# Patient Record
Sex: Male | Born: 1945 | Race: White | Hispanic: No | Smoking: Former smoker
Health system: Southern US, Community
[De-identification: ages and names within clinical notes are randomized; demographics above are authoritative.]

## PROBLEM LIST (undated history)

## (undated) DIAGNOSIS — I10 Essential (primary) hypertension: Secondary | ICD-10-CM

## (undated) DIAGNOSIS — J449 Chronic obstructive pulmonary disease, unspecified: Secondary | ICD-10-CM

## (undated) DIAGNOSIS — M1990 Unspecified osteoarthritis, unspecified site: Secondary | ICD-10-CM

## (undated) DIAGNOSIS — I429 Cardiomyopathy, unspecified: Secondary | ICD-10-CM

## (undated) DIAGNOSIS — M109 Gout, unspecified: Secondary | ICD-10-CM

## (undated) DIAGNOSIS — I4891 Unspecified atrial fibrillation: Secondary | ICD-10-CM

## (undated) DIAGNOSIS — I509 Heart failure, unspecified: Secondary | ICD-10-CM

## (undated) HISTORY — PX: APPENDECTOMY (OPEN): SHX54

## (undated) HISTORY — DX: Unspecified atrial fibrillation: I48.91

## (undated) HISTORY — DX: Chronic obstructive pulmonary disease, unspecified: J44.9

## (undated) HISTORY — PX: LIFT, ARM (MEDICAL): SHX4702

## (undated) HISTORY — DX: Cardiomyopathy, unspecified: I42.9

## (undated) HISTORY — DX: Gout, unspecified: M10.9

## (undated) HISTORY — DX: Unspecified osteoarthritis, unspecified site: M19.90

## (undated) HISTORY — DX: Essential (primary) hypertension: I10

## (undated) HISTORY — PX: KNEE ARTHROSCOPY W/ ACL RECONSTRUCTION: SHX1858

## (undated) HISTORY — PX: KNEE ARTHROSCOPY: SUR90

## (undated) HISTORY — PX: COLONOSCOPY W/ POLYPECTOMY: SHX1380

## (undated) HISTORY — DX: Heart failure, unspecified: I50.9

## (undated) HISTORY — PX: HX APPENDECTOMY: SHX54

## (undated) HISTORY — PX: GASTROSCOPY: WVUENDOPRO49

---

## 1989-08-07 ENCOUNTER — Emergency Department: Admit: 1989-08-07 | Payer: Self-pay | Source: Ambulatory Visit

## 1990-06-17 ENCOUNTER — Emergency Department: Admit: 1990-06-17 | Disposition: A | Discharge status: Home / Self Care | Payer: Self-pay | Source: Ambulatory Visit

## 1990-11-10 ENCOUNTER — Ambulatory Visit: Admit: 1990-11-10 | Disposition: A | Discharge status: Home / Self Care | Payer: Self-pay | Source: Ambulatory Visit

## 1991-11-13 ENCOUNTER — Emergency Department: Admit: 1991-11-13 | Disposition: A | Discharge status: Home / Self Care | Payer: Self-pay | Source: Ambulatory Visit

## 1992-02-28 ENCOUNTER — Ambulatory Visit (INDEPENDENT_AMBULATORY_CARE_PROVIDER_SITE_OTHER): Admit: 1992-02-28 | Disposition: A | Discharge status: Home / Self Care | Payer: Self-pay | Source: Ambulatory Visit

## 1998-03-15 ENCOUNTER — Emergency Department: Admit: 1998-03-15 | Payer: Self-pay | Source: Emergency Department | Admitting: Emergency Medical Services

## 2000-09-05 ENCOUNTER — Inpatient Hospital Stay
Admission: EM | Admit: 2000-09-05 | Disposition: A | Discharge status: Home / Self Care | Payer: Self-pay | Source: Ambulatory Visit

## 2001-07-01 ENCOUNTER — Emergency Department
Admission: EM | Admit: 2001-07-01 | Disposition: A | Discharge status: Home / Self Care | Payer: Self-pay | Source: Ambulatory Visit

## 2007-10-08 ENCOUNTER — Emergency Department
Admission: EM | Admit: 2007-10-08 | Disposition: A | Discharge status: Home / Self Care | Payer: Self-pay | Source: Ambulatory Visit

## 2011-06-13 ENCOUNTER — Ambulatory Visit: Admit: 2011-06-13 | Disposition: A | Discharge status: Home / Self Care | Payer: Self-pay | Source: Ambulatory Visit

## 2012-12-16 ENCOUNTER — Ambulatory Visit
Admission: RE | Admit: 2012-12-16 | Discharge: 2012-12-16 | Disposition: A | Discharge status: Home / Self Care | Payer: Medicare PPO | Source: Ambulatory Visit | Attending: Gastroenterology | Admitting: Gastroenterology

## 2012-12-16 ENCOUNTER — Ambulatory Visit (HOSPITAL_BASED_OUTPATIENT_CLINIC_OR_DEPARTMENT_OTHER): Payer: Medicare PPO | Admitting: Gastroenterology

## 2012-12-16 ENCOUNTER — Encounter (HOSPITAL_BASED_OUTPATIENT_CLINIC_OR_DEPARTMENT_OTHER)
Admission: RE | Disposition: A | Discharge status: Home / Self Care | Payer: Self-pay | Source: Ambulatory Visit | Attending: Gastroenterology

## 2012-12-16 ENCOUNTER — Encounter (HOSPITAL_BASED_OUTPATIENT_CLINIC_OR_DEPARTMENT_OTHER): Payer: Self-pay | Admitting: Anesthesiology

## 2012-12-16 ENCOUNTER — Ambulatory Visit (HOSPITAL_BASED_OUTPATIENT_CLINIC_OR_DEPARTMENT_OTHER): Payer: Medicare PPO | Admitting: Anesthesiology

## 2012-12-16 ENCOUNTER — Encounter (HOSPITAL_BASED_OUTPATIENT_CLINIC_OR_DEPARTMENT_OTHER): Payer: Self-pay

## 2012-12-16 DIAGNOSIS — G8929 Other chronic pain: Secondary | ICD-10-CM | POA: Insufficient documentation

## 2012-12-16 DIAGNOSIS — K269 Duodenal ulcer, unspecified as acute or chronic, without hemorrhage or perforation: Secondary | ICD-10-CM | POA: Insufficient documentation

## 2012-12-16 DIAGNOSIS — Z9089 Acquired absence of other organs: Secondary | ICD-10-CM | POA: Insufficient documentation

## 2012-12-16 DIAGNOSIS — I10 Essential (primary) hypertension: Secondary | ICD-10-CM | POA: Insufficient documentation

## 2012-12-16 DIAGNOSIS — K648 Other hemorrhoids: Secondary | ICD-10-CM | POA: Insufficient documentation

## 2012-12-16 DIAGNOSIS — K2940 Chronic atrophic gastritis without bleeding: Secondary | ICD-10-CM | POA: Insufficient documentation

## 2012-12-16 DIAGNOSIS — R1013 Epigastric pain: Secondary | ICD-10-CM | POA: Insufficient documentation

## 2012-12-16 DIAGNOSIS — A048 Other specified bacterial intestinal infections: Secondary | ICD-10-CM | POA: Insufficient documentation

## 2012-12-16 DIAGNOSIS — Z1211 Encounter for screening for malignant neoplasm of colon: Secondary | ICD-10-CM | POA: Insufficient documentation

## 2012-12-16 DIAGNOSIS — Z79899 Other long term (current) drug therapy: Secondary | ICD-10-CM | POA: Insufficient documentation

## 2012-12-16 DIAGNOSIS — K2980 Duodenitis without bleeding: Secondary | ICD-10-CM | POA: Insufficient documentation

## 2012-12-16 DIAGNOSIS — F17200 Nicotine dependence, unspecified, uncomplicated: Secondary | ICD-10-CM | POA: Insufficient documentation

## 2012-12-16 DIAGNOSIS — D126 Benign neoplasm of colon, unspecified: Secondary | ICD-10-CM | POA: Insufficient documentation

## 2012-12-16 HISTORY — PX: COLONOSCOPY, POLYPECTOMY: SHX3473

## 2012-12-16 HISTORY — PX: EGD, BIOPSY: SHX3796

## 2012-12-16 SURGERY — COLONOSCOPY, REMOVAL OF TUMOR, POLYP, OR OTHER LESION BY SNARE TECHNIQUE
Anesthesia: Anesthesia MAC / Sedation | Site: Abdomen | Wound class: Clean Contaminated

## 2012-12-16 MED ORDER — PROPOFOL 10 MG/ML IV EMUL
INTRAVENOUS | Status: AC
Start: 2012-12-16 — End: ?
  Filled 2012-12-16: qty 20

## 2012-12-16 MED ORDER — FENTANYL CITRATE 0.05 MG/ML IJ SOLN
INTRAMUSCULAR | Status: AC
Start: 2012-12-16 — End: ?
  Filled 2012-12-16: qty 2

## 2012-12-16 MED ORDER — SODIUM CHLORIDE 0.9 % IV SOLN
INTRAVENOUS | Status: DC | PRN
Start: 2012-12-16 — End: 2012-12-16

## 2012-12-16 MED ORDER — SODIUM CHLORIDE 0.9 % IV SOLN
INTRAVENOUS | Status: DC
Start: 2012-12-16 — End: 2012-12-16

## 2012-12-16 MED ORDER — PROPOFOL 10 MG/ML IV EMUL
INTRAVENOUS | Status: DC | PRN
Start: 2012-12-16 — End: 2012-12-16
  Administered 2012-12-16 (×6): 30 mg via INTRAVENOUS
  Administered 2012-12-16: 50 mg via INTRAVENOUS
  Administered 2012-12-16 (×3): 30 mg via INTRAVENOUS
  Administered 2012-12-16: 50 mg via INTRAVENOUS
  Administered 2012-12-16: 30 mg via INTRAVENOUS

## 2012-12-16 MED ORDER — FENTANYL CITRATE 0.05 MG/ML IJ SOLN
INTRAMUSCULAR | Status: DC | PRN
Start: 2012-12-16 — End: 2012-12-16
  Administered 2012-12-16: 100 ug via INTRAVENOUS

## 2012-12-16 SURGICAL SUPPLY — 4 items
CONN IRRG TORRENT 1WAYVAL ENDO (Supply) ×2 IMPLANT
FORCEP BIOPSY RAD JAW 1333-40 (Supply) ×2 IMPLANT
FORCEP RADIAL JAW JUMBO 240CM (Supply) ×2 IMPLANT
TUBING IRRIGATION TORRENT (Supply) ×2 IMPLANT

## 2012-12-16 NOTE — Anesthesia Preprocedure Evaluation (Signed)
Anesthesia Evaluation    AIRWAY    Mallampati: II    TM distance: >3 FB  Neck ROM: full  Mouth Opening:full   CARDIOVASCULAR    cardiovascular exam normal, regular and normal       DENTAL         PULMONARY    pulmonary exam normal     OTHER FINDINGS    Missing and poor dentition                  Anesthesia Plan    ASA 2     MAC               (Goes by LB)      intravenous induction   Detailed anesthesia plan: MAC        Post op pain management: per surgeon    informed consent obtained

## 2012-12-16 NOTE — Final Progress Note (DC Note for stay less than 48 (Signed)
DISCHARGE NOTE:  Patient underwent the following endoscopic procedures with details of results recorded below:    PROCEDURE: COLONOSCOPY    IMPRESSION:  - Two 10 to 12 mm polyps in the sigmoid colon and in the descending colon.  Resected and retrieved.   - The examined portion of the ileum was normal.   - Three 7 to 10 mm polyps in the sigmoid colon, in the ascending colon and in the cecum.  Resected and retrieved.   - Two 3 to 4 mm polyps in the sigmoid colon.  Resected and retrieved.   - Non-bleeding internal hemorrhoids.      RECOMMENDATIONS:  - Await histopathology results  - If any of the polyps are adenomatous, then I recommend:   *Colonoscopy in 3-5 years   *Colonoscopy for all first degree family members, beginning at age 4   *If family members need access to colonoscopy services, call [phone],                     extension [phone] to speak to [place].   - If none of the polyps are adenomatous, then colonoscopy is needed in 10 years  - I will contact him in about 14 days with the histology results and final recommendations.   - Incidental diverticulosis was found. No therapy for this condition is needed.  Some providers suggest that adding fiber reduces future complications, but this is not supported by rigid scientific criteria.  I recommend fiber only if the patient has crampy abdominal pain.   - The hemorrhoids found on today's examination require no intervention or therapy.  - Upper GI endoscopy now      PROCEDURE: UPPER GI ENDOSCOPY    IMPRESSION:  - Normal 2nd part of the duodenum.  Biopsied.   - Multiple duodenal ulcers with clean base.   - Acute duodenitis.   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   - Remaining stomach.  Biopsied.   - Questionable early varicies?  - Normal esophagus otherwise    RECOMMENDATIONS:  - Discharge to home via wheelchair after meeting discharge criteria  - Await pathology results  - I will contact her via letter about the histopathology results.   - Consider PPI for  therapy of ulcer, if symptoms demand it  - No further evaluation is needed at this time.   - See me in my office on an as needed basis.  - Return to primary care/referring provider as previously arranged.       After a period of observation and recovery from sedation, the patient was discharged to home in stable condition.

## 2012-12-16 NOTE — Brief Op Note (Signed)
An image copy of the detailed note for this endoscopic procedure is located under "MEDIA" or possibly "OP NOTES" section with same date and time.    Below you will find a summary of impressions and plan as a ready reference:    PROCEDURE: UPPER GI ENDOSCOPY    IMPRESSION:  - Normal 2nd part of the duodenum.  Biopsied.   - Multiple duodenal ulcers with clean base.   - Acute duodenitis.   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   - Remaining stomach.  Biopsied.   - Questionable early varicies?  - Normal esophagus otherwise    RECOMMENDATIONS:  - Discharge to home via wheelchair after meeting discharge criteria  - Await pathology results  - I will contact her via letter about the histopathology results.   - Consider PPI for therapy of ulcer, if symptoms demand it  - No further evaluation is needed at this time.   - See me in my office on an as needed basis.  - Return to primary care/referring provider as previously arranged.

## 2012-12-16 NOTE — Transfer of Care (Signed)
Anesthesia Transfer of Care Note    Patient: Christopher Cousineau Sr.    Procedures performed: Procedure(s) with comments:  COLONOSCOPY, POLYPECTOMY  EGD, BIOPSY    Anesthesia type: MAC    Patient location:  Endo Recovery    Last vitals:   Filed Vitals:    12/16/12 0815   BP: 163/106   Pulse: 85   Temp:    Resp: 12   SpO2: 96%       Post pain: Patient not complaining of pain, continue current therapy     Mental Status:  awake    Respiratory Function: stable    Cardiovascular: stable    Nausea/Vomiting: patient not complaining of nausea or vomiting    Hydration Status: adequate    Post assessment: no apparent anesthetic complications, no reportable events and no evidence of recall

## 2012-12-16 NOTE — Brief Op Note (Signed)
An image copy of the detailed note for this endoscopic procedure is located under "MEDIA" or possibly "OP NOTES" section with same date and time.    Below you will find a summary of impressions and plan as a ready reference:    PROCEDURE: COLONOSCOPY    IMPRESSION:  - Two 10 to 12 mm polyps in the sigmoid colon and in the descending colon.  Resected and retrieved.   - The examined portion of the ileum was normal.   - Three 7 to 10 mm polyps in the sigmoid colon, in the ascending colon and in the cecum.  Resected and retrieved.   - Two 3 to 4 mm polyps in the sigmoid colon.  Resected and retrieved.   - Non-bleeding internal hemorrhoids.      RECOMMENDATIONS:  - Await histopathology results  - If any of the polyps are adenomatous, then I recommend:   *Colonoscopy in 3-5 years   *Colonoscopy for all first degree family members, beginning at age 22   *If family members need access to colonoscopy services, call [phone],                     extension [phone] to speak to [place].   - If none of the polyps are adenomatous, then colonoscopy is needed in 10 years  - I will contact him in about 14 days with the histology results and final recommendations.   - Incidental diverticulosis was found. No therapy for this condition is needed.  Some providers suggest that adding fiber reduces future complications, but this is not supported by rigid scientific criteria.  I recommend fiber only if the patient has crampy abdominal pain.   - The hemorrhoids found on today's examination require no intervention or therapy.  - Upper GI endoscopy now

## 2012-12-16 NOTE — Anesthesia Postprocedure Evaluation (Signed)
Post Operative Evaluation    Patient: Christopher Tomasetti Sr.    Procedures performed: Procedure(s) with comments:  COLONOSCOPY, POLYPECTOMY  EGD, BIOPSY    Patient location: PACU    Last vitals:   Filed Vitals:    12/16/12 0852   BP: 90/52   Pulse: 68   Temp:    Resp: 20   SpO2: 98%       Post pain: Continue current therapy     Mental Status:  Adequately recovered from anesthesia.    Respiratory Function: Adequate    Cardiovascular: Stable    Nausea/Vomiting: Nausea therapy adequate    Hydration Status: Adequate    Post assessment: No apparent anesthetic complications, no reportable events and no evidence of recall

## 2012-12-16 NOTE — Discharge Instructions (Addendum)
Endoscopy Discharge Instructions  General Instructions:  1. Following sedation, your judgement, perception, and coordination are considered impaired. Even though you may feel awake and alert, you are considered legally intoxicated. Therefore, until the next morning;   Do not Drive   Do not operate appliances or equipment that requires reaction time (e.g. stove, electrical tools, machinery)   Do not sign legal documents or be involved in important decisions.   Do not smoke if alone   Do not drink alcoholic beverages   Go directly home and rest for several hours before resuming your routine activities.   It is highly recommended to have a responsible adult stay with you for the next 24 hours    2. Tenderness, swelling or pain may occur at the IV site where you received sedation. If you experience this, apply warm soaks to the area. Notify your physician if this persists.    3. If biopsies and/or polypectomies were performed, your physician should receive results in 3-4 business days.  After the results are received, your physician will prepare a letter explaining significance of the results and any new recommendations.  Since it takes time to analyze the results, draft a letter and mail it to you, it typically takes about 5-7 business days for you to receive that letter.    4. If you take any medication that thins the blood (some examples include; Aspirin, Coumadin, Pradaxa, Xaretlo, Plavix), you should resume that medication routinely as of today. This does increase your bleeding risk to a small extent, but it is safer than developing a stroke or heart attack.     5. All the recommendations that your physician made regarding your procedure are recorded on the bottom of the procedure report.  We gave you a copy of this report prior to your discharge.  Please refer to that report for any additional recommendations!!    Instructions Specific To Procedures - Report To Physician Any Of The Following:    Upper  Endoscopy, ERCP, Dilations   1. Pain in Chest   2. Nausea/vomitting   3. Fevers/Chills within 24 hours after procedure. Temp>101deg F   4. Severe and persistent abdominal pain and bloating     In Addition:   Mild throat soreness may follow this procedure. Warm salt water gargling or    lozenges of your choice will most likely relieve your discomfort or cold drinks and   popsicles.     Colon/Sigmoidoscopy/Proctoscopy   1. Severe and persistent abdominal pain/bloating which does not subside within   2-3 hours   2. Large amount of rectal bleeding (some mucosal blood streaking may occur,   especially if biopsy or polypectomy was done or if hemorrhoids are present.   3. Nausea/vomitting   4. Fevers/Chills within 24 hours after procedure. Temp>101deg F      If you have questions or problems contact your MD immediately. If you need immediate attention, call your MD, 911 and/or go to nearest emergency room.    [facility]  Colonscopy/Upper Endoscopy Discharge Instructions    Dear Patient,  Thank you for allowing [place] to be a part of your health care experience.  We realize you may not fully recall your discharge care.  The following information is to guide you.    A.  After you leave the hospital:   1.  Due to the effects of the sedatives, you may feel tired for the remainder of today.   2.  DO NOT DRIVE OR OPERATE HAZARDOUS  MACHINERY until tomorrow.   3.  Please rest and drink extra fluids today.  Avoid alcohol today.   4.  Resume your normal diet as tolerated.   5.  A sore throat or hoarse throat is normal.  Gargle with warm salt water or use a throat lozenge.   6.  If you experience anal soreness; you may apply a soothing ointment of your choice.   7.  It is normal not to have a bowel movement for 1-2 days after a colonoscopy.   8.  Resume your normal activities tomorrow.    B.  If you experience any of the following danger signs or symptoms, call your doctor immediately:  After business hours call  [phone] for [facility] for physician guidance.   1.  Vomiting blood, shortness of breath, pain spreading throughout the chest with swallowing   2.  Rectal bleeding (fresh onset of continual clotted blood).   3.  Passing obvious blood in the stool repeatedly or a change of stool consistency and color (black).   4.  Redness or swelling at the IV insertion site that continues or worsens over 2-3 days.  Initial warm compresses may be helpful.   5.  New onset of distention or pain, that does not go away or prevents you from normal daily activity.    C.  Follow-up Care:      D.  Medication:  Resume home medications     Discontinued/Held Medications:        New Prescription Medications:     ***    E.  Additional comments or instructions:  ***    Signing this means I understand the above and have received a copy.

## 2014-04-29 ENCOUNTER — Observation Stay: Payer: Medicare PPO | Admitting: Emergency Medicine

## 2014-04-29 ENCOUNTER — Observation Stay
Admission: EM | Admit: 2014-04-29 | Discharge: 2014-04-30 | Disposition: A | Discharge status: Home / Self Care | Payer: Medicare PPO | Attending: Emergency Medicine | Admitting: Emergency Medicine

## 2014-04-29 ENCOUNTER — Emergency Department: Payer: Medicare PPO

## 2014-04-29 DIAGNOSIS — K2940 Chronic atrophic gastritis without bleeding: Secondary | ICD-10-CM | POA: Insufficient documentation

## 2014-04-29 DIAGNOSIS — Z2821 Immunization not carried out because of patient refusal: Secondary | ICD-10-CM | POA: Insufficient documentation

## 2014-04-29 DIAGNOSIS — R1110 Vomiting, unspecified: Secondary | ICD-10-CM

## 2014-04-29 DIAGNOSIS — R100 Acute abdomen: Secondary | ICD-10-CM

## 2014-04-29 DIAGNOSIS — Z7901 Long term (current) use of anticoagulants: Secondary | ICD-10-CM | POA: Insufficient documentation

## 2014-04-29 DIAGNOSIS — I482 Chronic atrial fibrillation, unspecified: Secondary | ICD-10-CM

## 2014-04-29 DIAGNOSIS — Z9049 Acquired absence of other specified parts of digestive tract: Secondary | ICD-10-CM | POA: Insufficient documentation

## 2014-04-29 DIAGNOSIS — F17210 Nicotine dependence, cigarettes, uncomplicated: Secondary | ICD-10-CM | POA: Insufficient documentation

## 2014-04-29 DIAGNOSIS — I10 Essential (primary) hypertension: Secondary | ICD-10-CM | POA: Insufficient documentation

## 2014-04-29 DIAGNOSIS — K2980 Duodenitis without bleeding: Secondary | ICD-10-CM | POA: Insufficient documentation

## 2014-04-29 DIAGNOSIS — R109 Unspecified abdominal pain: Principal | ICD-10-CM | POA: Insufficient documentation

## 2014-04-29 DIAGNOSIS — K269 Duodenal ulcer, unspecified as acute or chronic, without hemorrhage or perforation: Secondary | ICD-10-CM | POA: Insufficient documentation

## 2014-04-29 LAB — BASIC METABOLIC PANEL
Anion Gap: 16.5 mMol/L (ref 7.0–18.0)
BUN / Creatinine Ratio: 17.4 Ratio (ref 10.0–30.0)
BUN: 19 mg/dL (ref 7–22)
CO2: 23.4 mMol/L (ref 20.0–30.0)
Calcium: 9 mg/dL (ref 8.5–10.5)
Chloride: 104 mMol/L (ref 98–110)
Creatinine: 1.09 mg/dL (ref 0.80–1.30)
EGFR: >60 mL/min/{1.73_m2}
Glucose: 121 mg/dL — ABNORMAL HIGH (ref 70–99)
Osmolality Calc: 283 mOsm/kg (ref 275–300)
Potassium: 3.9 mMol/L (ref 3.5–5.3)
Sodium: 140 mMol/L (ref 136–147)

## 2014-04-29 LAB — URINALYSIS
Bilirubin, UA: NEGATIVE
Blood, UA: NEGATIVE
Glucose, UA: NEGATIVE mg/dL
Ketones UA: NEGATIVE mg/dL
Leukocyte Esterase, UA: NEGATIVE Leu/uL
Nitrite, UA: NEGATIVE
Protein, UR: NEGATIVE mg/dL
RBC, UA: 5 /hpf — ABNORMAL HIGH (ref 0–4)
Urine Specific Gravity: 1.018 (ref 1.001–1.040)
Urobilinogen, UA: NORMAL mg/dL
WBC, UA: <1 /hpf (ref 0–4)
pH, Urine: 6 pH (ref 5.0–8.0)

## 2014-04-29 LAB — HEPATIC FUNCTION PANEL
ALT: 18 U/L (ref 0–55)
AST (SGOT): 13 U/L (ref 10–42)
Albumin/Globulin Ratio: 1.3 Ratio (ref 0.70–1.50)
Albumin: 3.9 gm/dL (ref 3.5–5.0)
Alkaline Phosphatase: 97 U/L (ref 40–145)
Bilirubin Direct: 0.3 mg/dL (ref 0.0–0.3)
Bilirubin, Total: 0.6 mg/dL (ref 0.1–1.2)
Globulin: 3 gm/dL (ref 2.0–4.0)
Protein, Total: 6.9 gm/dL (ref 6.0–8.3)

## 2014-04-29 LAB — CBC AND DIFFERENTIAL
Basophils %: 0.4 % (ref 0.0–3.0)
Basophils Absolute: 0 10*3/uL (ref 0.0–0.3)
Eosinophils %: 1.3 % (ref 0.0–7.0)
Eosinophils Absolute: 0.1 10*3/uL (ref 0.0–0.8)
Hematocrit: 48.7 % (ref 39.0–52.5)
Hemoglobin: 16.4 gm/dL (ref 13.0–17.5)
Lymphocytes Absolute: 1.3 10*3/uL (ref 0.6–5.1)
Lymphocytes: 11.6 % — ABNORMAL LOW (ref 15.0–46.0)
MCH: 30 pg (ref 28–35)
MCHC: 34 gm/dL (ref 32–36)
MCV: 90 fL (ref 80–100)
MPV: 7.9 fL (ref 6.0–10.0)
Monocytes Absolute: 0.6 10*3/uL (ref 0.1–1.7)
Monocytes: 5.5 % (ref 3.0–15.0)
Neutrophils %: 81.2 % — ABNORMAL HIGH (ref 42.0–78.0)
Neutrophils Absolute: 9.3 10*3/uL — ABNORMAL HIGH (ref 1.7–8.6)
PLT CT: 250 10*3/uL (ref 130–440)
RBC: 5.38 10*6/uL (ref 4.00–5.70)
RDW: 12.9 % (ref 11.0–14.0)
WBC: 11.4 10*3/uL — ABNORMAL HIGH (ref 4.0–11.0)

## 2014-04-29 LAB — LIPASE: Lipase: 14 U/L (ref 8–78)

## 2014-04-29 MED ORDER — SODIUM CHLORIDE 0.9 % IJ SOLN
20.0000 mg | Freq: Two times a day (BID) | INTRAVENOUS | Status: DC
Start: 2014-04-29 — End: 2014-04-30
  Administered 2014-04-29: 20 mg via INTRAVENOUS
  Filled 2014-04-29 (×3): qty 2

## 2014-04-29 MED ORDER — SODIUM CHLORIDE 0.9 % IV BOLUS
1000.0000 mL | Freq: Once | INTRAVENOUS | Status: AC
Start: 2014-04-29 — End: 2014-04-29
  Administered 2014-04-29: 1000 mL via INTRAVENOUS

## 2014-04-29 MED ORDER — ONDANSETRON HCL 4 MG/2ML IJ SOLN
INTRAMUSCULAR | Status: AC
Start: 2014-04-29 — End: ?
  Filled 2014-04-29: qty 2

## 2014-04-29 MED ORDER — ONDANSETRON HCL 4 MG/2ML IJ SOLN
4.0000 mg | Freq: Once | INTRAMUSCULAR | Status: AC
Start: 2014-04-29 — End: 2014-04-29
  Administered 2014-04-29: 4 mg via INTRAVENOUS

## 2014-04-29 MED ORDER — HEPARIN SODIUM (PORCINE) PF 5000 UNIT/0.5ML IJ SOLN
5000.0000 [IU] | Freq: Two times a day (BID) | INTRAMUSCULAR | Status: DC
Start: 2014-04-29 — End: 2014-04-29
  Administered 2014-04-29: 5000 [IU] via SUBCUTANEOUS
  Filled 2014-04-29 (×3): qty 0.5

## 2014-04-29 MED ORDER — PROMETHAZINE HCL 12.5 MG RE SUPP
12.5000 mg | Freq: Four times a day (QID) | RECTAL | Status: DC | PRN
Start: 2014-04-29 — End: 2014-04-30

## 2014-04-29 MED ORDER — SODIUM CHLORIDE 0.9 % IV SOLN
INTRAVENOUS | Status: DC
Start: 2014-04-29 — End: 2014-04-30

## 2014-04-29 MED ORDER — ONDANSETRON 4 MG PO TBDP
4.0000 mg | ORAL_TABLET | Freq: Three times a day (TID) | ORAL | Status: DC | PRN
Start: 2014-04-29 — End: 2014-04-30

## 2014-04-29 MED ORDER — PROMETHAZINE HCL 25 MG PO TABS
25.0000 mg | ORAL_TABLET | Freq: Four times a day (QID) | ORAL | Status: DC | PRN
Start: 2014-04-29 — End: 2014-04-30

## 2014-04-29 MED ORDER — IOHEXOL 350 MG/ML IV SOLN
100.0000 mL | Freq: Once | INTRAVENOUS | Status: AC | PRN
Start: 2014-04-29 — End: 2014-04-29
  Administered 2014-04-29: 100 mL via INTRAVENOUS

## 2014-04-29 MED ORDER — VH HYDROMORPHONE HCL PF 1 MG/ML CARPUJECT
1.0000 mg | Freq: Once | INTRAMUSCULAR | Status: AC
Start: 2014-04-29 — End: 2014-04-29
  Administered 2014-04-29: 1 mg via INTRAVENOUS

## 2014-04-29 MED ORDER — VH PROMETHAZINE HCL 25 MG/ML IM SOLN
6.2500 mg | Freq: Four times a day (QID) | INTRAMUSCULAR | Status: DC | PRN
Start: 2014-04-29 — End: 2014-04-30

## 2014-04-29 MED ORDER — LISINOPRIL 40 MG PO TABS
40.0000 mg | ORAL_TABLET | Freq: Every day | ORAL | Status: DC
Start: 2014-04-30 — End: 2014-04-30
  Administered 2014-04-30: 40 mg via ORAL
  Filled 2014-04-29: qty 1

## 2014-04-29 MED ORDER — ONDANSETRON HCL 4 MG/2ML IJ SOLN
4.0000 mg | Freq: Three times a day (TID) | INTRAMUSCULAR | Status: DC | PRN
Start: 2014-04-29 — End: 2014-04-30

## 2014-04-29 MED ORDER — ACETAMINOPHEN 325 MG PO TABS
650.0000 mg | ORAL_TABLET | ORAL | Status: DC | PRN
Start: 2014-04-29 — End: 2014-04-30

## 2014-04-29 MED ORDER — AMLODIPINE BESYLATE 5 MG PO TABS
5.0000 mg | ORAL_TABLET | Freq: Every day | ORAL | Status: DC
Start: 2014-04-30 — End: 2014-04-30
  Administered 2014-04-30: 5 mg via ORAL
  Filled 2014-04-29: qty 1

## 2014-04-29 MED ORDER — HYDROCHLOROTHIAZIDE 12.5 MG PO TABS
12.5000 mg | ORAL_TABLET | Freq: Every day | ORAL | Status: DC
Start: 2014-04-30 — End: 2014-04-30
  Administered 2014-04-30: 12.5 mg via ORAL
  Filled 2014-04-29: qty 1

## 2014-04-29 MED ORDER — VH HYDROMORPHONE HCL PF 1 MG/ML CARPUJECT
0.5000 mg | INTRAMUSCULAR | Status: DC | PRN
Start: 2014-04-29 — End: 2014-04-30
  Administered 2014-04-29: 0.5 mg via INTRAVENOUS
  Filled 2014-04-29: qty 1

## 2014-04-29 MED ORDER — RIVAROXABAN 20 MG PO TABS
20.0000 mg | ORAL_TABLET | Freq: Every day | ORAL | Status: DC
Start: 2014-04-30 — End: 2014-04-30
  Administered 2014-04-30: 20 mg via ORAL
  Filled 2014-04-29: qty 1

## 2014-04-29 MED ORDER — VH HYDROMORPHONE HCL 1 MG/ML (NARRATOR)
INTRAMUSCULAR | Status: AC
Start: 2014-04-29 — End: ?
  Filled 2014-04-29: qty 1

## 2014-04-29 NOTE — H&P (Signed)
[facility] EMERGENCY DEPARTMENT History and Physical Exam      Patient Name: Christopher Dickerson, Christopher SR.  Encounter Date:  04/29/2014  Attending Physician: Myna Hidalgo, MD  PCP: Tanna Savoy, MD  Patient DOB:  [birth year]  MRN:  [id number]  Room:  C13/C13-A    History of Presenting Illness     Chief complaint: Abdominal Pain    HPI/ROS is limited by: none    Christopher Fenton Sr. is a 69 y.o. male who presents with abdominal pain. Patient said that it woke him up at 3 this morning. He said he went to bed late last night watching the ball drop for New Year's eve. He then watched some old Western's. The patient said that he had dry heaves. No diarrhea. No fever. No blood in the stool. He's had a previous appendectomy. He had a stool this morning. The patient was seen in the emergency room for his CT that was unremarkable course Dr. Edson Snowball blood. His white count liver tests are normal. The patient denies a heartburn after eating. He has some dizziness. Patient's said that he was having similar symptoms that lasted about a day before his colonoscopy 2 years ago. He said afterwards his belly pain got better.    . The last several months however the the discomfort has been recurring. It measured gradually comes on. He was concerned about his gallbladder because his sister had similar belly problems and they took out her gallbladder and it relieved her symptoms. The patient gets early satiety. He denies any dysphasia. He has a history of hypertension for which she sees Dr. Leonette Monarch. The patient denies a weight change. No melena. The patient was given pain medicine in the emergency room and he said it made him dizzy.      Review of Systems   12 point review of system was negative except for was listed as present illness.    Allergies     Pt has No Known Allergies.    Medications     Current Outpatient Rx   Name  Route  Sig  Dispense  Refill   . amLODIPine (NORVASC) 5 MG tablet    Oral    Take 5 mg by mouth daily.                 . benzonatate (TESSALON) 100 MG capsule    Oral    Take 100 mg by mouth 3 (three) times daily as needed.                . hydrochlorothiazide (MICROZIDE) 12.5 MG capsule    Oral    Take 12.5 mg by mouth every morning.                [name] lisinopril (PRINIVIL,ZESTRIL) 40 MG tablet    Oral    Take 40 mg by mouth daily.                [name] omeprazole (PRILOSEC) 40 MG capsule    Oral    Take 40 mg by mouth daily.                Carlena Hurl 20 MG Tab    Oral    Take 20 mg by mouth daily.                  Dispense as written.     [name] DISCONTD: warfarin (COUMADIN) 5 MG tablet  Past Medical History     Pt has a past medical history of Hypertension.    Past Surgical History     Pt has past surgical history that includes LIFT, ARM (MEDICAL); Knee arthroscopy w/ ACL reconstruction; Appendectomy; COLONOSCOPY, POLYPECTOMY (12/16/2012); and EGD, BIOPSY (12/16/2012).    Family History     The family history is not on file.    Social History     Pt reports that he has been smoking Cigarettes.  He has been smoking about 0.50 packs per day. He has never used smokeless tobacco. He reports that he drinks alcohol. He reports that he does not use illicit drugs.    Physical Exam     Blood pressure 157/103, pulse 105, temperature 97.7 F (36.5 C), resp. rate 20, weight 100.5 kg (221 lb 9 oz), SpO2 97 %.    On examination patient's awake alert no acute distress    Eyes extraocular eyes are normal.    Ears: External ears are normal.    Mouth: Mucous members are dry.    Neck: Neck is supple no adenopathy palpated    Heart: Heart regular rate rhythm no murmurs or gallops    Lungs: Lungs are clear breath sounds no wheezing or rhonchi    Abdomen: Abdomen is soft, slight distention. There is no tenderness on my examination.    Extremities: No clubbing cyanosis or edema    Skin: No rash.    Lymphatic: No adenopathy palpated    Neurologic: No focal findings    Psychiatric: Normal mood.  Orders Placed     Orders Placed  This Encounter   Procedures   . CT Abdomen Pelvis with IV Cont   . CBC and differential   . Urinalysis   . Basic Metabolic Panel   . Hepatic function panel (LFT)   . Lipase   . Saline lock IV   . [facility] ED Bed Request       Diagnostic Results     The results of the diagnostic studies below have been reviewed by myself:    Labs  Results     Procedure Component Value Units Date/Time    Urinalysis [[id number]]  (Abnormal) Collected:  04/29/14 1416    Specimen Information:  Urine, Random / Urine, Clean Catch Updated:  04/29/14 1439     Color, UA Yellow      Clarity, UA Clear      Specific Gravity, UR 1.018      pH, Urine 6.0 pH      Protein, UR Negative mg/dL      Glucose, UA Negative mg/dL      Ketones UA Negative mg/dL      Bilirubin, UA Negative      Blood, UA Negative      Nitrite, UA Negative      Urobilinogen, UA Normal mg/dL      Leukocyte Esterase, UA Negative Leu/uL      WBC, UA <1 /hpf      RBC, UA 5 (H) /hpf      Hyaline Casts, UA 0-2 (A) /lpf     Basic Metabolic Panel [[id number]]  (Abnormal) Collected:  04/29/14 1211    Specimen Information:  Plasma Updated:  04/29/14 1328     Sodium 140 mMol/L      Potassium 3.9 mMol/L      Chloride 104 mMol/L      CO2 23.4 mMol/L      CALCIUM 9.0 mg/dL      Glucose 865 (H)  mg/dL      Creatinine 4.40 mg/dL      BUN 19 mg/dL      Anion Gap 34.7 mMol/L      BUN/Creatinine Ratio 17.4 Ratio      EGFR >60 mL/min/1.76m2      Osmolality Calc 283 mOsm/kg     Hepatic function panel (LFT) [[id number]] Collected:  04/29/14 1211    Specimen Information:  Plasma Updated:  04/29/14 1328     Protein, Total 6.9 gm/dL      Albumin 3.9 gm/dL      Alkaline Phosphatase 97 U/L      ALT 18 U/L      AST (SGOT) 13 U/L      Bilirubin, Total 0.6 mg/dL      Bilirubin, Direct 0.3 mg/dL      Albumin/Globulin Ratio 1.30 Ratio      Globulin 3.0 gm/dL     Lipase [[id number]] Collected:  04/29/14 1211    Specimen Information:  Plasma Updated:  04/29/14 1328     Lipase 14 U/L     CBC and differential [[id number]]   (Abnormal) Collected:  04/29/14 1211    Specimen Information:  Blood / Blood Updated:  04/29/14 1309     WBC 11.4 (H) K/cmm      RBC 5.38 M/cmm      Hemoglobin 16.4 gm/dL      Hematocrit 01.6 %      MCV 90 fL      MCH 30 pg      MCHC 34 gm/dL      RDW 01.0 %      PLT CT 250 K/cmm      MPV 7.9 fL      NEUTROPHIL % 81.2 (H) %      Lymphocytes 11.6 (L) %      Monocytes 5.5 %      Eosinophils % 1.3 %      Basophils % 0.4 %      Neutrophils Absolute 9.3 (H) K/cmm      Lymphocytes Absolute 1.3 K/cmm      Monocytes Absolute 0.6 K/cmm      Eosinophils Absolute 0.1 K/cmm      BASO Absolute 0.0 K/cmm           Radiologic Studies  Radiology Results (24 Hour)     Procedure Component Value Units Date/Time    CT Abdomen Pelvis with IV Cont [[id number]] Collected:  04/29/14 1415    Order Status:  Completed Updated:  04/29/14 1422    Narrative:      Clinical History:  Abdominal pain    Technique:  CT of abdomen and pelvis with intravenous contrast obtained. Multiplanar reconstructions performed.    Contrast:  100 ML Omnipaque 350 intravenous    Comparison:  abdominal ultrasound dated June 13, 2011    Findings:    Lower chest:  The lung bases are clear. No pleural effusions.    Abdomen:  The liver, gallbladder, spleen, pancreas, and adrenals are unremarkable. Cysts and other too small to characterize  hypodensities are noted in the kidneys bilaterally. There is no hydronephrosis.    There is no evidence of small bowel obstruction. The appendix is surgically absent. There is no appreciable colonic wall  thickening or pericolonic inflammatory stranding. No pathologically enlarged lymph nodes are seen in the abdomen. There  is no free intraperitoneal air. Atherosclerotic disease is noted in the abdominal aorta.    Pelvis:  The prostate is enlarged and indents upon  the posterior wall of the urinary bladder. The urinary bladder itself appears  unremarkable. There is no free fluid or pelvic adenopathy.     Bones and Soft Tissues:  No  aggressive osseous lesion is identified. Facet arthropathy changes are noted in the lower lumbar spine.      Impression:      No evidence of acute inflammatory process in the abdomen or pelvis.        ReadingStation:[id number]          EKG: none      MDM    Impression: Nausea, dry heaves, abdominal discomfort earlier. Differential includes gastritis, recurrent ulcer, gallbladder dysfunction, irritable bowel. 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 pylori. We will treat him conservatively. Given a referral to GI.      Diagnosis / Disposition     Clinical Impression  1. Acute abdominal pain    2. Chronic atrial fibrillation    3. Chronic anticoagulation    4. Dry heaves    5. Essential hypertension        Disposition  ED Disposition     Admit Bed Type: OBS Unit [31]  Admitting Physician: Myna Hidalgo [[id number]  Patient Class: Observation [104]            Prescriptions  New Prescriptions    No medications on file

## 2014-04-29 NOTE — Plan of Care (Signed)
Problem: Safety  Goal: Patient will be free from injury during hospitalization  Outcome: Progressing

## 2014-04-29 NOTE — ED Provider Notes (Signed)
Physician/Midlevel provider first contact with patient: 04/29/14 1158         History     Chief Complaint   Patient presents with   . Abdominal Pain     Patient is a 69 y.o. male presenting with abdominal pain. The history is provided by the patient.   Abdominal Pain  Pain location:  Periumbilical (Diffuse abdominal pain 0600 constant dull, occasionally sharp pain)  Pain quality: dull and sharp    Pain radiates to:  Does not radiate  Pain severity:  Moderate  Onset quality:  Gradual  Timing:  Constant  Progression:  Worsening  Chronicity:  New  Context: previous surgery    Context: not awakening from sleep, not diet changes, not eating, not recent illness, not recent travel, not sick contacts and not trauma    Context comment:  Appendectomy in past  Relieved by:  Nothing  Worsened by:  Nothing tried  Ineffective treatments:  None tried  Associated symptoms: no anorexia, no chest pain, no constipation, no cough, no diarrhea, no dysuria, no fever, no hematemesis, no hematochezia, no hematuria, no melena, no nausea, no shortness of breath, no sore throat and no vomiting    Risk factors: no alcohol abuse, not elderly, has not had multiple surgeries, not obese and no recent hospitalization             Past Medical History   Diagnosis Date   . Hypertension        Past Surgical History   Procedure Laterality Date   . Lift, arm (medical)     . Knee arthroscopy w/ acl reconstruction     . Appendectomy     . Colonoscopy, polypectomy  12/16/2012     Procedure: COLONOSCOPY, POLYPECTOMY;  Surgeon: Gwenith Spitz, MD;  Location: [facility];  Service: Gastroenterology;  Laterality: N/A;   . Egd, biopsy  12/16/2012     Procedure: EGD, BIOPSY;  Surgeon: Gwenith Spitz, MD;  Location: [facility];  Service: Gastroenterology;  Laterality: N/A;       History reviewed. No pertinent family history.    Social  History   Substance Use Topics   . Smoking status: Current Every Day Smoker -- 0.50 packs/day     Types: Cigarettes    . Smokeless tobacco: Never Used   . Alcohol Use: Yes      Comment: occasional       .     No Known Allergies    Current Discharge Medication List      CONTINUE these medications which have NOT CHANGED    Details   amLODIPine (NORVASC) 5 MG tablet Take 5 mg by mouth daily.         benzonatate (TESSALON) 100 MG capsule Take 100 mg by mouth 3 (three) times daily as needed.         hydrochlorothiazide (MICROZIDE) 12.5 MG capsule Take 12.5 mg by mouth every morning.         lisinopril (PRINIVIL,ZESTRIL) 40 MG tablet Take 40 mg by mouth daily.         omeprazole (PRILOSEC) 40 MG capsule Take 40 mg by mouth daily.         XARELTO 20 MG Tab Take 20 mg by mouth daily.                 Review of Systems   Constitutional: Negative for fever.   HENT: Negative for congestion, ear pain, rhinorrhea and sore throat.    Eyes: Negative  for discharge and redness.   Respiratory: Negative for cough, chest tightness and shortness of breath.    Cardiovascular: Negative for chest pain and leg swelling.   Gastrointestinal: Positive for abdominal pain. Negative for nausea, vomiting, diarrhea, constipation, blood in stool, melena, hematochezia, anorexia and hematemesis.   Genitourinary: Negative for dysuria, urgency, frequency, hematuria, flank pain, decreased urine volume, penile swelling, scrotal swelling and testicular pain.   Musculoskeletal: Negative for myalgias, joint swelling and neck stiffness.   Skin: Negative for rash.   Neurological: Negative for seizures and headaches.   Hematological: Negative for adenopathy. Does not bruise/bleed easily.   Psychiatric/Behavioral: Negative for suicidal ideas.       Physical Exam    BP: 144/90 mmHg, Heart Rate: (!) 103, Temp: 97.7 F (36.5 C), Resp Rate: 14, SpO2: 100 %, Weight: 100.5 kg    Physical Exam   Constitutional: He is oriented to person, place, and time. Vital signs are normal. He appears well-developed and well-nourished. No distress.   HENT:   Head: Normocephalic and atraumatic.    Right Ear: External ear normal.   Left Ear: External ear normal.   Nose: Nose normal.   Mouth/Throat: Oropharynx is clear and moist. No oropharyngeal exudate.   Eyes: Conjunctivae and EOM are normal. Pupils are equal, round, and reactive to light. Right eye exhibits no discharge. Left eye exhibits no discharge. No scleral icterus.   Neck: Normal range of motion. Neck supple. No JVD present. No tracheal deviation present. No thyroid mass and no thyromegaly present.   Cardiovascular: Normal rate, regular rhythm, normal heart sounds and intact distal pulses.  Exam reveals no gallop and no friction rub.    No murmur heard.  Pulmonary/Chest: Effort normal and breath sounds normal. No accessory muscle usage or stridor. No tachypnea. No respiratory distress. He has no wheezes. He has no rales. He exhibits no tenderness.   Abdominal: Soft. Bowel sounds are normal. He exhibits no distension and no mass. There is generalized tenderness. There is no rigidity, no rebound and no guarding.   Musculoskeletal: Normal range of motion. He exhibits no edema or tenderness.   Lymphadenopathy:     He has no cervical adenopathy.   Neurological: He is alert and oriented to person, place, and time. He has normal strength. No cranial nerve deficit. He exhibits normal muscle tone. Coordination normal.   Skin: Skin is warm and dry. No ecchymosis, no lesion, no petechiae and no rash noted. He is not diaphoretic. No cyanosis or erythema. No pallor. Nails show no clubbing.   Psychiatric: He has a normal mood and affect. His behavior is normal.   Nursing note and vitals reviewed.        MDM and ED Course     ED Medication Orders     Start     Status Ordering Provider    04/29/14 1517  HYDROmorphone (DILAUDID) injection 1 mg   Once in ED     Route: Intravenous  Ordered Dose: 1 mg     Last MAR action:  Given Sonia Baller R    04/29/14 1517  ondansetron (ZOFRAN) injection 4 mg   Once in ED     Route: Intravenous  Ordered Dose: 4 mg     Last  MAR action:  Given Fabian Sharp    04/29/14 1332  sodium chloride 0.9 % bolus 1,000 mL   Once in ED     Route: Intravenous  Ordered Dose: 1,000 mL     Last [facility]  action:  Stopped Fabian Sharp    04/29/14 1332  ondansetron (ZOFRAN) injection 4 mg   Once in ED     Route: Intravenous  Ordered Dose: 4 mg     Last MAR action:  Given Sonia Baller R              MDM  Number of Diagnoses or Management Options  Acute abdominal pain: new and requires workup  Chronic anticoagulation: established and improving  Chronic atrial fibrillation: established and improving  Dry heaves: new and requires workup  Essential hypertension: established and improving  Diagnosis management comments: The patient presents to the Emergency Department with abdominal pain. Treatment has been initiated in the ER, but the patient has not had significant improvement in symptoms, appears ill enough and/or has illness/findings/co-morbidities that make admission for IV medications, possible surgical consult, and further management the most appropriate disposition. Differential diagnosis has included but is not limited to appendicitis, gall bladder disease, bowel obstruction, colitis, gastroenteritis, urinary tract obstruction, AAA, pancreatitic and hepatitis.  Diagnostic impression and plan were discussed and agreed upon with the patient and/or family.  Results of lab/radiology tests were reviewed and discussed with the patient and/or family. All questions were answered and concerns addressed.  Appropriate consultation was made for admission and further treatment of this patient.               Amount and/or Complexity of Data Reviewed  Clinical lab tests: ordered and reviewed  Tests in the radiology section of CPT: ordered and reviewed  Tests in the medicine section of CPT: ordered and reviewed  Decide to obtain previous medical records or to obtain history from someone other than the patient: yes  Discuss the patient with other  providers: yes  Independent visualization of images, tracings, or specimens: yes    Risk of Complications, Morbidity, and/or Mortality  Presenting problems: moderate  Diagnostic procedures: low  Management options: moderate    Patient Progress  Patient progress: improved         Procedures    Clinical Impression & Disposition     Clinical Impression  Final diagnoses:   Acute abdominal pain   Chronic atrial fibrillation   Chronic anticoagulation   Dry heaves   Essential hypertension        ED Disposition     Observation Admitting Physician: Myna Hidalgo [[id number]]  Diagnosis: Abdominal pain [[id number]]  Estimated Length of Stay: < 2 midnights  Tentative Discharge Plan?: Home or Self Care [1]  Patient Class: Observation [104]             Current Discharge Medication List                      Fabian Sharp, MD  04/29/14 1831

## 2014-04-29 NOTE — Plan of Care (Signed)
Problem: Pain  Goal: Patient's pain/discomfort is manageable  Outcome: Progressing  Assess and monitor patient's pain using appropriate pain scale.  Collaborate with interdisciplinarily team and initiate pain interventions as ordered. Reassess patient's level 30-60 minutes after pain management intervention. Pain goal 6 or less.Assess for pain and medicate when needed

## 2014-04-30 LAB — BASIC METABOLIC PANEL
Anion Gap: 11.2 mMol/L (ref 7.0–18.0)
BUN / Creatinine Ratio: 13.3 Ratio (ref 10.0–30.0)
BUN: 14 mg/dL (ref 7–22)
CO2: 23 mMol/L (ref 20.0–30.0)
Calcium: 8.2 mg/dL — ABNORMAL LOW (ref 8.5–10.5)
Chloride: 107 mMol/L (ref 98–110)
Creatinine: 1.05 mg/dL (ref 0.80–1.30)
EGFR: >60 mL/min/{1.73_m2}
Glucose: 110 mg/dL — ABNORMAL HIGH (ref 70–99)
Osmolality Calc: 275 mOsm/kg (ref 275–300)
Potassium: 4.2 mMol/L (ref 3.5–5.3)
Sodium: 137 mMol/L (ref 136–147)

## 2014-04-30 LAB — CBC
Hematocrit: 45.5 % (ref 39.0–52.5)
Hemoglobin: 14.9 gm/dL (ref 13.0–17.5)
MCH: 30 pg (ref 28–35)
MCHC: 33 gm/dL (ref 32–36)
MCV: 92 fL (ref 80–100)
MPV: 7.7 fL (ref 6.0–10.0)
PLT CT: 217 10*3/uL (ref 130–440)
RBC: 4.94 10*6/uL (ref 4.00–5.70)
RDW: 12.8 % (ref 11.0–14.0)
WBC: 10 10*3/uL (ref 4.0–11.0)

## 2014-04-30 MED ORDER — FAMOTIDINE 20 MG PO TABS
20.0000 mg | ORAL_TABLET | Freq: Two times a day (BID) | ORAL | Status: DC
Start: 2014-04-30 — End: 2015-03-23

## 2014-04-30 MED ORDER — ONDANSETRON 4 MG PO TBDP
4.0000 mg | ORAL_TABLET | Freq: Three times a day (TID) | ORAL | Status: DC | PRN
Start: 2014-04-30 — End: 2015-03-23

## 2014-04-30 NOTE — Discharge Instructions (Signed)
Abdominal Pain  Abdominal pain is pain in the stomach or intestinal area. Everyone has this pain from time to time. In many cases it goes away on its own. But abdominal pain can sometimes be due to a serious problem, such as appendicitis. So it's important to know when to seek help.  Causes of abdominal pain  There are many possible causes of abdominal pain. Common causes in adults include:   Constipation, diarrhea, or gas   GERD (gastroesophageal reflux disease) movement of stomach acid into the esophagus, also known as acid reflux or heartburn   Peptic ulcer (a sore in the lining of the stomach or small intestine)   Inflammation of the gallbladder, liver,or pancreas   Gallstones or kidney stones   Appendicitis   Obstruction of the intestines   Hernia (bulging of an internal organ through a muscle or other tissue)   Urinary tract infections   In women, menstrual cramps, fibroids, or endometriosis of the uterus   Inflammation or infection of the intestines  Diagnosing the cause of abdominal pain  Your health care provider will examine you to help find the cause of your pain. If needed, tests will be ordered. Because abdominal pain has so many possible causes, it can be hard to discover the reason for the pain. Giving details about your pain can help. Be ready to tell your health care provider where and when you feel the pain and what makes it better or worse. Also mention whether you have other symptoms such as fever, tiredness, nausea, vomiting, or changes in bathroom habits.  Treating abdominal pain  Certain causes of pain, such as appendicitis or a bowel obstruction, need emergency treatment. Other problems can be treated with rest, fluids, or medications. Your health care provider can give you specific instructions for treatment or self-care based on the cause of your pain.     When to call the doctor  Call 911or go to the hospital right away if you:   Can't pass stool and are vomiting   Are  vomiting blood or have black, tarry diarrhea   Also have chest, neck, or shoulder pain   Feel like you are about to pass out   Have pain in your shoulder blades with nausea   Have sudden, excruciating abdominal pain   Have new, severepain unlike any you have felt before   Have a belly that is rigid, hard, and tender to touch  Call your doctor if you have:   Pain for more than5days   Bloating for more than 2days   Diarrhea for more than5days   Feverof101F(38.3C) or higher   Pain that continues to worsen   Unexplained weight loss   Continued lack of appetite   Blood in the stool  How to prevent abdominal pain  Here are some tips to help prevent abdominal pain:   Eat smaller amounts of food at one time.   Avoid greasy, fried, or other high-fat foods.   Avoid foods that give you gas.   Exercise regularly.   Drink plenty of fluids.  To help prevent symptoms of gastroesophageal reflux disease (GERD):   Quit smoking.   Reduce alcohol and certain foods that increase stomach acid.   Lose excess weight.   Finish eating at least 2 hours before you go to bed or lie down.   Elevate the head of your bed.   2000-2015 [employer] [address], [place] [address]. All rights reserved. This information is   not intended as a substitute for professional medical care. Always follow your healthcare professional's instructions.

## 2014-04-30 NOTE — Plan of Care (Signed)
Problem: Pain  Goal: Patient's pain/discomfort is manageable  Outcome: Progressing

## 2014-04-30 NOTE — Progress Notes (Signed)
[place] instructions reviewed with pt, no further questions offered.

## 2014-04-30 NOTE — Plan of Care (Signed)
Problem: Safety  Goal: Patient will be free from injury during hospitalization  Outcome: Progressing

## 2014-04-30 NOTE — Discharge Summary (Signed)
[facility]      Patient: Christopher Advani Sr.  Admission Date: 04/29/2014   DOB: Mar 29, 1946  Discharge Date: 04/30/2014    MRN: [id number]  Discharge Attending: Myna Hidalgo, MD   Referring Physician: Tanna Savoy, MD  PCP: Tanna Savoy, MD       DISCHARGE SUMMARY     Discharge Information   Admission Diagnosis:   1. Abdominal pain  2. History hypertension    Discharge Diagnosis:   Patient Active Problem List    Diagnosis Date Noted   . Abdominal pain 04/29/2014        Discharge Medications:     Medication List      START taking these medications          famotidine 20 MG tablet   Commonly known as:  PEPCID   Take 1 tablet (20 mg total) by mouth 2 (two) times daily.       ondansetron 4 MG disintegrating tablet   Commonly known as:  ZOFRAN-ODT   Take 1 tablet (4 mg total) by mouth every 8 (eight) hours as needed.         CONTINUE taking these medications          amLODIPine 5 MG tablet   Commonly known as:  NORVASC       benzonatate 100 MG capsule   Commonly known as:  TESSALON       hydrochlorothiazide 12.5 MG capsule   Commonly known as:  MICROZIDE       lisinopril 40 MG tablet   Commonly known as:  PRINIVIL,ZESTRIL       omeprazole 40 MG capsule   Commonly known as:  PriLOSEC       XARELTO 20 MG Tabs   Generic drug:  rivaroxaban         Where to Get Your Medications     These are the prescriptions that you need to pick up.         You may get the following medications from any pharmacy   -  famotidine 20 MG tablet   -  ondansetron 4 MG disintegrating tablet                          Hospital Course   Presentation History   Christopher Meddaugh Sr. is a 69 y.o. male who presents with abdominal pain. Patient said that it woke him up at 3 this morning. He said he went to bed late last night watching the ball drop for New Year's eve. He then watched some old Western's. The patient said that he had dry heaves. No diarrhea. No fever. No blood in the stool. He's  had a previous appendectomy. He had a stool this morning. The patient was seen in the emergency room for his CT that was unremarkable course Dr. Edson Snowball blood. His white count liver tests are normal. The patient denies a heartburn after eating. He has some dizziness. Patient's said that he was having similar symptoms that lasted about a day before his colonoscopy 2 years ago. He said afterwards his belly pain got better.    . The last several months however the the discomfort has been recurring. It measured gradually comes on. He was concerned about his gallbladder because his sister had similar belly problems and they took out her gallbladder and it relieved her symptoms. The patient gets early satiety. He denies any dysphasia. He  has a history of hypertension for which she sees Dr. Leonette Monarch. The patient denies a weight change. No melena. The patient was given pain medicine in the emergency room and he said it made him dizzy.      Hospital Course (1 Days)   Patient was transferred to the observation unit for IV fluid resuscitation and pain control. Patient did well overnight. This morning he is feeling much better, he is tolerating a regular diet and is requesting discharge home. He had a CT scan the abdomen which was negative. He did receive Pepcid and is requesting to be discharged home with this because he states this helped him. I have given him for Pepcid as well as Zofran. Patient states that he has a follow-up appointment with his primary care physician on Monday. I advised him to keep his appointment and let him know that he wasn't back to the hospital for abdominal pain. Patient may need a HIDA scan for further evaluation of the gallbladder. Again patient is requesting discharge home. Patient was advised to return to the emergency department with any new or worsening symptoms.    Procedures/Imaging:   CT Abdomen Pelvis with IV Cont   Final Result   No evidence of acute inflammatory process in the abdomen or  pelvis.            ReadingStation:[id number]          Treatment Team:   Attending Provider: Myna Hidalgo, MD       Progress Note/Physical Exam at Discharge   Filed Vitals:    04/29/14 1946 04/29/14 2301 04/30/14 0411 04/30/14 0806   BP: 139/82 135/95 135/93 146/97   Pulse: 88      Temp: 97.5 F (36.4 C) 97.6 F (36.4 C) 97.7 F (36.5 C) 97.7 F (36.5 C)   TempSrc: Oral Oral Oral Oral   Resp: 17 14 15 16    Height:       Weight:       SpO2: 98% 98% 99% 99%        Diagnostics     Labs/Studies Pending at Discharge: No    Last Labs     Recent Labs  Lab 04/30/14  0501 04/29/14  1211   WBC 10.0 11.4*   RBC 4.94 5.38   HEMOGLOBIN 14.9 16.4   HEMATOCRIT 45.5 48.7   MCV 92 90   PLATELETS 217 250         Recent Labs  Lab 04/30/14  0501 04/29/14  1211   SODIUM 137 140   POTASSIUM 4.2 3.9   CHLORIDE 107 104   CO2 23.0 23.4   BUN 14 19   CREATININE 1.05 1.09   GLUCOSE 110* 121*   CALCIUM 8.2* 9.0        Patient Instructions   Discharge Diet: regular diet  Discharge Activity:  activity as tolerated    Follow Up Appointment:        Follow-up Information     Follow up with Gustin, Alejandro Mulling, MD.    Specialty:  Family Medicine    Why:  Follow up on Monday as scheduled    Contact information:    [address]  [place] [address]  [phone]             Time spent examining patient, discussing with patient/family regarding hospital course, chart review, reconciling medications and discharge planning: 30 minutes.      Rolin Barry, NP    9:41 AM 04/30/2014

## 2014-05-02 NOTE — UM Notes (Signed)
[facility] Utilization Management Review Sheet    NAME: Christopher Kaminsky Sr.  MR#: [id number]    CSN#: [id number]    ROOM: 2505/2505-A AGE: 69 y.o.    ADMIT DATE AND TIME: 04/29/2014 11:50 AM      PATIENT CLASS: OBSERVATION  04/29/14 @ 1609  ADMIT OBSERVATION- OBS UNIT    ATTENDING PHYSICIAN: No att. providers found  PAYOR:Payor: MEDICARE HMO / Plan: [employer] MEDICARE PPO PFFS / Product Type: *No Product type* /       AUTH #:     DIAGNOSIS:     ICD-10-CM    1. Acute abdominal pain R10.0    2. Chronic atrial fibrillation I48.2    3. Chronic anticoagulation Z79.01    4. Dry heaves R11.10    5. Essential hypertension I10        HISTORY:   Past Medical History   Diagnosis Date   . Hypertension    . Atrial fibrillation      DATE OF ED TREATMENT: 04/29/14  TREATMENT IN ED:  DILAUDID IV,  ZOFRAN IV X 2, NS IV    OBSERVATION REVIEW    HPI: Presents with abdominal pain associated with nausea, and dry heaves    ABNORMAL LABS:  GLUC 121  WBC 11.4      DIAGNOSTIC TESTING:  CT ABDOMEN PELVIS WITH IV CONT:  No evidence of acute inflammatory process in the abdomen or pelvis.      VS:  BP: 144/90 mmHg, Heart Rate: (!) 103, Temp: 97.7 F (36.5 C), Resp Rate: 14, SpO2: 100 %, Weight: 100.5 kg    ABNORMAL FINDINGS: Abdomen is soft, slight distention. There is no tenderness on my examination.    ASSESSMENT AND PLAN:  ABDOMINAL PAIN    OBSERVATION ORDERS   VS ROUTINE,  PEPCID IV Q 12 HRS,   IVF@ 100 ML,  DILAUDID IV PRN     DAY 2 OBSERVATION FOLLOW-UP  04/30/14  VS: BP 153/83 mmHg  Pulse 88  Temp(Src) 98.3 F (36.8 C) (Oral)  Resp 16  Ht 1.829 m (6')  Wt 100.5 kg (221 lb 9 oz)  BMI 30.04 kg/m2  SpO2 100%  [facility]  DATE OF REVIEW: 05/02/2014    Wende Mott, RN  Utilization Management  Case Management Department    [facility]  [address]  [place], [place] [address]  T [phone]    F [phone]   [email or web address]

## 2014-05-31 ENCOUNTER — Emergency Department: Payer: Medicare PPO

## 2014-05-31 ENCOUNTER — Observation Stay: Payer: Medicare PPO | Admitting: Internal Medicine

## 2014-05-31 ENCOUNTER — Observation Stay
Admission: EM | Admit: 2014-05-31 | Discharge: 2014-06-01 | Disposition: A | Discharge status: Home / Self Care | Payer: Medicare PPO | Attending: Internal Medicine | Admitting: Internal Medicine

## 2014-05-31 DIAGNOSIS — Z9089 Acquired absence of other organs: Secondary | ICD-10-CM | POA: Insufficient documentation

## 2014-05-31 DIAGNOSIS — R110 Nausea: Secondary | ICD-10-CM | POA: Diagnosis present

## 2014-05-31 DIAGNOSIS — R109 Unspecified abdominal pain: Principal | ICD-10-CM | POA: Insufficient documentation

## 2014-05-31 DIAGNOSIS — R100 Acute abdomen: Secondary | ICD-10-CM

## 2014-05-31 DIAGNOSIS — Z7901 Long term (current) use of anticoagulants: Secondary | ICD-10-CM | POA: Insufficient documentation

## 2014-05-31 DIAGNOSIS — Z86010 Personal history of colonic polyps: Secondary | ICD-10-CM | POA: Insufficient documentation

## 2014-05-31 DIAGNOSIS — F17210 Nicotine dependence, cigarettes, uncomplicated: Secondary | ICD-10-CM | POA: Insufficient documentation

## 2014-05-31 DIAGNOSIS — I10 Essential (primary) hypertension: Secondary | ICD-10-CM | POA: Insufficient documentation

## 2014-05-31 DIAGNOSIS — Z2821 Immunization not carried out because of patient refusal: Secondary | ICD-10-CM | POA: Insufficient documentation

## 2014-05-31 DIAGNOSIS — I4891 Unspecified atrial fibrillation: Secondary | ICD-10-CM | POA: Insufficient documentation

## 2014-05-31 LAB — CBC AND DIFFERENTIAL
Basophils %: 0.5 % (ref 0.0–3.0)
Basophils Absolute: 0.1 10*3/uL (ref 0.0–0.3)
Eosinophils %: 1.9 % (ref 0.0–7.0)
Eosinophils Absolute: 0.2 10*3/uL (ref 0.0–0.8)
Hematocrit: 48.7 % (ref 39.0–52.5)
Hemoglobin: 16 gm/dL (ref 13.0–17.5)
Lymphocytes Absolute: 1.7 10*3/uL (ref 0.6–5.1)
Lymphocytes: 15.1 % (ref 15.0–46.0)
MCH: 30 pg (ref 28–35)
MCHC: 33 gm/dL (ref 32–36)
MCV: 91 fL (ref 80–100)
MPV: 7.5 fL (ref 6.0–10.0)
Monocytes Absolute: 0.7 10*3/uL (ref 0.1–1.7)
Monocytes: 6.3 % (ref 3.0–15.0)
Neutrophils %: 76.2 % (ref 42.0–78.0)
Neutrophils Absolute: 8.3 10*3/uL (ref 1.7–8.6)
PLT CT: 245 10*3/uL (ref 130–440)
RBC: 5.38 10*6/uL (ref 4.00–5.70)
RDW: 12.9 % (ref 11.0–14.0)
WBC: 10.9 10*3/uL (ref 4.0–11.0)

## 2014-05-31 LAB — ECG 12-LEAD
Patient Age: 68 years
Q-T Dispersion: 28 ms
Q-T Interval(Corrected): 443 ms
Q-T Interval: 332 ms
QRS Axis: 63 deg
QRS Duration: 106 ms
T Axis: -1 deg
Ventricular Rate: 107 /min

## 2014-05-31 LAB — COMPREHENSIVE METABOLIC PANEL
ALT: 13 U/L (ref 0–55)
AST (SGOT): 16 U/L (ref 10–42)
Albumin/Globulin Ratio: 1.22 Ratio (ref 0.70–1.50)
Albumin: 3.9 gm/dL (ref 3.5–5.0)
Alkaline Phosphatase: 90 U/L (ref 40–145)
Anion Gap: 13.2 mMol/L (ref 7.0–18.0)
BUN / Creatinine Ratio: 14.3 Ratio (ref 10.0–30.0)
BUN: 17 mg/dL (ref 7–22)
Bilirubin, Total: 0.6 mg/dL (ref 0.1–1.2)
CO2: 23.6 mMol/L (ref 20.0–30.0)
Calcium: 9.2 mg/dL (ref 8.5–10.5)
Chloride: 104 mMol/L (ref 98–110)
Creatinine: 1.19 mg/dL (ref 0.80–1.30)
EGFR: >60 mL/min/{1.73_m2}
Globulin: 3.2 gm/dL (ref 2.0–4.0)
Glucose: 117 mg/dL — ABNORMAL HIGH (ref 70–99)
Osmolality Calc: 276 mOsm/kg (ref 275–300)
Potassium: 3.8 mMol/L (ref 3.5–5.3)
Protein, Total: 7.1 gm/dL (ref 6.0–8.3)
Sodium: 137 mMol/L (ref 136–147)

## 2014-05-31 LAB — LIPASE: Lipase: 13 U/L (ref 8–78)

## 2014-05-31 LAB — TROPONIN I: Troponin I: 0.01 ng/mL (ref 0.00–0.02)

## 2014-05-31 LAB — PT AND APTT
PT INR: 1.4 — ABNORMAL HIGH (ref 0.5–1.3)
PT: 14.6 s — ABNORMAL HIGH (ref 9.5–11.5)
aPTT: 39.3 s — ABNORMAL HIGH (ref 24.0–34.0)

## 2014-05-31 MED ORDER — VH HYDROMORPHONE HCL 1 MG/ML (NARRATOR)
INTRAMUSCULAR | Status: AC
Start: 2014-05-31 — End: ?
  Filled 2014-05-31: qty 1

## 2014-05-31 MED ORDER — ONDANSETRON HCL 4 MG/2ML IJ SOLN
4.0000 mg | Freq: Once | INTRAMUSCULAR | Status: AC
Start: 2014-05-31 — End: 2014-05-31
  Administered 2014-05-31: 4 mg via INTRAVENOUS

## 2014-05-31 MED ORDER — ONDANSETRON HCL 4 MG/2ML IJ SOLN
INTRAMUSCULAR | Status: AC
Start: 2014-05-31 — End: ?
  Filled 2014-05-31: qty 2

## 2014-05-31 MED ORDER — PROMETHAZINE HCL 25 MG/ML IJ SOLN
12.5000 mg | Freq: Once | INTRAMUSCULAR | Status: AC
Start: 2014-05-31 — End: 2014-05-31
  Administered 2014-05-31: 12.5 mg via INTRAVENOUS

## 2014-05-31 MED ORDER — PROMETHAZINE HCL 25 MG/ML IJ SOLN
INTRAMUSCULAR | Status: AC
Start: 2014-05-31 — End: ?
  Filled 2014-05-31: qty 1

## 2014-05-31 MED ORDER — VH HYDROMORPHONE HCL PF 1 MG/ML CARPUJECT
1.0000 mg | Freq: Once | INTRAMUSCULAR | Status: AC
Start: 2014-05-31 — End: 2014-05-31
  Administered 2014-05-31: 1 mg via INTRAVENOUS

## 2014-05-31 MED ORDER — IOHEXOL 240 MG/ML IJ SOLN
50.0000 mL | Freq: Once | INTRAMUSCULAR | Status: AC
Start: 2014-05-31 — End: 2014-05-31
  Administered 2014-05-31: 50 mL via ORAL

## 2014-05-31 MED ORDER — IOHEXOL 350 MG/ML IV SOLN
100.0000 mL | Freq: Once | INTRAVENOUS | Status: AC | PRN
Start: 2014-05-31 — End: 2014-05-31
  Administered 2014-05-31: 100 mL via INTRAVENOUS

## 2014-05-31 NOTE — ED Notes (Signed)
Admitting MD at bedside.

## 2014-05-31 NOTE — ED Notes (Signed)
Bed: C15-A  Expected date:   Expected time:   Means of arrival:   Comments:  EMS

## 2014-05-31 NOTE — H&P (Signed)
ADMISSION HISTORY AND PHYSICAL EXAM    Date Time: 05/31/2014 11:28 PM  Patient Name: Christopher Dickerson, Christopher SR.  Attending Physician: Tessie Fass, MD  Primary Care Physician: Tanna Savoy, MD    Past Medical History:     Past Medical History   Diagnosis Date   . Hypertension    . Atrial fibrillation      CC: Abdominal pain  History of Presenting Illness:   Christopher Krieger Sr. is a 69 y.o. male who presents to the hospital with abdominal pain. The patient is struggling with abdominal pain, for nearly a month. The patient has episodes of pain that occur once a week. It is a generalized periumbilical pain, that the patient says is not simply on the surface. He does not think it's a muscle strain. The patient has been having nausea, but reports that he has only been having dry heaves and not actually vomiting. He states he has not been having any diarrhea, and reports that he is not constipated. The patient does not report any subjective fever or chills. He does not report any chest pain or shortness of breath.  Past Surgical History:     Past Surgical History   Procedure Laterality Date   . Lift, arm (medical)     . Knee arthroscopy w/ acl reconstruction     . Appendectomy     . Colonoscopy, polypectomy  12/16/2012     Procedure: COLONOSCOPY, POLYPECTOMY;  Surgeon: Gwenith Spitz, MD;  Location: [facility];  Service: Gastroenterology;  Laterality: N/A;   . Egd, biopsy  12/16/2012     Procedure: EGD, BIOPSY;  Surgeon: Gwenith Spitz, MD;  Location: [facility];  Service: Gastroenterology;  Laterality: N/A;     Allergies:   No Known Allergies    Medications:     Prior to Admission medications    Medication Sig Start Date End Date Taking? Authorizing Provider   amLODIPine (NORVASC) 5 MG tablet Take 5 mg by mouth daily.    10/07/12  Yes [provider]   benzonatate (TESSALON) 100 MG capsule Take 100 mg by mouth 3 (three) times daily as needed.    03/22/14  Yes [provider]   famotidine (PEPCID) 20 MG tablet Take 1 tablet (20 mg total) by mouth 2 (two) times daily. 04/30/14  Yes Rolin Barry, NP   hydrochlorothiazide (MICROZIDE) 12.5 MG capsule Take 12.5 mg by mouth every morning.    10/07/12  Yes [provider]   lisinopril (PRINIVIL,ZESTRIL) 40 MG tablet Take 40 mg by mouth daily.    10/07/12  Yes [provider]   omeprazole (PRILOSEC) 40 MG capsule Take 40 mg by mouth daily.    02/02/14  Yes [provider]   ondansetron (ZOFRAN-ODT) 4 MG disintegrating tablet Take 1 tablet (4 mg total) by mouth every 8 (eight) hours as needed. 04/30/14  Yes Rolin Barry, NP   XARELTO 20 MG Tab Take 20 mg by mouth daily.    03/22/14  Yes [provider]     Social History:     History     Social History   . Marital Status: Married     Spouse Name: N/A     Number of Children: N/A   . Years of Education: N/A     Occupational History   . Not on file.     Social History Main Topics   . Smoking status: Current Every Day Smoker -- 0.50  packs/day     Types: Cigarettes   . Smokeless tobacco: Never Used   . Alcohol Use: Yes      Comment: occasional   . Drug Use: No   . Sexual Activity: Not on file     Other Topics Concern   . Not on file     Social History Narrative     Family History:   History reviewed. No pertinent family history.    Review of Systems:   Review of Systems   Constitutional: Negative for fever, chills and malaise/fatigue.   Respiratory: Negative for cough and shortness of breath.    Cardiovascular: Negative for chest pain, palpitations and leg swelling.   Gastrointestinal: Positive for nausea and abdominal pain. Negative for vomiting, diarrhea, constipation and blood in stool.   Genitourinary: Negative for dysuria.   Skin: Negative for itching and rash.   Neurological: Negative for dizziness, focal weakness and headaches.     Physical Exam:   Patient Vitals for the past 24 hrs:   BP Temp Pulse Resp SpO2 Height Weight   05/31/14 2230 (!)  121/92 mmHg - 81 17 96 % - -   05/31/14 2130 (!) 129/91 mmHg 98 F (36.7 C) 80 15 95 % - -   05/31/14 2031 141/89 mmHg - 82 14 98 % - -   05/31/14 1931 112/84 mmHg - 91 16 95 % - -   05/31/14 1857 (!) 141/92 mmHg - 89 (!) 7 98 % - -   05/31/14 1632 (!) 155/92 mmHg 97.5 F (36.4 C) (!) 106 18 100 % 1.829 m (6') 97.523 kg (215 lb)     Body mass index is 29.15 kg/(m^2).  No intake or output data in the 24 hours ending 05/31/14 2328  Physical Exam   Constitutional: He is oriented to person, place, and time. He appears well-nourished. No distress.   HENT:   Head: Normocephalic and atraumatic.   Nose: Nose normal.   Mouth/Throat: Oropharynx is clear and moist.   Eyes: Conjunctivae and EOM are normal. No scleral icterus.   Neck: No JVD present. No tracheal deviation present.   Cardiovascular: Normal rate, regular rhythm and normal heart sounds.  Exam reveals no friction rub.    No murmur heard.  no palpable thrill   Pulmonary/Chest: Effort normal and breath sounds normal. No stridor. No respiratory distress. He has no decreased breath sounds. He has no wheezes. He has no rhonchi. He has no rales.   Abdominal: Soft. Bowel sounds are normal. He exhibits no distension and no mass. There is tenderness. There is no rebound and no guarding.   Musculoskeletal: Normal range of motion. He exhibits no edema or tenderness.   Neurological: He is alert and oriented to person, place, and time. He displays normal reflexes. No cranial nerve deficit or sensory deficit. He exhibits normal muscle tone. Coordination normal.   Skin: No rash noted. No erythema.   Psychiatric: He has a normal mood and affect. His behavior is normal.     Labs:     Results     Procedure Component Value Units Date/Time    Lipase [[id number]] Collected:  05/31/14 1722    Specimen Information:  Plasma Updated:  05/31/14 1812     Lipase 13 U/L     Troponin I (STAT) [[id number]] Collected:  05/31/14 1722    Specimen Information:  Plasma Updated:  05/31/14 1809      Troponin I 0.01 ng/mL     Comprehensive metabolic panel [[id number]]  (  Abnormal) Collected:  05/31/14 1722    Specimen Information:  Plasma Updated:  05/31/14 1807     Sodium 137 mMol/L      Potassium 3.8 mMol/L      Chloride 104 mMol/L      CO2 23.6 mMol/L      CALCIUM 9.2 mg/dL      Glucose 161 (H) mg/dL      Creatinine 0.96 mg/dL      BUN 17 mg/dL      Protein, Total 7.1 gm/dL      Albumin 3.9 gm/dL      Alkaline Phosphatase 90 U/L      ALT 13 U/L      AST (SGOT) 16 U/L      Bilirubin, Total 0.6 mg/dL      Albumin/Globulin Ratio 1.22 Ratio      Anion Gap 13.2 mMol/L      BUN/Creatinine Ratio 14.3 Ratio      EGFR >60 mL/min/1.58m2      Osmolality Calc 276 mOsm/kg      Globulin 3.2 gm/dL     PT/APTT [[id number]]  (Abnormal) Collected:  05/31/14 1722    Specimen Information:  Blood Updated:  05/31/14 1759     PT 14.6 (H) sec      PT INR 1.4 (H)      aPTT 39.3 (H) sec     CBC and differential [[id number]] Collected:  05/31/14 1722    Specimen Information:  Blood / Blood Updated:  05/31/14 1744     WBC 10.9 K/cmm      RBC 5.38 M/cmm      Hemoglobin 16.0 gm/dL      Hematocrit 21.3 %      MCV 91 fL      MCH 30 pg      MCHC 33 gm/dL      RDW 08.6 %      PLT CT 245 K/cmm      MPV 7.5 fL      NEUTROPHIL % 76.2 %      Lymphocytes 15.1 %      Monocytes 6.3 %      Eosinophils % 1.9 %      Basophils % 0.5 %      Neutrophils Absolute 8.3 K/cmm      Lymphocytes Absolute 1.7 K/cmm      Monocytes Absolute 0.7 K/cmm      Eosinophils Absolute 0.2 K/cmm      BASO Absolute 0.1 K/cmm         Estimated Creatinine Clearance: 71.9 mL/min (based on Cr of 1.19).    Radiology:     Radiology Results (24 Hour)     Procedure Component Value Units Date/Time    CT Abdomen Pelvis W IV And PO Cont [[id number]] Collected:  05/31/14 2105    Order Status:  Completed Updated:  05/31/14 2126    Addenda:        Delayed imaging suggests apparent bladder lesion is related to the   prostate gland. No discrete suspicious mass lesion  demonstrated.   ReadingStation:[email or web address]  Signed:  05/31/14 2125 by Lavell Luster II, MD    Narrative:      Clinical History:  Abdominal pain    Examination:  CT ABDOMEN PELVIS W IV AND PO CONT    Study Dose:  DLP 899.29 mGy.cm    Comparison:  April 29, 2014.    Findings:  Abdomen CT:    Atelectatic changes in the lung bases are present.  No pleural effusion or pneumothorax demonstrated. Heart is enlarged.    Mild hepatic steatosis is present. No suspicious hepatic lesions present. Gallbladder images normally. Spleen is upper  limits normal in size measuring 12.5 cm maximally.    Adrenal glands image normally. There is mild bilateral renal cortical thinning. Small bilateral renal cyst again  demonstrated.    Atherosclerosis of aorta is present without aneurysm. No adenopathy demonstrated.    No bowel wall thickening or dilatation demonstrated. No free air demonstrated.    Pelvis CT:    There appears to be a small enhancing lesion within the basal urinary bladder image 75 series 2. This measures  approximately 1 cm in size. Patient be asked return for delayed imaging to this region to assess for filling defect.    Atherosclerosis of the iliac arteries is present. No aneurysm demonstrated. No adenopathy demonstrated.    Prostatic calcifications present. Prostate gland measures [place] 4.4 cm transversely. No free fluid demonstrated.  Sigmoid colon rectum unremarkable.    No acute fracture nor destructive osseous lesion demonstrated. Enthesopathic changes are present in the pelvis.  Degenerative facet arthropathy in the lumbar spine noted, predominantly at L4-L5 and L5-S1.          Impression:        1. Concern for enhancing lesion within the base of the urinary bladder. Patient will be asked to return for additional  imaging through the pelvis and an addendum will be placed on this report that time.    2. No acute abdominal findings.    ReadingStation:[email or web address]    XR ABDOMEN 2 VIEW WITH CHEST 1 VIEW [[id number]] Collected:   05/31/14 1802    Order Status:  Completed Updated:  05/31/14 1807    Narrative:      Clinical History:  chest and abdominal pain.    Examination:  Frontal view of the chest with supine and erect views of the abdomen.    Comparison:  04/29/2014    Findings:  Heart size is upper normal with no focal infiltrate, pleural effusion, or pneumothorax. Degenerative changes of thoracic  spine.    Supine and erect views of abdomen demonstrate air-filled small bowel loops with some gas seen into the colon which may  represent ileus or partial obstruction. No renal or ureteral calculi are seen degenerative change lumbar spine.      Impression:      1. Air filled small bowel loops with gas seen into the colon findings which may represent ileus or partial obstruction.  No free air is identified.  2. No focal parenchymal lung infiltrate pleural effusion or pneumothorax. Heart size upper normal.  ReadingStation:[email or web address]        Imaging personally reviewed.    Assessment:   Principal Problem:    Abdominal pain  Active Problems:    Hypertension    Atrial fibrillation    Nausea    Plan:   1. Abdominal pain - etiology is unclear. I have ordered a mesenteric arterial duplex study for the morning.  2. Atrial fibrillation - rate appears to be controlled. The patient will be continued on Xarelto.  3. Hypertension - the patient's blood pressure is elevated. I will add prn hydralazine.  4. Nausea - we'll try to provide supportive care. I have ordered a urinalysis as well.  5. Bladder lesion - the CT report states that the patient will be brought back for further imaging. We'll follow-up on the addendum.    Signed by: Kerin Salen, MD  [address] [place]  [address]  [place]    ZO:[id number], Alejandro Mulling, MD

## 2014-05-31 NOTE — ED Notes (Signed)
Patient resting comfortably, A&O x3. No needs at this time.

## 2014-05-31 NOTE — ED Notes (Signed)
Patient dry heaving in bathroom, verbal order from MD for 12.5 phenergan.

## 2014-06-01 ENCOUNTER — Inpatient Hospital Stay: Payer: Medicare PPO

## 2014-06-01 LAB — URINALYSIS WITH CULTURE REFLEX
Bilirubin, UA: NEGATIVE
Blood, UA: NEGATIVE
Glucose, UA: NEGATIVE mg/dL
Ketones UA: NEGATIVE mg/dL
Leukocyte Esterase, UA: NEGATIVE Leu/uL
Nitrite, UA: NEGATIVE
Protein, UR: NEGATIVE mg/dL
RBC, UA: 4 /hpf (ref 0–4)
Urine Specific Gravity: 1.051 — ABNORMAL HIGH (ref 1.001–1.040)
Urobilinogen, UA: NORMAL mg/dL
WBC, UA: 1 /hpf (ref 0–4)
pH, Urine: 5 pH (ref 5.0–8.0)

## 2014-06-01 LAB — CBC AND DIFFERENTIAL
Basophils %: 0.3 % (ref 0.0–3.0)
Basophils Absolute: 0 10*3/uL (ref 0.0–0.3)
Eosinophils %: 2.8 % (ref 0.0–7.0)
Eosinophils Absolute: 0.3 10*3/uL (ref 0.0–0.8)
Hematocrit: 46.8 % (ref 39.0–52.5)
Hemoglobin: 15.4 gm/dL (ref 13.0–17.5)
Lymphocytes Absolute: 2.3 10*3/uL (ref 0.6–5.1)
Lymphocytes: 24.5 % (ref 15.0–46.0)
MCH: 30 pg (ref 28–35)
MCHC: 33 gm/dL (ref 32–36)
MCV: 92 fL (ref 80–100)
MPV: 7.6 fL (ref 6.0–10.0)
Monocytes Absolute: 0.7 10*3/uL (ref 0.1–1.7)
Monocytes: 7.5 % (ref 3.0–15.0)
Neutrophils %: 64.9 % (ref 42.0–78.0)
Neutrophils Absolute: 6.1 10*3/uL (ref 1.7–8.6)
PLT CT: 225 10*3/uL (ref 130–440)
RBC: 5.09 10*6/uL (ref 4.00–5.70)
RDW: 13.2 % (ref 11.0–14.0)
WBC: 9.4 10*3/uL (ref 4.0–11.0)

## 2014-06-01 LAB — COMPREHENSIVE METABOLIC PANEL
ALT: 12 U/L (ref 0–55)
AST (SGOT): 14 U/L (ref 10–42)
Albumin/Globulin Ratio: 1.17 Ratio (ref 0.70–1.50)
Albumin: 3.5 gm/dL (ref 3.5–5.0)
Alkaline Phosphatase: 76 U/L (ref 40–145)
Anion Gap: 15.3 mMol/L (ref 7.0–18.0)
BUN / Creatinine Ratio: 13.8 Ratio (ref 10.0–30.0)
BUN: 16 mg/dL (ref 7–22)
Bilirubin, Total: 0.6 mg/dL (ref 0.1–1.2)
CO2: 23 mMol/L (ref 20.0–30.0)
Calcium: 8.7 mg/dL (ref 8.5–10.5)
Chloride: 105 mMol/L (ref 98–110)
Creatinine: 1.16 mg/dL (ref 0.80–1.30)
EGFR: >60 mL/min/{1.73_m2}
Globulin: 3 gm/dL (ref 2.0–4.0)
Glucose: 98 mg/dL (ref 70–99)
Osmolality Calc: 279 mOsm/kg (ref 275–300)
Potassium: 4.3 mMol/L (ref 3.5–5.3)
Protein, Total: 6.5 gm/dL (ref 6.0–8.3)
Sodium: 139 mMol/L (ref 136–147)

## 2014-06-01 LAB — PHOSPHORUS: Phosphorus: 4.5 mg/dL (ref 2.3–4.7)

## 2014-06-01 LAB — MAGNESIUM: Magnesium: 2.3 mg/dL (ref 1.6–2.6)

## 2014-06-01 MED ORDER — SODIUM CHLORIDE 0.9 % IV SOLN
INTRAVENOUS | Status: DC
Start: 2014-06-01 — End: 2014-06-01

## 2014-06-01 MED ORDER — ACETAMINOPHEN 325 MG PO TABS
650.0000 mg | ORAL_TABLET | ORAL | Status: DC | PRN
Start: 2014-06-01 — End: 2014-06-01

## 2014-06-01 MED ORDER — LISINOPRIL 40 MG PO TABS
40.0000 mg | ORAL_TABLET | Freq: Every day | ORAL | Status: DC
Start: 2014-06-01 — End: 2014-06-01
  Administered 2014-06-01: 40 mg via ORAL
  Filled 2014-06-01: qty 1

## 2014-06-01 MED ORDER — PANTOPRAZOLE SODIUM 40 MG PO TBEC
40.0000 mg | DELAYED_RELEASE_TABLET | Freq: Every morning | ORAL | Status: DC
Start: 2014-06-01 — End: 2014-06-01
  Filled 2014-06-01: qty 1

## 2014-06-01 MED ORDER — HYDROCHLOROTHIAZIDE 12.5 MG PO TABS
12.5000 mg | ORAL_TABLET | Freq: Every day | ORAL | Status: DC
Start: 2014-06-01 — End: 2014-06-01
  Administered 2014-06-01: 12.5 mg via ORAL
  Filled 2014-06-01: qty 1

## 2014-06-01 MED ORDER — ONDANSETRON HCL 4 MG/2ML IJ SOLN
4.0000 mg | INTRAMUSCULAR | Status: DC | PRN
Start: 2014-06-01 — End: 2014-06-01

## 2014-06-01 MED ORDER — RIVAROXABAN 20 MG PO TABS
20.0000 mg | ORAL_TABLET | Freq: Every day | ORAL | Status: DC
Start: 2014-06-01 — End: 2014-06-01
  Filled 2014-06-01: qty 1

## 2014-06-01 MED ORDER — SODIUM CHLORIDE 0.9 % IJ SOLN
0.4000 mg | INTRAMUSCULAR | Status: DC | PRN
Start: 2014-06-01 — End: 2014-06-01

## 2014-06-01 MED ORDER — AMLODIPINE BESYLATE 5 MG PO TABS
5.0000 mg | ORAL_TABLET | Freq: Every day | ORAL | Status: DC
Start: 2014-06-01 — End: 2014-06-01
  Administered 2014-06-01: 5 mg via ORAL
  Filled 2014-06-01: qty 1

## 2014-06-01 MED ORDER — RIVAROXABAN 20 MG PO TABS
20.0000 mg | ORAL_TABLET | Freq: Every day | ORAL | Status: DC
Start: 2014-06-01 — End: 2014-06-01

## 2014-06-01 MED ORDER — MORPHINE SULFATE 2 MG/ML IJ/IV SOLN (WRAP)
2.0000 mg | Status: DC | PRN
Start: 2014-06-01 — End: 2014-06-01

## 2014-06-01 MED ORDER — HYDROCODONE-ACETAMINOPHEN 5-325 MG PO TABS
1.0000 | ORAL_TABLET | ORAL | Status: DC | PRN
Start: 2014-06-01 — End: 2014-06-01

## 2014-06-01 MED ORDER — BENZONATATE 100 MG PO CAPS
100.0000 mg | ORAL_CAPSULE | Freq: Three times a day (TID) | ORAL | Status: DC | PRN
Start: 2014-06-01 — End: 2014-06-01

## 2014-06-01 NOTE — Progress Notes (Signed)
Assumed care of pt at 0700. Pt currently resting in bed. Pt denies any N/V, pain or discomfort. Pt was educated on NPO diet. Pt was given a small cup of ice chips. Pt denies any further needs or concerns. Will continue to monitor. Call bell and phone within reach.

## 2014-06-01 NOTE — UM Notes (Signed)
[facility] Utilization Management Review Sheet    NAME: Christopher Spahr Sr.  MR#: [id number]    CSN#: [id number]    ROOM: 523/523-A AGE: 69 y.o.    ADMIT DATE AND TIME: 05/31/2014  4:32 PM      PATIENT CLASS:    ATTENDING PHYSICIAN: Esmond Harps, DO  PAYOR:Payor: MEDICARE HMO / Plan: [employer] MEDICARE PPO PFFS / Product Type: *No Product type* /       AUTH #:     DIAGNOSIS:     ICD-10-CM    1. Acute abdominal pain R10.0        HISTORY:   Past Medical History   Diagnosis Date   . Hypertension    . Atrial fibrillation        DATE OF REVIEW: 06/01/2014    VITALS: BP 143/95 mmHg  Pulse 73  Temp(Src) 96.1 F (35.6 C) (Oral)  Resp 18  Ht 1.829 m (6')  Wt 98.9 kg (218 lb 0.6 oz)  BMI 29.56 kg/m2  SpO2 97%    ED Treatment   Dilaudid IV x3, zofran IV x2, phenergan IV x1  Admission Review  History   The patient is struggling with abdominal pain, for nearly a month. The patient has episodes of pain that occur once a week. It is a generalized periumbilical pain, that the patient says is not simply on the surface. He does not think it's a muscle strain. The patient has been having nausea, but reports that he has only been having dry heaves and not actually vomiting. He states he has not been having any diarrhea, and reports that he is not constipated   VS  155/92, 97.5, 106, 18, 100%  Labs    Imagining   abd xray  IMPRESSION:   1. Air filled small bowel loops with gas seen into the colon findings which may represent ileus or partial obstruction.  No free air is identified.  2. No focal parenchymal lung infiltrate pleural effusion or pneumothorax. Heart size upper normal.  Assessment/Plan  . Abdominal pain - etiology is unclear. I have ordered a mesenteric arterial duplex study for the morning.  2. Atrial fibrillation - rate appears to be controlled. The patient will be continued on Xarelto.  3. Hypertension - the patient's blood pressure is elevated. I will add prn hydralazine.  4. Nausea - we'll try to provide supportive  care. I have ordered a urinalysis as well.  5. Bladder lesion - the CT report states that the patient will be brought back for further imaging. We'll follow-up on the addendum.    Admission Orders  IVF 100/hr, npo , CT abd, [place] of abd/pelvis    BS noted in all 4 quadrants, LBM 2/2, c/o nausea

## 2014-06-01 NOTE — Progress Notes (Signed)
Urine specimen gathered and sent to lab.

## 2014-06-01 NOTE — Plan of Care (Signed)
Problem: Moderate/High Fall Risk Score >/=15  Goal: Patient will remain free of falls  Outcome: Progressing

## 2014-06-01 NOTE — Discharge Instructions (Signed)
Diet: Heart healthy.     Activity: As tolerated.

## 2014-06-01 NOTE — Progress Notes (Signed)
Assumed care at 00:00.  Pt arrived to room via stretcher from ED.  Pt ambulated to bed from stretcher w/ steady gait.  Assessment complete.  No s/s of acute distress noted.  Pt states he had nausea and "dry heaves" in ED prior to coming to floor.  C/O pain 5/10 in umbilical region.  Last BM 05/31/14.  BS auscultated in all quadrants.    Medications administered per MAR.    Pt denies needs at present time.    Clean urinal placed at bedside for collection of urine specimen for labs.  Call bell within reach.  Bed in lowest, locked position.  Bed alarm activated.  Will continue to monitor.

## 2014-06-01 NOTE — Progress Notes (Signed)
Pt rested quietly throughout night.    No needs verbalized.    Will continue to monitor.

## 2014-06-01 NOTE — Progress Notes (Signed)
Pt was heard yelling from the hallway. Pt stated "I'm going home. My doctor told me I was allowed to. I don't know why I'm still here." I informed pt I would contact the MD to see if pts discharge is expected today. Dr. Pollie Friar stated she was "waiting to see what the [place] results showed but he is expected to go today". Pt informed. Will continue to monitor. Call bell and phone within reach.

## 2014-06-01 NOTE — Progress Notes (Signed)
[facility]  [address]  [place] [address]    INITIAL ASSESSMENT  Case Management / Social Work      Estimated D/C Date: 2/4      PCP/Last visit: Dr. William Hamburger      Home assessment/PLOF: Lives at home with wife. Independent. Works. Drives.      Financials and Insurance:  [employer]- has Furniture conservator/restorer: none      DME's/Supplier: none      Inpatient Plan of Care: Abdominal pain with unclear etiology. Mesenteric arterial duplex today. Continues Xarelto for afib. PRN hydralazine for elevated blood pressure.      CM Interventions: CM discussed [place] planning with patient at bedside. No [place] needs noted at time of screening. CM will follow clinical course.       Transportation: states has ride      Barriers to discharge:  none anticipated      D/C Plan and Needs:  Abdominal pain- for mesenteric arterial duplex. Anticipate [place] home without needs. CM to follow.    Suzan Slick Dorthy Cooler BSN, RN  Case Management  Extension [phone]

## 2014-06-01 NOTE — Discharge Summary (Signed)
Discharge Summary - [facility], [place]    Patient Name: Christopher Dickerson, Christopher SR.  Attending Physician: Esmond Harps, DO  Primary Care Physician: Tanna Savoy, MD    Date of Admission: 05/31/2014  Date of Discharge: 06/01/2014    Discharge Diagnosis:    Principal Problem:    Abdominal pain  Active Problems:    Hypertension    Atrial fibrillation    Nausea  Resolved Problems:    * No resolved hospital problems. *      Admission History and Physical:    Please refer to History and Physical from 05/31/2014  4:32 PM.    Hospital Course:   This is a 69 year old Caucasian male who presented to the ED on 05/31/14 due to abdominal pain. This has been intermittent and severe at times for nearly one month. The pain is peri-umbilical. He's had some associated dry heaves. He was observed in the hospital overnight for further workup. He has had duplex ultrasound of the mesenteric arteries. These were patent.  Additionally, he had CT of the abdomen and pelvis which was unremarkable. There was some concern of a bladder mass however clarified this with radiology. This actually represented a lobe of the prostate pushing into the bladder. He is up-to-date on his colonoscopy.    I explained to the patient the etiology of his abdominal pain is unknown. He does have hyperactive bowel sounds before this could be a functional problem. He does report having normal bowel movements. I have recommended for him to follow-up with gastroenterology service as an outpatient.    At this time the patient is stable for discharge with close follow up with PCP and gastroenterology. The plan has been discussed and agreed upon by patient.    Discharge Instruction:   Diet:  Heart healthy.    Activity:  As tolerated.     Follow-up Information     Follow up with Gustin, Alejandro Mulling, MD.    Specialty:  Family Medicine    Why:  Hospital follow up within 1 week.     Contact information:    [address]  [place] [address]  [phone]           Follow up with [facility].    Why:  Gastroenterology follow up regarding recurrent abdominal pain.    Contact information:    [address]  [place] [address]  [phone]        Complete instructions and follow up are in the patient's After Visit Summary (AVS).    Physical Examination:     Temp:  [96.1 F (35.6 C)-98.1 F (36.7 C)] 98.1 F (36.7 C)  Heart Rate:  [73-106] 89  Resp Rate:  [7-18] 18  BP: (112-155)/(73-105) 153/74 mmHg    General:  No acute distress.    CV:   Regular rate and rhythm, no murmurs.    Lungs:  Clear to auscultation bilaterally.       No accessory muscle use.    Abdomen:  Soft, non-tender, non-distended.  Bowel sounds are hyperactive.      No rebound or guarding    Ext:    No clubbing, cyanosis.     Skin:   Warm and dry.    Discharge Condition:     The patient was discharged in stable condition.    Time spent coordinating discharge and reviewing discharge plan:  Over 30 minutes.    Discharge Medications:       Medication  List      CONTINUE taking these medications          amLODIPine 5 MG tablet   Commonly known as:  NORVASC       benzonatate 100 MG capsule   Commonly known as:  TESSALON       famotidine 20 MG tablet   Commonly known as:  PEPCID   Take 1 tablet (20 mg total) by mouth 2 (two) times daily.       hydrochlorothiazide 12.5 MG capsule   Commonly known as:  MICROZIDE       lisinopril 40 MG tablet   Commonly known as:  PRINIVIL,ZESTRIL       omeprazole 40 MG capsule   Commonly known as:  PriLOSEC       ondansetron 4 MG disintegrating tablet   Commonly known as:  ZOFRAN-ODT   Take 1 tablet (4 mg total) by mouth every 8 (eight) hours as needed.       XARELTO 20 MG Tabs   Generic drug:  rivaroxaban             Consults:    Treatment Team: Attending Provider: Esmond Harps, DO; Technician: Guy Begin, CNA; Registered Nurse: Benjamine Mola, RN; Case Manager: Gerlene Fee, RN    Radiology:   Ct Abdomen Pelvis W  Iv And Po Cont    05/31/2014   Delayed imaging suggests apparent bladder lesion is related to the  prostate gland. No discrete suspicious mass lesion demonstrated. ReadingStation:[email or web address]    05/31/2014    1. Concern for enhancing lesion within the base of the urinary bladder. Patient will be asked to return for additional imaging through the pelvis and an addendum will be placed on this report that time.  2. No acute abdominal findings.  ReadingStation:[email or web address]    Xr Abdomen 2 View With Chest 1 View    05/31/2014   1. Air filled small bowel loops with gas seen into the colon findings which may represent ileus or partial obstruction. No free air is identified. 2. No focal parenchymal lung infiltrate pleural effusion or pneumothorax. Heart size upper normal. ReadingStation:[email or web address]    Signed by: Esmond Harps, DO    [facility]    CC: Gustin, Alejandro Mulling, MD

## 2014-06-01 NOTE — Plan of Care (Signed)
Problem: Pain  Goal: Patient's pain/discomfort is manageable  Outcome: Progressing

## 2014-06-06 NOTE — ED Provider Notes (Signed)
[facility]  EMERGENCY DEPARTMENT  History and Physical Exam     Patient Name: Christopher Dickerson, Christopher SR.  Encounter Date:  05/31/2014  Attending Physician: Caralyn Guile. Rachel Moulds, M.D.  Room:  523/523-A  Patient DOB:  01-Feb-1946  Age: 69 y.o. male  MRN:  [id number]  PCP: Tanna Savoy, MD      Diagnosis/Disposition:  MDM:     Final Impression  1. Acute abdominal pain        Disposition  ED Disposition     Observation Admitting Physician: Kerin Salen [[id number]]  Diagnosis: Abdominal pain [1190313]  Estimated Length of Stay: < 2 midnights  Tentative Discharge Plan?: Home-Health Care Svc [6]  Patient Class: Observation [104]            Follow up  Tanna Savoy, MD  [address]  [place] [address]  [phone]      Hospital follow up within 1 week.  FEBRUARY 10, AT 3:00 P.M.    [facility]  [address]  [place] [address]  [phone]    Gastroenterology follow up regarding recurrent abdominal pain.      Prescriptions  Discharge Medication List as of 06/01/2014  3:46 PM            Patient's abdominal pain likely secondary to pancreatitis. I see no other etiology for his pain.          The results of diagnostic studies have been reviewed by myself. Available past medical, family, social, and surgical histories have been reviewed by myself. The clinical impression and plan have been discussed with the patient and/or the patient's family. All questions have been answered.      History of Presenting Illness:     Chief complaint: Chest Pain and Abdominal Pain      Christopher Phillippi Sr. is a 69 y.o. male presenting with one week history of generalized abdominal pain. Patient states over the last week he has had persistent abdominal pain with distention. The patient states he's had some nausea but denies any vomiting. Patient also states he has been moving his bowels. He denies any fevers or chills. The patient states the pain may be  exacerbated by lying down and eating. He denies any previous similar symptoms.        Review of Systems:  Physical Exam:     Review of Systems   Constitutional: Negative for fever, chills and fatigue.   HENT: Negative for nosebleeds, rhinorrhea and sore throat.    Eyes: Negative for pain, discharge and visual disturbance.   Respiratory: Negative for cough and shortness of breath.    Cardiovascular: Negative for chest pain and palpitations.   Gastrointestinal: Positive for nausea, abdominal pain and abdominal distention. Negative for vomiting and diarrhea.   Genitourinary: Negative for dysuria, frequency and hematuria.   Musculoskeletal: Negative for myalgias, joint swelling and arthralgias.   Skin: Negative for rash.   Neurological: Negative for dizziness, syncope and headaches.   Hematological: Negative for adenopathy.   Psychiatric/Behavioral: Negative for dysphoric mood. The patient is not nervous/anxious.      Blood pressure 153/74, pulse 89, temperature 98.1 F (36.7 C), temperature source Oral, resp. rate 18, height 1.829 m, weight 98.9 kg, SpO2 97 %.    Physical Exam   Constitutional: He is oriented to person, place, and time. He appears well-developed and well-nourished.   HENT:   Head: Normocephalic and atraumatic.   Eyes: EOM are normal. Pupils  are equal, round, and reactive to light.   Neck: Neck supple. No JVD present. No tracheal deviation present.   Cardiovascular: Normal rate, regular rhythm and normal heart sounds.    Pulmonary/Chest: Effort normal and breath sounds normal.   Abdominal: Soft. Bowel sounds are normal. There is tenderness (midepigastrium). There is no rebound and no guarding.   Musculoskeletal: Normal range of motion.   Neurological: He is alert and oriented to person, place, and time.   Skin: Skin is warm and dry.   Psychiatric: He has a normal mood and affect.   Nursing note and vitals reviewed.         Allergies & Medications:     Pt has No Known Allergies.    Discharge Medication  List as of 06/01/2014  3:46 PM      CONTINUE these medications which have NOT CHANGED    Details   amLODIPine (NORVASC) 5 MG tablet Take 5 mg by mouth daily.   , Starting 10/07/2012, Until Discontinued, Historical Med      benzonatate (TESSALON) 100 MG capsule Take 100 mg by mouth 3 (three) times daily as needed.   , Starting 03/22/2014, Until Discontinued, Historical Med      famotidine (PEPCID) 20 MG tablet Take 1 tablet (20 mg total) by mouth 2 (two) times daily., Starting 04/30/2014, Until Discontinued, Print      hydrochlorothiazide (MICROZIDE) 12.5 MG capsule Take 12.5 mg by mouth every morning.   , Starting 10/07/2012, Until Discontinued, Historical Med      lisinopril (PRINIVIL,ZESTRIL) 40 MG tablet Take 40 mg by mouth daily.   , Starting 10/07/2012, Until Discontinued, Historical Med      omeprazole (PRILOSEC) 40 MG capsule Take 40 mg by mouth daily.   , Starting 02/02/2014, Until Discontinued, Historical Med      ondansetron (ZOFRAN-ODT) 4 MG disintegrating tablet Take 1 tablet (4 mg total) by mouth every 8 (eight) hours as needed., Starting 04/30/2014, Until Discontinued, Print      XARELTO 20 MG Tab Take 20 mg by mouth daily.   , Starting 03/22/2014, Until Discontinued, Historical Med               Past History:     Medical: Pt has a past medical history of Hypertension and Atrial fibrillation.    Surgical: Pt  has past surgical history that includes LIFT, ARM (MEDICAL); Knee arthroscopy w/ ACL reconstruction; Appendectomy; COLONOSCOPY, POLYPECTOMY (12/16/2012); and EGD, BIOPSY (12/16/2012).    Family: The family history is not on file.    Social: Pt reports that he has been smoking Cigarettes.  He has been smoking about 0.50 packs per day. He has never used smokeless tobacco. He reports that he drinks alcohol. He reports that he does not use illicit drugs.        Diagnostic Results:     Radiologic Studies  No results found.    Lab Studies  Labs Reviewed   PT AND APTT - Abnormal; Notable for the following:     PT  14.6 (*)     PT INR 1.4 (*)     aPTT 39.3 (*)     All other components within normal limits   COMPREHENSIVE METABOLIC PANEL - Abnormal; Notable for the following:     Glucose 117 (*)     All other components within normal limits   URINALYSIS WITH CULTURE REFLEX - Abnormal; Notable for the following:     Specific Gravity, UR 1.051 (*)  All other components within normal limits   CBC AND DIFFERENTIAL   TROPONIN I   LIPASE   COMPREHENSIVE METABOLIC PANEL   CBC AND DIFFERENTIAL   MAGNESIUM   PHOSPHORUS         Procedure/EKG:             ATTESTATIONS     Lyniah Fujita D. Rachel Moulds, M.D.             Tessie Fass, MD  06/06/14 [phone]

## 2015-03-23 ENCOUNTER — Emergency Department: Payer: Medicare PPO

## 2015-03-23 ENCOUNTER — Emergency Department
Admission: EM | Admit: 2015-03-23 | Discharge: 2015-03-23 | Disposition: A | Discharge status: Home / Self Care | Payer: Medicare PPO | Attending: Emergency Medicine | Admitting: Emergency Medicine

## 2015-03-23 DIAGNOSIS — R1013 Epigastric pain: Secondary | ICD-10-CM | POA: Insufficient documentation

## 2015-03-23 DIAGNOSIS — R110 Nausea: Secondary | ICD-10-CM | POA: Insufficient documentation

## 2015-03-23 DIAGNOSIS — R002 Palpitations: Secondary | ICD-10-CM | POA: Insufficient documentation

## 2015-03-23 DIAGNOSIS — I10 Essential (primary) hypertension: Secondary | ICD-10-CM | POA: Insufficient documentation

## 2015-03-23 DIAGNOSIS — R791 Abnormal coagulation profile: Secondary | ICD-10-CM | POA: Insufficient documentation

## 2015-03-23 DIAGNOSIS — I4891 Unspecified atrial fibrillation: Secondary | ICD-10-CM | POA: Insufficient documentation

## 2015-03-23 DIAGNOSIS — R42 Dizziness and giddiness: Secondary | ICD-10-CM | POA: Insufficient documentation

## 2015-03-23 LAB — CBC AND DIFFERENTIAL
Basophils %: 0.7 % (ref 0.0–3.0)
Basophils Absolute: 0.1 10*3/uL (ref 0.0–0.3)
Eosinophils %: 2.3 % (ref 0.0–7.0)
Eosinophils Absolute: 0.2 10*3/uL (ref 0.0–0.8)
Hematocrit: 52.7 % — ABNORMAL HIGH (ref 39.0–52.5)
Hemoglobin: 17.5 gm/dL (ref 13.0–17.5)
Lymphocytes Absolute: 1.8 10*3/uL (ref 0.6–5.1)
Lymphocytes: 18.7 % (ref 15.0–46.0)
MCH: 31 pg (ref 28–35)
MCHC: 33 gm/dL (ref 32–36)
MCV: 92 fL (ref 80–100)
MPV: 7.7 fL (ref 6.0–10.0)
Monocytes Absolute: 0.7 10*3/uL (ref 0.1–1.7)
Monocytes: 7.3 % (ref 3.0–15.0)
Neutrophils %: 71 % (ref 42.0–78.0)
Neutrophils Absolute: 6.8 10*3/uL (ref 1.7–8.6)
PLT CT: 251 10*3/uL (ref 130–440)
RBC: 5.72 10*6/uL — ABNORMAL HIGH (ref 4.00–5.70)
RDW: 12.9 % (ref 11.0–14.0)
WBC: 9.6 10*3/uL (ref 4.0–11.0)

## 2015-03-23 LAB — COMPREHENSIVE METABOLIC PANEL
ALT: 25 U/L (ref 0–55)
AST (SGOT): 19 U/L (ref 10–42)
Albumin/Globulin Ratio: 1.19 Ratio (ref 0.70–1.50)
Albumin: 3.8 gm/dL (ref 3.5–5.0)
Alkaline Phosphatase: 84 U/L (ref 40–145)
Anion Gap: 13.9 mMol/L (ref 7.0–18.0)
BUN / Creatinine Ratio: 16 Ratio (ref 10.0–30.0)
BUN: 19 mg/dL (ref 7–22)
Bilirubin, Total: 0.9 mg/dL (ref 0.1–1.2)
CO2: 24.1 mMol/L (ref 20.0–30.0)
Calcium: 9 mg/dL (ref 8.5–10.5)
Chloride: 104 mMol/L (ref 98–110)
Creatinine: 1.19 mg/dL (ref 0.80–1.30)
EGFR: >60 mL/min/{1.73_m2}
Globulin: 3.2 gm/dL (ref 2.0–4.0)
Glucose: 121 mg/dL — ABNORMAL HIGH (ref 70–99)
Osmolality Calc: 279 mOsm/kg (ref 275–300)
Potassium: 4 mMol/L (ref 3.5–5.3)
Protein, Total: 7 gm/dL (ref 6.0–8.3)
Sodium: 138 mMol/L (ref 136–147)

## 2015-03-23 LAB — VH URINALYSIS WITH MICROSCOPIC
Bilirubin, UA: NEGATIVE
Blood, UA: NEGATIVE
Glucose, UA: NEGATIVE mg/dL
Ketones UA: NEGATIVE mg/dL
Leukocyte Esterase, UA: NEGATIVE Leu/uL
Nitrite, UA: NEGATIVE
Protein, UR: 30 mg/dL — AB
RBC, UA: <1 /hpf (ref 0–4)
Urine Specific Gravity: 1.015 (ref 1.001–1.040)
Urobilinogen, UA: NORMAL mg/dL
WBC, UA: <1 /hpf (ref 0–4)
pH, Urine: 5 pH (ref 5.0–8.0)

## 2015-03-23 LAB — ECG 12-LEAD
Patient Age: 69 years
Q-T Interval(Corrected): 505 ms
Q-T Interval: 360 ms
QRS Axis: 80 deg
QRS Duration: 113 ms
T Axis: -10 years
Ventricular Rate: 118 //min

## 2015-03-23 LAB — TROPONIN I: Troponin I: <0.01 ng/mL (ref 0.00–0.02)

## 2015-03-23 LAB — LACTIC ACID, PLASMA: Lactic Acid: 1.7 mMol/L (ref 0.5–2.1)

## 2015-03-23 LAB — PT/INR
PT INR: 1.3 (ref 0.5–1.3)
PT: 14.4 s — ABNORMAL HIGH (ref 9.5–11.5)

## 2015-03-23 LAB — LIPASE: Lipase: 17 U/L (ref 8–78)

## 2015-03-23 LAB — SEDIMENTATION RATE: Sed Rate: 5 mm/hr (ref 0–20)

## 2015-03-23 MED ORDER — METOPROLOL TARTRATE 1 MG/ML IV SOLN
INTRAVENOUS | Status: AC
Start: 2015-03-23 — End: ?
  Filled 2015-03-23: qty 5

## 2015-03-23 MED ORDER — ASPIRIN 325 MG PO TABS
ORAL_TABLET | ORAL | Status: AC
Start: 2015-03-23 — End: ?
  Filled 2015-03-23: qty 1

## 2015-03-23 MED ORDER — METOPROLOL TARTRATE 1 MG/ML IV SOLN
5.0000 mg | Freq: Once | INTRAVENOUS | Status: AC
Start: 2015-03-23 — End: 2015-03-23
  Administered 2015-03-23: 2.5 mg via INTRAVENOUS

## 2015-03-23 MED ORDER — ASPIRIN 81 MG PO CHEW
CHEWABLE_TABLET | ORAL | Status: AC
Start: 2015-03-23 — End: ?
  Filled 2015-03-23: qty 4

## 2015-03-23 MED ORDER — LANSOPRAZOLE 30 MG PO CPDR
30.0000 mg | DELAYED_RELEASE_CAPSULE | Freq: Every day | ORAL | Status: DC
Start: 2015-03-23 — End: 2017-04-15

## 2015-03-23 MED ORDER — ATENOLOL 25 MG PO TABS
25.0000 mg | ORAL_TABLET | Freq: Two times a day (BID) | ORAL | Status: DC
Start: 2015-03-23 — End: 2017-04-15

## 2015-03-23 MED ORDER — LISINOPRIL 40 MG PO TABS
20.0000 mg | ORAL_TABLET | Freq: Every day | ORAL | Status: DC
Start: 2015-03-23 — End: 2017-04-15

## 2015-03-23 MED ORDER — ASPIRIN 325 MG PO TABS
325.0000 mg | ORAL_TABLET | Freq: Once | ORAL | Status: AC
Start: 2015-03-23 — End: 2015-03-23
  Administered 2015-03-23: 325 mg via ORAL

## 2015-03-23 MED ORDER — NON FORMULARY
10.0000 mg | Freq: Once | Status: DC
Start: 2015-03-23 — End: 2015-03-23

## 2015-03-23 MED ORDER — WARFARIN SODIUM 10 MG PO TABS
10.0000 mg | ORAL_TABLET | Freq: Once | ORAL | Status: DC
Start: 2015-03-23 — End: 2015-03-23
  Filled 2015-03-23: qty 1

## 2015-03-23 MED ORDER — ATENOLOL 50 MG PO TABS
ORAL_TABLET | ORAL | Status: AC
Start: 2015-03-23 — End: ?
  Filled 2015-03-23: qty 1

## 2015-03-23 MED ORDER — VH HYDROMORPHONE HCL 1 MG/ML (NARRATOR)
INTRAMUSCULAR | Status: AC
Start: 2015-03-23 — End: ?
  Filled 2015-03-23: qty 1

## 2015-03-23 MED ORDER — ENOXAPARIN SODIUM 100 MG/ML SC SOLN
1.0000 mg/kg | Freq: Two times a day (BID) | SUBCUTANEOUS | Status: DC
Start: 2015-03-23 — End: 2015-03-23
  Administered 2015-03-23: 100 mg via SUBCUTANEOUS

## 2015-03-23 MED ORDER — ATENOLOL 50 MG PO TABS
25.0000 mg | ORAL_TABLET | Freq: Once | ORAL | Status: AC
Start: 2015-03-23 — End: 2015-03-23
  Administered 2015-03-23: 25 mg via ORAL

## 2015-03-23 MED ORDER — SODIUM CHLORIDE 0.9 % IV BOLUS
1000.0000 mL | Freq: Once | INTRAVENOUS | Status: AC
Start: 2015-03-23 — End: 2015-03-23
  Administered 2015-03-23: 1000 mL via INTRAVENOUS

## 2015-03-23 MED ORDER — WARFARIN SODIUM 5 MG PO TABS
ORAL_TABLET | ORAL | Status: DC
Start: 2015-03-23 — End: 2017-04-15

## 2015-03-23 MED ORDER — ONDANSETRON HCL 4 MG/2ML IJ SOLN
INTRAMUSCULAR | Status: AC
Start: 2015-03-23 — End: ?
  Filled 2015-03-23: qty 2

## 2015-03-23 MED ORDER — ENOXAPARIN SODIUM 100 MG/ML SC SOLN
SUBCUTANEOUS | Status: AC
Start: 2015-03-23 — End: ?
  Filled 2015-03-23: qty 1

## 2015-03-23 MED ORDER — VH HYDROMORPHONE HCL PF 1 MG/ML CARPUJECT
1.0000 mg | Freq: Once | INTRAMUSCULAR | Status: AC
Start: 2015-03-23 — End: 2015-03-23
  Administered 2015-03-23: 1 mg via INTRAVENOUS

## 2015-03-23 MED ORDER — ONDANSETRON HCL 4 MG/2ML IJ SOLN
4.0000 mg | Freq: Once | INTRAMUSCULAR | Status: AC
Start: 2015-03-23 — End: 2015-03-23
  Administered 2015-03-23: 4 mg via INTRAVENOUS

## 2015-03-23 NOTE — ED Notes (Signed)
Pt presents to ER for diffuse abdominal pain that started Monday evening. Pt denies vomiting, diarrhea, SOB, fever or recent illness. Pt states he developed a dry cough this morning. Pt's also has c/o dizziness starting last week at work, pt states he "blacked out" but did not pass out. Dizziness occurs "sometimes" when pt stands up.

## 2015-03-23 NOTE — Special Discharge Instructions (Addendum)
YOU NEED TO STOP THE AMLODIPINE AND TAKE ONLY HALF TABLET OF THE LISINOPRIL FOR NOW (I HAVE NEW SCRIPT AS WELL). START TAKING THE NEW BLOOD PRESSURE MEDICINE (ATENOLOL) AND TAKE 7.5MG  OF COUMADIN NOW ON Monday AND Wednesday AND Saturday NOW.

## 2015-03-23 NOTE — ED Notes (Signed)
Bed: C14-A  Expected date:   Expected time:   Means of arrival:   Comments:  Ems

## 2015-03-23 NOTE — Discharge Instructions (Signed)
Abdominal Pain,Uncertain Cause [Male]  Based on your visit today, the exact cause of your abdominalpain is not clear. Your exam and tests do not indicate a dangerous cause at this time. However, the signs of a serious problem may take more time to appear. Although your evaluation was reassuring today, sometimes early in the course of many conditions, exam and lab tests can appear normal. Therefore, it is important for you to watch for any new symptoms or worsening of your condition.  Causes  It may not be obvious what caused your symptoms. Pay attention to things that do seem to make your symptoms worse or better and discuss this with your doctor when you follow up.  Diagnosis  The evaluation of abdominal pain in the emergency department may onlyrequire an exam by the doctor or it may include blood, urine or imaging studies, depending on many factors. Sometimes exams and tests can identify a cause but in many cases, a clear cause is not found. Further testing at follow up visits may help to suggest a clear diagnosis.  Home Care   Rest as much as possible until your next exam.   Try to avoid any medications (unless otherwise directed by your doctor), foods, activities, or other factors that you may have contributed to your symptoms.   Try to eat foods that you know that you have tolerated well in the past. Certain diets may be recommended for some conditions that cause abdominal pain. However, since the cause of your symptoms may not be clear, discuss your diet more with your primary care provider or specialist for further recommendations.   Eating several small meals per day as opposed to 2 or 3 larger meals may help.   Monitor closely for anything that may make your symptoms worse or better. Pay close attention to symptoms below that may indicate worsening of your condition.  Follow Up and Precautions  See your doctoras instructed or sooneror if your symptoms are not improving.In some cases, you may  need more testing.  When to Seek Medical Attention  Contact your doctor or see medical attention ifany of the following occur:   Pain is becoming worse   You are unable to take your medications due to excessive vomiting   Swelling of the abdomen   Fever of 100.37F (38C) or higher, or as directed by your health care provider   Blood in vomit or bowel movements (dark red or black color)   Jaundice (yellow color of eyes and skin)   New onset of weakness, dizziness or fainting   New onset of chest, arm, back, neck or jaw pain   2000-2015 [employer] [address], [place], [place] [address]. All rights reserved. This information is not intended as a substitute for professional medical care. Always follow your healthcare professional's instructions.          Discharge Instructions for Atrial Fibrillation  You have been diagnosed with atrial fibrillation.With this condition, your heart'stwo upper chambers quiver rather than squeeze the blood out in a normal pattern. This leads to an irregular and sometimes rapid heartbeat. Some people will develop associated symptoms such as a flip flopping heartbeat, lightheadedness or shortness of breath. Other people may have no symptoms at all. Atrial fibrillation is serious since it affects the heart's ability to fill with blood as it should. Blood clots may form; this increases the risk of stroke. Untreated atrial fibrillation can also lead to heart failure. Atrial fibrillation can be controlled. With  individualized treatment, mostpeople with atrial fibrillation lead normal lives. It is estimated that over 2.5 million Americans have atrial fibrillation.  Treatment Options  Recommended treatment for atrial fibrillation depends on your age, symptoms, duration of atrial fibrillation and other factors. You will undergo a complete evaluation to determine if you have any abnormalities that caused your heart to go into atrial fibrillation, such as blocked heart  arteries or a thyroid problem. Your doctor will assess your particular situation and discuss options with you.  Treatment options may include:   Treating an underlyingdisorder that puts you at risk for atrial fibrillation. For example, correcting an abnormal thyroid or electrolyte problem, or treating a blocked heart artery.   Restoring a normal heart rhythm with an electrical shock (cardioversion) or with an antiarrhythmic medication (chemical cardioversion)   Using medication to control your heart rate in atrial fibrillation.   Preventing therisk of blood clot and stroke using blood thinning medications. Your doctor will provide education on what is recommended for you. Options may include aspirin, clopidogrel, warfarin, dabigatran, rivaroxaban, or apixaban.   Catheter ablation and maze procedure use different methods to destroy certain areas of heart tissue to interupt the electrical signals responsible for atrial fibrillation.One of these procedures may be anoption whenmedications are not effective.   Other treatment options may be recommended for you by your doctor.  Managing risk factors for stroke and preventing heart failure are essential components of any atrial fibrillationtreatment plan.  Home Care   Take your medications exactly as directed. Don't skip doses.   Work with your doctor to determine the proper medications and doses.   Learn to take your own pulse. Keep a record of your results. Ask your doctor which pulse rates mean that you need medical attention. Slowing your pulse is often the goal of treatment. Ask your doctor if it's okay for you to use an automatic machine to check your pulse at home. Sometimes these machines don't count the pulse correctly when you have atrial fibrillation.   Limit your intake of coffee, tea, cola, and other beverages with caffeine to2per day. Talkwith your doctor about whether you should eliminate caffeine.   Avoid over-the-counter medications  that contain caffeine.   Let your doctor know what medication you take, including prescription and over-the-counter as well as any supplements. They interfere with some medications given for atrial fibrillation.   Ask your doctor about whether or not you can drink alcohol. Sometimes alcohol needs to be avoided to better treat atrial fibrillation. If you are taking blood-thinner medications, alcohol may interfere with them by increasing their effect.   Never take stimulants such as amphetamines or cocaine. These drugs can speed upyour heart rate and trigger atrial fibrillation.  Follow-Up  Make a follow-up appointment as directed by our staff.     [address] [employer] [address] [place], [place] [address]. All rights reserved. This information is not intended as a substitute for professional medical care. Always follow your healthcare professional's instructions.          What Is Atrial Flutter/Atrial Fibrillation?      The heart has its own electrical system. This system makes the signals that start each heartbeat. The heartbeat begins in1 of the2 upper chambers of the heart (atria). A problem can make the atria beat faster than normal. The atria may beat fast but still evenly. This problem is called atrial flutter. If the atria beat very fast and also unevenly, it is called atrial fibrillation (  AFib).  Causes of Atrial Flutter and Atrial Fibrillation  Causes of these problems can include:   Previous heart attack   High blood pressure   Thyroid problems  In many cases, the cause is unknown.  When the Atria Beat Too Fast  The atria may beat fast only once in a while. This is called a paroxysmal heart rhythm problem. If they beat fast all the time, it is a chronic problem.  Atrial Flutter  With atrial flutter, electrical signals travel around and around inside the atria. These circling signals make the atria beat too fast:   Atrial flutter can cause symptoms similar to AFib. It can also  lead to the even faster, uneven rhythms of AFib.  Atrial Fibrillation (AFib)  With AFib, cells in the atria send extra electrical signals. These extra signals make the atria beat very fast. They also beat unevenly:   The atria beat so fast and unevenly that they may quiver instead of contracting. If the atria don't contract, they don't move enough blood into the2 lower chambers of the heart (ventricles). This can cause you to feel dizzy or weak.   Blood that doesn't keep moving can pool and form clots in the atria. These clots can move into other parts of the body and cause serious problems such as a stroke.     [address] [employer] [address] [place], [place] [address]. All rights reserved. This information is not intended as a substitute for professional medical care. Always follow your healthcare professional's instructions.          Epigastric Pain (Uncertain Cause)  Epigastric pain can be a sign of disease in the upper abdomen. Common causes include:     Acid reflux (stomach acid flowing up into the esophagus)   Gastritis (irritation of the stomach lining)   Peptic Ulcer Disease   Inflammation of the pancreas   Gallstone   Infection in the gallbladder  Pain may be dull or burning. It may spread upward to the chest or to the back. There may be other symptoms such as belching, bloating, cramps or hunger pains. There may be weight loss or poor appetite, nausea or vomiting.  Since the diagnosis of your pain is not certain yet, further tests will be needed. Sometimes the doctor will treat you for the most likely condition to see if there is improvement before doing further tests.  Home Care:   Unless told otherwise, you may try antacids (Mylanta or Maalox) help neutralize stomach acid. This may relieve your pain. Take 1-2 tablespoons or tablets one hour after meals and at bedtime. The liquid form coats the stomach better than the chewable tablets and is preferred. If Tagamet  (cimetidine), Zantac (ranitidine), or Carafate (sucralfate) has also been prescribed, allow one hour between taking this medicine and taking the antacids.   Avoid foods that irritate the stomach. Follow a light diet until you are feeling better.   Avoid alcohol, caffeine, and tobacco. Talk to your doctor before taking any over-the-counter medicine that contains aspirin or an anti-inflammatory drug such as ibuprofen, Advil, Motrin, Naprosyn, or Aleve.  Follow Up  with your doctor or as advised if you do not improve over the next 48 hours.  Get Prompt Medical Attention  if any of the following occur:   Stomach pain worsens or moves to the right lower part of the abdomen   Chest pain appears, or if it worsens or spreads to the chest, back, neck, shoulder,  or arm   Frequent vomiting (can't keep down liquids)   Blood in the stool or vomit (red or black color)   Feeling weak or dizzy, fainting, or having trouble breathing   Fever of 100.52F (38C) or higher, or as directed by your healthcare provider   Abdominal swelling   2000-2015 [employer] [address] [place], [place] [address]. All rights reserved. This information is not intended as a substitute for professional medical care. Always follow your healthcare professional's instructions.          Atrial Fibrillation    Atrial fibrillation is a condition in which the heart beats in an irregular pattern. It is caused by a problem in the heart's electrical pathways. It is a sign of heart disease or other health problems that affect the heart.  Heart palpitations are the most common symptom of atrial fibrillation. This is the feeling that your heart is fluttering, beating fast, hard, or irregular. When the heart beats too fast, it doesn't pump blood very well. This can cause other symptoms like anxiety, fatigue, shortness of breath, chest pain, dizziness, or fainting. Atrial fibrillation may come and go. It can last from a few hours to a couple  of days. Or, it may become chronic, lasting for months at a time or longer.  Atrial fibrillation may be caused by heart disease or other conditions in the body that affect the heart:   Coronary artery disease (atherosclerosis)   High blood pressure   Disease of the heart valves   Enlarged heart   Congestive heart failure  Atrial fibrillation can also occurwithout heart disease because of:   Overactive thyroid (hyperthyroid)   Chronic lung disease (COPD, emphysema, bronchitis)   Heavy alcohol use   Cardiac stimulants like cocaine, amphetamines, diet pills, certain decongestant cold medicines, caffeine, or nicotine   Infection   Blood clot in the lung (pulmonary embolus)   Diabetes   Chronic kidney disease   Obesity  Treating or removing these causes will help your treatment for atrial fibrillation. It will also make it less likely for the atrial fibrillation to come back.  Atrial fibrillation can alternate back and forth with another abnormal rhythm calledatrial flutter.The risk for stroke goes up with either of these conditions. Proper treatment can lower your risk for stroke.  Home care  Follow these guidelines when caring for yourself at home:   Go back to your usual activities as soon as you are feeling back to normal.   If you smoke, stop smoking. Contact your health care provider or a local stop-smoking program for help.   Don't use stimulants like alcohol,cocaine, amphetamines, diet pills, certain decongestant cold medicines, caffeine, or nicotine.   If your provider prescribed medicine to stop atrial fibrillation from coming back, take it exactly as directed. Some medicines must be taken every day, not just when you have symptoms. This will help them work as they should.   If you were prescribed warfarin to lower your risk for stroke, have your blood tested on a regular basis as advised by your provider. This will make sure you are getting the dose that is right for you. It also lower  your risk for side effects.  Follow-up care  Follow up with your health care provider, or as advised.  When to seek medical advice  Matina Rodier your health care provider right away if any of these following occur:   Shortness of breath or swelling in the legs gets worse  Unexpected weight gain   Chest pain or the sense that your heart is fluttering or beating fast or hard (palpitations)   Any sign of bleeding if you are on a blood thinner   Pain, redness, or swelling in one leg  Also Ily Denno your provider right away if you have these signs of stroke:   Weakness of an arm or leg or one side of the face   Difficulty with speech or vision   Extreme drowsiness, confusion, dizziness, or fainting   2000-2015 [employer] [address], [place], [place] [address]. All rights reserved. This information is not intended as a substitute for professional medical care. Always follow your healthcare professional's instructions.

## 2015-03-23 NOTE — ED Provider Notes (Signed)
[facility] EMERGENCY DEPARTMENT History and Physical Exam      Patient Name: Christopher, Christopher SR.  Encounter Date:  03/23/2015  Attending Physician: Hall Busing Imer Foxworth, MD  PCP: Tanna Savoy, MD  Patient DOB:  02-Jul-1945  MRN:  [id number]  Room:  C14/C14-A      History of Presenting Illness     Chief complaint: Abdominal Pain    HPI/ROS is limited by: none  HPI/ROS given by: patient    Location: Abdomen  Duration: 3 days  Severity: moderate    Christopher Nyman Sr. is a 69 y.o. male who presents with diffuse abdominal pain. Reports onset 3 days ago. Describes his pain as a sharp sensation. His pain is will be intermittently worsened after eating. This does not occur every time he eats. He states he has had an episode of diarrhea since his symptoms began. He denies vomiting. He reports a dry cough beginning today. He also reports intermittent dizziness over the past week that seems to be worse when he goes from sitting to standing. He states when he gets really dizzy his vision will "black out momentarily". Denies syncope. He called EMS due to his symptoms and also reports nausea to them. They administered 4mg  Zofran and gave him fluids. Upon doing an EKG he was found to be in atrial fibrillation which he does has a history of. He takes Coumadin for this. He denies chest pain or shortness of breath at this time but does c/o chest tightness.    Review of Systems     Review of Systems   Constitutional: Negative for fever and chills.   Eyes: Positive for visual disturbance.   Respiratory: Positive for chest tightness. Negative for cough and shortness of breath.    Cardiovascular: Positive for palpitations (atrial fibrillation). Negative for chest pain.   Gastrointestinal: Positive for nausea, abdominal pain and diarrhea. Negative for vomiting, constipation and abdominal distention.   Musculoskeletal: Negative for myalgias, back pain and arthralgias.   Skin: Negative for rash and wound.   Neurological:  Positive for dizziness. Negative for syncope, weakness, light-headedness, numbness and headaches.   Psychiatric/Behavioral: The patient is not nervous/anxious.         Allergies     Pt has No Known Allergies.    Medications     Current Outpatient Rx   Name  Route  Sig  Dispense  Refill   . amLODIPine (NORVASC) 5 MG tablet    Oral    Take 5 mg by mouth daily.                [name] HYDROcodone-acetaminophen (NORCO) 5-325 MG per tablet    Oral    Take 1 tablet by mouth every 6 (six) hours as needed for Pain.                [name] omeprazole (PRILOSEC) 40 MG capsule    Oral    Take 40 mg by mouth daily.                [name] DISCONTD: hydrochlorothiazide (MICROZIDE) 12.5 MG capsule    Oral    Take 12.5 mg by mouth every morning.                [name] DISCONTD: lisinopril (PRINIVIL,ZESTRIL) 40 MG tablet    Oral    Take 40 mg by mouth daily.                [name] DISCONTD: warfarin (COUMADIN) 5 MG  tablet    Oral    Take 5 mg by mouth daily. Wednesday and Saturday pt takes 7.5mg  (1.5 tablets)               . atenolol (TENORMIN) 25 MG tablet    Oral    Take 1 tablet (25 mg total) by mouth 2 (two) times daily.    60 tablet    6     . lansoprazole (PREVACID) 30 MG capsule    Oral    Take 1 capsule (30 mg total) by mouth daily.    30 capsule    0     . lisinopril (PRINIVIL,ZESTRIL) 40 MG tablet    Oral    Take 0.5 tablets (20 mg total) by mouth daily.    30 tablet    6     . warfarin (COUMADIN) 5 MG tablet        Monday AND Wednesday and Saturday pt takes 7.5mg  (1.5 tablets)    30 tablet    6     . DISCONTD: benzonatate (TESSALON) 100 MG capsule    Oral    Take 100 mg by mouth 3 (three) times daily as needed.                [name] DISCONTD: cyclobenzaprine (FLEXERIL) 5 MG tablet                     . DISCONTD: famotidine (PEPCID) 20 MG tablet    Oral    Take 1 tablet (20 mg total) by mouth 2 (two) times daily.    60 tablet    1     . DISCONTD: ondansetron (ZOFRAN-ODT) 4 MG disintegrating tablet    Oral    Take 1 tablet (4 mg total) by mouth every 8 (eight)  hours as needed.    15 tablet    0     . DISCONTD: XARELTO 20 MG Tab    Oral    Take 20 mg by mouth daily.                  Dispense as written.          Past Medical History     Pt has a past medical history of Hypertension and Atrial fibrillation.    Past Surgical History     Pt has past surgical history that includes LIFT, ARM (MEDICAL); Knee arthroscopy w/ ACL reconstruction; Appendectomy; COLONOSCOPY, POLYPECTOMY (12/16/2012); and EGD, BIOPSY (12/16/2012).    Family History     The family history is not on file.    Social History     Pt reports that he has been smoking Cigarettes.  He has been smoking about 0.50 packs per day. He has never used smokeless tobacco. He reports that he drinks alcohol. He reports that he does not use illicit drugs.    Physical Exam     Blood pressure 145/95, pulse 58, temperature 97.9 F (36.6 C), temperature source Oral, resp. rate 18, height 1.829 m, weight 104.327 kg, SpO2 97 %.    Physical Exam   Constitutional: He is oriented to person, place, and time. He appears well-developed and well-nourished. No distress.   HENT:   Head: Normocephalic and atraumatic.   Right Ear: Hearing, tympanic membrane and ear canal normal.   Left Ear: Hearing, tympanic membrane and ear canal normal.   Mouth/Throat: Oropharynx is clear and moist and mucous membranes are normal. No oropharyngeal exudate or posterior oropharyngeal erythema.  Eyes: Conjunctivae are normal. Pupils are equal, round, and reactive to light.   Neck: Normal range of motion. Neck supple. No JVD present. Carotid bruit is not present.   Cardiovascular: An irregular rhythm present. Tachycardia present.    Pulmonary/Chest: Effort normal and breath sounds normal. He has no wheezes. He has no rales.   Abdominal: Soft. Bowel sounds are increased. There is generalized tenderness.   Diffuse abdominal tenderness that seems to be worse in the epigastric and RUQ areas. Negative heel tap. No pain exacerbation when bending and rotating lower  extremities.    Musculoskeletal:        Right lower leg: He exhibits no tenderness and no swelling.        Left lower leg: He exhibits no tenderness and no swelling.   Neurological: He is alert and oriented to person, place, and time.   Skin: Skin is warm and dry. No rash noted. He is not diaphoretic.   Psychiatric: He has a normal mood and affect.   Nursing note and vitals reviewed.       Orders Placed     Orders Placed This Encounter   Procedures   . XR Chest AP Portable   . US Abdomen Complete   . Comprehensive metabolic panel   . CBC and differential   . Lipase   . Urinalysis with Microscopic   . Troponin I (Stat)   . Lactic acid   . ESR   . PT/INR   . ECG 12 lead (Stat)   . Saline lock IV       Diagnostic Results       The results of the diagnostic studies below have been reviewed by myself:    Labs  Results     Procedure Component Value Units Date/Time    Urinalysis with Microscopic [[id number]]  (Abnormal) Collected:  03/23/15 0929    Specimen Information:  Urine, Random Updated:  03/23/15 1134     Color, UA Yellow      Clarity, UA Clear      Specific Gravity, UR 1.015      pH, Urine 5.0 pH      Protein, UR 30 (A) mg/dL      Glucose, UA Negative mg/dL      Ketones UA Negative mg/dL      Bilirubin, UA Negative      Blood, UA Negative      Nitrite, UA Negative      Urobilinogen, UA Normal mg/dL      Leukocyte Esterase, UA Negative Leu/uL      UR Micro Performed      WBC, UA <1 /hpf      RBC, UA <1 /hpf     Lactic acid [[id number]] Collected:  03/23/15 1032    Specimen Information:  Blood Updated:  03/23/15 1052     Lactic acid 1.7 mMol/L     PT/INR [[id number]]  (Abnormal) Collected:  03/23/15 0934    Specimen Information:  Blood Updated:  03/23/15 1021     PT 14.4 (H) sec      PT INR 1.3     Troponin I (Stat) [[id number]] Collected:  03/23/15 0934    Specimen Information:  Plasma Updated:  03/23/15 1020     Troponin I <0.01 ng/mL     Comprehensive metabolic panel [[id number]]  (Abnormal) Collected:  03/23/15 0934     Specimen Information:  Plasma Updated:  03/23/15 1019     Sodium 138 mMol/L  Potassium 4.0 mMol/L      Chloride 104 mMol/L      CO2 24.1 mMol/L      Calcium 9.0 mg/dL      Glucose 161 (H) mg/dL      Creatinine 0.96 mg/dL      BUN 19 mg/dL      Protein, Total 7.0 gm/dL      Albumin 3.8 gm/dL      Alkaline Phosphatase 84 U/L      ALT 25 U/L      AST (SGOT) 19 U/L      Bilirubin, Total 0.9 mg/dL      Albumin/Globulin Ratio 1.19 Ratio      Anion Gap 13.9 mMol/L      BUN/Creatinine Ratio 16.0 Ratio      EGFR >60 mL/min/1.66m2      Osmolality Calc 279 mOsm/kg      Globulin 3.2 gm/dL     Lipase [[id number]] Collected:  03/23/15 0934    Specimen Information:  Plasma Updated:  03/23/15 1019     Lipase 17 U/L     ESR [[id number]] Collected:  03/23/15 0934    Specimen Information:  Blood Updated:  03/23/15 1015     Sed Rate 5 mm/hr     CBC and differential [[id number]]  (Abnormal) Collected:  03/23/15 0934    Specimen Information:  Blood from Blood Updated:  03/23/15 0948     WBC 9.6 K/cmm      RBC 5.72 (H) M/cmm      Hemoglobin 17.5 gm/dL      Hematocrit 46.9 (H) %      MCV 92 fL      MCH 31 pg      MCHC 33 gm/dL      RDW 62.9 %      PLT CT 251 K/cmm      MPV 7.7 fL      NEUTROPHIL % 71.0 %      Lymphocytes 18.7 %      Monocytes 7.3 %      Eosinophils % 2.3 %      Basophils % 0.7 %      Neutrophils Absolute 6.8 K/cmm      Lymphocytes Absolute 1.8 K/cmm      Monocytes Absolute 0.7 K/cmm      Eosinophils Absolute 0.2 K/cmm      BASO Absolute 0.1 K/cmm           Radiologic Studies  Radiology Results (24 Hour)     Procedure Component Value Units Date/Time    XR Chest AP Portable [[id number]] Collected:  03/23/15 1304    Order Status:  Completed Updated:  03/23/15 1306    Narrative:      Clinical History:  Reason For Exam:  Shortness of Breath  Chest pain and dizziness for a couple of days. History of hypertension and atrial fibrillation.    Examination:  Frontal view of the chest.    Comparison:  Acute abdominal series  May 31, 2014    Findings:  There is stable atherosclerotic calcification of the aorta.  The heart is normal size and configuration.   Pulmonary vascularity normal.   Lungs clear and normally aerated.   No pleural effusions or pneumothorax.   There is stable mild degenerative changes of the acromioclavicular joints.   Soft-tissues unremarkable.      Impression:      No acute cardiopulmonary disease.    ReadingStation:[id number]    US Abdomen Complete [[id number]] Collected:  03/23/15 1229    Order Status:  Completed Updated:  03/23/15 1234    Narrative:      Clinical history:   Abdominal pain    Examination:   US ABDOMEN COMPLETE           Comparison:  1.  Complete abdomen ultrasound June 13, 2011  2.  CT abdomen pelvis with contrast May 31, 2014    Findings:  Gallbladder: The gallbladder is physiologically distended without evidence of stones.  The gallbladder wall does not appear thickened. There is no pericholecystic fluid.  A sonographic Murphy's sign is absent.  Common bile duct: There is stable chronic mild dilation of the common bile duct. The diameter of the common bile duct is  8 mm today, compared with 7 mm on the prior ultrasound from 2013 and 10 mm on the prior CT from earlier this year.  Again, no obstructing lesion (stone or mass) is identified.  Liver: The liver is normal in size and echogenicity.  No evidence of hepatic mass or dilated intrahepatic ducts.  Pancreas: The pancreas appears normal in size with no focal abnormalities.  Kidneys: The kidneys are normal in size and echotexture with no hydronephrosis, mass, scarring, or stone. The right  kidney measures 9.8 cm.  The left kidney measures 12.1 cm. There is stable small benign simple cysts in both kidneys  measuring up to 2.1 cm in size.  Spleen: The spleen appears unremarkable.  Abdominal aorta: Unremarkable.  Inferior vena cava: Unremarkable.  Main portal vein: Patent with normal direction of flow (toward the liver,  hepatopedal).  Ascites: None.      Impression:      IMPRESSION:   1.  No acute abnormality.  2.  Stable chronic mild dilation the common bile duct. Again, no obstructing lesion (stone or mass) is identified.  3.  Stable small benign simple cysts in both kidneys.      ReadingStation:[id number]            MDM / Critical Care     Blood pressure 145/95, pulse 58, temperature 97.9 F (36.6 C), temperature source Oral, resp. rate 18, height 1.829 m, weight 104.327 kg, SpO2 97 %.    The patient presents with abdominal pain without signs of peritonitis or other life-threatening or serious etiology. Many etiologies of the pain were considered such as appendicitis, bowel obstruction, and thought unlikely based on evaluation and presentation.  However the patient was given precautions and instructions to return if symptoms worsen or change in any way, or in 8-12 hours if not improved for re-evaluation.  Follow-up with the patient's primary care physician was encouraged.  Patient was well-appearing on serial reevaluation at time of disposition and given abdominal pain precautions.  Diagnostic impression and plan were discussed with the patient and/or family.  Results of lab/radiology tests were discussed with the patient and/or family. All questions were answered and concerns addressed.    Patient really didn't want to stay and I told him that if we can make some medication adjustments that we may be able to let him go.  He says that he is in and out of the afib all the time, but his rate is usually ok.  I did offer admission for this. His INR was low, gave lovenox here and bumped his coumadin and stopped the amlodipine, cut the lisinopril in half and started the atenolol and prevacid.    Procedures     None    EKG   I have  interpreted the EKG at the time it was performed and my official reading is as follows:    Last EKG Result     Procedure Component Value Units Date/Time    ECG 12 lead (Stat) [[id number]] Collected:  03/23/15  0925     Patient Age 51 years Updated:  03/23/15 1442     Patient DOB 01-15-1946      Patient Height --      Patient Weight --      Interpretation Text --      Result:        Atrial fibrillation rate of 118  Normal axis  Prolonged QT interval  Borderline intraventricular conduction delay  Abnormal R-wave progression, late transition  Borderline repolarization abnormality  No previous ECG available for comparison    Electronically Signed On 03-23-2015 14:42:24 EST by [facility] Kayela Humphres       Physician Interpreter Thomas Mabry      Ventricular Rate 118 //min      QRS Duration 113 ms      P-R Interval -- ms      Q-T Interval 360 ms      Q-T Interval(Corrected) 505 ms      P Wave Axis -- deg      QRS Axis 80 deg      T Axis -10 years             Diagnosis / Disposition     Clinical Impression  1. Atrial fibrillation with RVR    2. Subtherapeutic international normalized ratio (INR)    3. Epigastric abdominal pain        Disposition  ED Disposition     Discharge NONE- ENTERED BY MISTAKE          Prescriptions  Discharge Medication List as of 03/23/2015  2:19 PM      START taking these medications    Details   atenolol (TENORMIN) 25 MG tablet Take 1 tablet (25 mg total) by mouth 2 (two) times daily., Starting 03/23/2015, Until Discontinued, Print             Attestations     The documentation recorded by my scribe, Laverle Hobby, accurately reflects the services I personally performed and the decisions made by me.  Hall Busing Mitsuo Budnick, MD    Ishaq Maffei, Hall Busing, MD  03/23/15 [phone]

## 2015-04-03 ENCOUNTER — Emergency Department
Admission: EM | Admit: 2015-04-03 | Discharge: 2015-04-03 | Disposition: A | Discharge status: Home / Self Care | Payer: Medicare PPO | Attending: Emergency Medicine | Admitting: Emergency Medicine

## 2015-04-03 ENCOUNTER — Emergency Department: Payer: Medicare PPO

## 2015-04-03 DIAGNOSIS — S01511A Laceration without foreign body of lip, initial encounter: Secondary | ICD-10-CM | POA: Insufficient documentation

## 2015-04-03 DIAGNOSIS — S0083XA Contusion of other part of head, initial encounter: Secondary | ICD-10-CM | POA: Insufficient documentation

## 2015-04-03 DIAGNOSIS — S022XXA Fracture of nasal bones, initial encounter for closed fracture: Secondary | ICD-10-CM | POA: Insufficient documentation

## 2015-04-03 MED ORDER — TETANUS-DIPHTHERIA TOXOIDS TD 5-2 LFU IM INJ
INJECTION | INTRAMUSCULAR | Status: AC
Start: 2015-04-03 — End: ?
  Filled 2015-04-03: qty 0.5

## 2015-04-03 MED ORDER — LIDOCAINE HCL (PF) 1 % IJ SOLN
INTRAMUSCULAR | Status: AC
Start: 2015-04-03 — End: ?
  Filled 2015-04-03: qty 5

## 2015-04-03 MED ORDER — HYDROCODONE-ACETAMINOPHEN 5-325 MG PO TABS
1.0000 | ORAL_TABLET | Freq: Three times a day (TID) | ORAL | Status: DC | PRN
Start: 2015-04-03 — End: 2016-02-12

## 2015-04-03 MED ORDER — TETANUS-DIPHTHERIA TOXOIDS TD 5-2 LFU IM INJ
0.5000 mL | INJECTION | Freq: Once | INTRAMUSCULAR | Status: AC
Start: 2015-04-03 — End: 2015-04-03
  Administered 2015-04-03: 0.5 mL via INTRAMUSCULAR

## 2015-04-03 NOTE — ED Notes (Signed)
See quick triage note.
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
## 2015-04-10 ENCOUNTER — Emergency Department: Payer: Medicare PPO

## 2015-04-10 ENCOUNTER — Emergency Department
Admission: EM | Admit: 2015-04-10 | Discharge: 2015-04-10 | Disposition: A | Discharge status: Home / Self Care | Payer: Medicare PPO | Attending: Emergency Medicine | Admitting: Emergency Medicine

## 2015-04-10 DIAGNOSIS — Z4802 Encounter for removal of sutures: Secondary | ICD-10-CM

## 2015-04-10 NOTE — Discharge Instructions (Signed)
Suture Removal(No Complication)  You were seen today for a suture removal. Your wound is healing as expected. It is unlikely that you will have any further problem.  Home Care:   Keep the wound clean and dry. Use a Band-Aid, if needed, to keep the wound from getting dirty for the next week.   Wash the wound carefully with soap and water each day during the next week.   You may shower and bathe as usual. Swimming is now permitted.  Follow Up  for any problems with your own doctor.  Get Prompt Medical Attention  if any of the following occur:   Increasing pain in the wound   Redness, swelling or pus coming from the wound   Fever of 100.4F (38C) or higher, or as directed by your healthcare provider   If the wound edges re-open   2000-2015 [employer] [address], [place] [address]. All rights reserved. This information is not intended as a substitute for professional medical care. Always follow your healthcare professional's instructions.
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
## 2015-04-10 NOTE — ED Notes (Signed)
Four sutures removed from patients upper lip without trouble.

## 2015-04-10 NOTE — ED Notes (Signed)
Pts bp continues to be elevated, MD aware. No new orders.

## 2015-07-31 ENCOUNTER — Other Ambulatory Visit (INDEPENDENT_AMBULATORY_CARE_PROVIDER_SITE_OTHER): Payer: Self-pay | Admitting: Internal Medicine

## 2015-07-31 DIAGNOSIS — J40 Bronchitis, not specified as acute or chronic: Secondary | ICD-10-CM

## 2016-02-12 ENCOUNTER — Emergency Department: Payer: Medicare PPO

## 2016-02-12 ENCOUNTER — Emergency Department
Admission: EM | Admit: 2016-02-12 | Discharge: 2016-02-12 | Disposition: A | Discharge status: Home / Self Care | Payer: Medicare PPO | Attending: Emergency Medicine | Admitting: Emergency Medicine

## 2016-02-12 DIAGNOSIS — R1032 Left lower quadrant pain: Secondary | ICD-10-CM

## 2016-02-12 DIAGNOSIS — R109 Unspecified abdominal pain: Secondary | ICD-10-CM

## 2016-02-12 LAB — I-STAT CHEM 8 CARTRIDGE
Anion Gap I-Stat: 14 (ref 7–16)
BUN I-Stat: 25 mg/dL — ABNORMAL HIGH (ref 6–20)
Calcium Ionized I-Stat: 4.3 mg/dL — ABNORMAL LOW (ref 4.35–5.10)
Chloride I-Stat: 99 mMol/L (ref 98–112)
Creatinine I-Stat: 1.2 mg/dL (ref 0.90–1.30)
EGFR: 61 mL/min/{1.73_m2} (ref 60–150)
Glucose I-Stat: 145 mg/dL — ABNORMAL HIGH (ref 70–99)
Hematocrit I-Stat: 58 % — ABNORMAL HIGH (ref 39.0–52.5)
Hemoglobin I-Stat: 19.7 gm/dL — ABNORMAL HIGH (ref 13.0–17.5)
Potassium I-Stat: 4.5 mMol/L (ref 3.5–5.3)
Sodium I-Stat: 138 mMol/L (ref 135–145)
TCO2 I-Stat: 30 mMol/L — ABNORMAL HIGH (ref 24–29)

## 2016-02-12 LAB — ECG 12-LEAD
Patient Age: 69 years
Q-T Interval(Corrected): 443 ms
Q-T Interval: 358 ms
QRS Axis: 70 deg
QRS Duration: 103 ms
T Axis: 91 years
Ventricular Rate: 92 //min

## 2016-02-12 LAB — CBC AND DIFFERENTIAL
Basophils %: 0.2 % (ref 0.0–3.0)
Basophils Absolute: 0 10*3/uL (ref 0.0–0.3)
Eosinophils %: 0.7 % (ref 0.0–7.0)
Eosinophils Absolute: 0.1 10*3/uL (ref 0.0–0.8)
Hematocrit: 57.5 % — ABNORMAL HIGH (ref 39.0–52.5)
Hemoglobin: 18.8 gm/dL — ABNORMAL HIGH (ref 13.0–17.5)
Lymphocytes Absolute: 1.1 10*3/uL (ref 0.6–5.1)
Lymphocytes: 6.2 % — ABNORMAL LOW (ref 15.0–46.0)
MCH: 31 pg (ref 28–35)
MCHC: 33 gm/dL (ref 32–36)
MCV: 94 fL (ref 80–100)
MPV: 7.6 fL (ref 6.0–10.0)
Monocytes Absolute: 0.8 10*3/uL (ref 0.1–1.7)
Monocytes: 4.7 % (ref 3.0–15.0)
Neutrophils %: 88.2 % — ABNORMAL HIGH (ref 42.0–78.0)
Neutrophils Absolute: 15.8 10*3/uL — ABNORMAL HIGH (ref 1.7–8.6)
PLT CT: 288 10*3/uL (ref 130–440)
RBC: 6.13 10*6/uL — ABNORMAL HIGH (ref 4.00–5.70)
RDW: 13.3 % (ref 11.0–14.0)
WBC: 17.9 10*3/uL — ABNORMAL HIGH (ref 4.0–11.0)

## 2016-02-12 LAB — VH URINALYSIS WITH MICROSCOPIC
Bilirubin, UA: NEGATIVE
Blood, UA: NEGATIVE
Glucose, UA: 50 mg/dL — AB
Ketones UA: 5 mg/dL
Leukocyte Esterase, UA: NEGATIVE Leu/uL
Nitrite, UA: NEGATIVE
Protein, UR: >=500 mg/dL — AB
RBC, UA: 3 /hpf (ref 0–4)
Urine Specific Gravity: 1.017 (ref 1.001–1.040)
Urobilinogen, UA: NORMAL mg/dL
WBC, UA: 1 /hpf (ref 0–4)
pH, Urine: 6 pH (ref 5.0–8.0)

## 2016-02-12 LAB — VH I-STAT INR: i-STAT INR: 1.1 (ref 0.0–3.0)

## 2016-02-12 LAB — VH I-STAT CHEM 8 NOTIFICATION

## 2016-02-12 LAB — ETHANOL: Alcohol: <10 mg/dL (ref 0–9)

## 2016-02-12 LAB — VH I-STAT INR NOTIFICATION

## 2016-02-12 MED ORDER — ONDANSETRON HCL 4 MG/2ML IJ SOLN
INTRAMUSCULAR | Status: AC
Start: 2016-02-12 — End: ?
  Filled 2016-02-12: qty 2

## 2016-02-12 MED ORDER — OXYCODONE-ACETAMINOPHEN 5-325 MG PO TABS
1.0000 | ORAL_TABLET | ORAL | Status: DC | PRN
Start: 2016-02-12 — End: 2016-02-13
  Administered 2016-02-12: 1 via ORAL

## 2016-02-12 MED ORDER — KETOROLAC TROMETHAMINE 30 MG/ML IJ SOLN
30.0000 mg | Freq: Once | INTRAMUSCULAR | Status: AC
Start: 2016-02-12 — End: 2016-02-12
  Administered 2016-02-12: 30 mg via INTRAVENOUS

## 2016-02-12 MED ORDER — OXYCODONE-ACETAMINOPHEN 5-325 MG PO TABS
ORAL_TABLET | ORAL | Status: AC
Start: 2016-02-12 — End: ?
  Filled 2016-02-12: qty 1

## 2016-02-12 MED ORDER — OXYCODONE-ACETAMINOPHEN 5-325 MG PO TABS
1.0000 | ORAL_TABLET | Freq: Four times a day (QID) | ORAL | 0 refills | Status: AC | PRN
Start: 2016-02-12 — End: 2016-02-15

## 2016-02-12 MED ORDER — KETOROLAC TROMETHAMINE 30 MG/ML IJ SOLN
INTRAMUSCULAR | Status: AC
Start: 2016-02-12 — End: ?
  Filled 2016-02-12: qty 1

## 2016-02-12 NOTE — ED Notes (Signed)
Pt stated that he has had intermittent abdominal pain x 1 year. Pt stated that he had CT, U/S, and x-rays completed in past for the abdominal pain. Pt stated that these tests have not found anything to his knowledge.     Pt came in this evening d/t the abdominal pain-left lower abdominal area which started this morning around 9AM. The pain has gotten worse throughout the day. + nausea. Denies vomiting/diarrhea/fever.

## 2016-02-12 NOTE — Special Discharge Instructions (Signed)
Discuss with your regular primary care provider that you may need further evaluation to include an MRI of abdomen or vascular studies

## 2016-02-12 NOTE — ED Provider Notes (Signed)
ED DOC NOTE     History provided by the patient    History is limited by none    HPI     70 y.o. male presents with left lower abdominal pain. Pt notes that he has been having abdominal pains today with nausea. Pt states that the pain is coming in waves and in his left lower abdomen. Pt notes that he is a pack a day smoker and a daily drinker. He has been having this pain intermittently now for three years and has had several evaluations for this pain and has not had a diagnosis. Pain is not radiating.    Associated symptoms are abdominal pain and nausea.    Review of Systems   Review of Systems   Constitutional: Negative for appetite change, chills and fever.   HENT: Negative for congestion, rhinorrhea and sore throat.    Eyes: Negative for pain, discharge and redness.   Respiratory: Negative for cough and choking.    Gastrointestinal: Positive for abdominal pain and nausea. Negative for diarrhea and vomiting.   Genitourinary: Negative for difficulty urinating and hematuria.   Musculoskeletal: Negative for gait problem and neck stiffness.   Skin: Negative for pallor and rash.   Neurological: Negative for dizziness, seizures and weakness.   Psychiatric/Behavioral: Negative for behavioral problems and suicidal ideas.   All other systems reviewed and are negative.      [x]  Except as marked above as positive, the remainder of the systems above were reviewed and found negative.      PAST MEDICAL/SURGICAL HISTORY:  Past Medical History:   Diagnosis Date   . Atrial fibrillation    . Hypertension      Past Surgical History:   Procedure Laterality Date   . APPENDECTOMY     . COLONOSCOPY, POLYPECTOMY  12/16/2012    Procedure: COLONOSCOPY, POLYPECTOMY;  Surgeon: Gwenith Spitz, MD;  Location: [facility];  Service: Gastroenterology;  Laterality: N/A;   . EGD, BIOPSY  12/16/2012    Procedure: EGD, BIOPSY;  Surgeon: Gwenith Spitz, MD;  Location: [facility];  Service: Gastroenterology;  Laterality: N/A;   . KNEE  ARTHROSCOPY W/ ACL RECONSTRUCTION     . LIFT, ARM (MEDICAL)       Additional Past Medical/Surgical History:       SOCIAL AND FAMILY HISTORY:  Social History   Substance Use Topics   . Smoking status: Current Every Day Smoker     Packs/day: 0.50     Types: Cigarettes   . Smokeless tobacco: Never Used   . Alcohol use Yes      Comment: occasional     History reviewed. No pertinent family history.     Additional Social and Family History:  Additional SH: [x]   I reviewed SH above       Additional FH: [x]  I reviewed FH above    Allergies   Allergen Reactions   . Codeine      Additional Allergies:    Prior to Admission medications    Medication Sig Start Date End Date Taking? Authorizing Provider   amLODIPine (NORVASC) 5 MG tablet Take 5 mg by mouth daily.    10/07/12   [provider]   atenolol (TENORMIN) 25 MG tablet Take 1 tablet (25 mg total) by mouth 2 (two) times daily. 03/23/15   Call, Hall Busing, MD   HYDROcodone-acetaminophen (NORCO) 5-325 MG per tablet Take 1 tablet by mouth every 8 (eight) hours as needed for Pain. 04/03/15   Cyndie Chime,  Lucia Gaskins, [place]   lansoprazole (PREVACID) 30 MG capsule Take 1 capsule (30 mg total) by mouth daily. 03/23/15   Call, Hall Busing, MD   lisinopril (PRINIVIL,ZESTRIL) 40 MG tablet Take 0.5 tablets (20 mg total) by mouth daily. 03/23/15   Call, Hall Busing, MD   omeprazole (PRILOSEC) 40 MG capsule Take 40 mg by mouth daily.    02/02/14   [provider]   warfarin (COUMADIN) 5 MG tablet Monday AND Wednesday and Saturday pt takes 7.5mg  (1.5 tablets) 03/23/15   Call, Hall Busing, MD       PHYSICAL EXAM   Vitals:    02/12/16 2257   BP: (!) 180/100   Pulse: 97   Resp: 18   Temp: 98.9 F (37.2 C)   SpO2: 99%     [x]  Nursing note reviewed [x]  Vitals reviewed   Pulse Oximetry on Room Air Normal  Physical Exam   Constitutional: He is oriented to person, place, and time. He appears well-developed and well-nourished. He appears distressed.   HENT:   Head: Normocephalic and atraumatic.    Eyes: Conjunctivae are normal. Pupils are equal, round, and reactive to light.   Neck: Normal range of motion.   Cardiovascular: Normal heart sounds and intact distal pulses.  Exam reveals no gallop and no friction rub.    No murmur heard.  Pulmonary/Chest: Effort normal and breath sounds normal.   Abdominal: Soft. He exhibits no distension. There is no tenderness. There is CVA tenderness (left).   Musculoskeletal: Normal range of motion. He exhibits no edema or tenderness.   Neurological: He is alert and oriented to person, place, and time. He displays normal reflexes. No cranial nerve deficit. He exhibits normal muscle tone.   Skin: Skin is warm and dry. No rash noted. He is not diaphoretic. No erythema. No pallor.   Psychiatric: He has a normal mood and affect. His behavior is normal. Thought content normal.   Nursing note and vitals reviewed.      LABS/TESTS:  Results     Procedure Component Value Units Date/Time    Urinalysis with Microscopic [[id number]]  (Abnormal) Collected:  02/12/16 2040    Specimen:  Urine, Random Updated:  02/12/16 2058     Color, UA Yellow     Clarity, UA Clear     Specific Gravity, UR 1.017     pH, Urine 6.0 pH      Protein, UR >=500 (A) mg/dL      Glucose, UA 50 (A) mg/dL      Ketones UA 5 mg/dL      Bilirubin, UA Negative     Blood, UA Negative     Nitrite, UA Negative     Urobilinogen, UA Normal mg/dL      Leukocyte Esterase, UA Negative Leu/uL      UR Micro Performed     WBC, UA 1 /hpf      RBC, UA 3 /hpf      Hyaline Casts, UA 0-2 (A) /lpf     Alcohol Level [[id number]] Collected:  02/12/16 1915    Specimen:  Plasma Updated:  02/12/16 1946     Alcohol <10 mg/dL     i-Stat Chem 8 CartrIDge [[id number]]  (Abnormal) Collected:  02/12/16 1928    Specimen:  Blood Updated:  02/12/16 1931     i-STAT Sodium 138 mMol/L      i-STAT Potassium 4.5 mMol/L      i-STAT Chloride 99 mMol/L      TCO2,  ISTAT 30 (H) mMol/L      Ionized Ca, ISTAT 4.30 (L) mg/dL      i-STAT Glucose 742 (H) mg/dL       i-STAT Creatinine 1.20 mg/dL      i-STAT BUN 25 (H) mg/dL      Anion Gap, ISTAT 59.5     EGFR 61 mL/min/1.32m2      i-STAT Hematocrit 58.0 (H) %      i-STAT Hemoglobin 19.7 (H) gm/dL     CBC [[id number]]  (Abnormal) Collected:  02/12/16 1915    Specimen:  Blood from Blood Updated:  02/12/16 1929     WBC 17.9 (H) K/cmm      RBC 6.13 (H) M/cmm      Hemoglobin 18.8 (H) gm/dL      Hematocrit 29.5 (H) %      MCV 94 fL      MCH 31 pg      MCHC 33 gm/dL      RDW 18.8 %      PLT CT 288 K/cmm      MPV 7.6 fL      NEUTROPHIL % 88.2 (H) %      Lymphocytes 6.2 (L) %      Monocytes 4.7 %      Eosinophils % 0.7 %      Basophils % 0.2 %      Neutrophils Absolute 15.8 (H) K/cmm      Lymphocytes Absolute 1.1 K/cmm      Monocytes Absolute 0.8 K/cmm      Eosinophils Absolute 0.1 K/cmm      BASO Absolute 0.0 K/cmm     I-Stat INR [[id number]] Collected:  02/12/16 1925    Specimen:  Blood Updated:  02/12/16 1928     i-STAT INR 1.1    I-Stat Chem 8 [[id number]] Collected:  02/12/16 1918    Specimen:  ISTAT Updated:  02/12/16 1924     I-STAT Notification Istat Notification    I-Stat INR [[id number]] Collected:  02/12/16 1918    Specimen:  ISTAT Updated:  02/12/16 1924     I-STAT Notification Istat Notification            Ct Abdomen Pelvis Dry  (stone)    Result Date: 02/12/2016  No acute changes identified within the abdomen and pelvis. Moderate enlargement prostate. Generalized moderate calcific atherosclerotic changes to the vasculature. ReadingStation:[id number]            EKG:   I have interpreted the EKG at the time it was performed and my official reading is as follows:  Last EKG Result     Procedure Component Value Units Date/Time    ECG 12 lead (Stat) (Cardiac Related) [[id number]] Collected:  02/12/16 1912     Updated:  02/12/16 1921     Patient Age 84 years      Patient DOB 08-10-1945     Patient Height --     Patient Weight --     Interpretation Text --     Atrial fibrillation  LVH      Electronically Signed On 02-12-2016 19:21:18 EDT  by Elwin Mocha       Physician Interpreter [facility]     Ventricular Rate 92 //min      QRS Duration 103 ms      P-R Interval -- ms      Q-T Interval 358 ms      Q-T Interval(Corrected) 443 ms      P Wave Axis --  deg      QRS Axis 70 deg      T Axis 91 years             ED COURSE:  BP (!) 180/100 Comment: pt to tkae BP med when he goes home-Dr. Nechama Guard aware and told pt to take home medications  Pulse 97   Temp 98.9 F (37.2 C) (Oral)   Resp 18   Ht 1.829 m   Wt 92.1 kg   SpO2 99%   BMI 27.53 kg/m   ED Course as of Feb 12 2308   Mon Feb 12, 2016   2153 Protein, UR: (!) >=500 [SB]   2154 Glucose, UA: (!) 50 [SB]   2154 WBC: (!) 17.9 [SB]      ED Course User Index  [SB] Harriette Bouillon, MD         CONSULTATIONS:  None      PROCEDURES:  None      IMPRESSION:     70 y.o. male with  1. Abdominal pain in male        DIFFERENTIAL DIAGNOSIS:  ACUTE CHOLECYSTITIS, CHOLANGITIS,GASTRITIS, PANCREATITIS, PEPTIC ULCER DISEASE, SMALL BOWEL OBSTRUCTION, PERFORATED VISCOUS, INTUSSUSCEPTION, APPENDICITIS, DIVERTICULAR DISEASE, CARCINOMA, COLITIS      MEDICAL DECISION MAKING:  I I have evaluated all labs and imaging studies in the scope of a single Emergency Department visit and have determined that at this time an acute life threatening illness  does not exist. I have explained all results to the patient and all questions have been answered.  The patient have been given strict return instructions to return to the ER for any worsening or changing symptoms and that they should follow up with a primary care provider. The patient was discharge home in stable condition.      PLAN:   ED Disposition     ED Disposition Condition Date/Time Comment    Discharge  Mon Feb 12, 2016 10:05 PM [name] discharge to home/self care.    Condition at disposition: Stable        Discharge Medication List as of 02/12/2016 10:29 PM      START taking these medications    Details   oxyCODONE-acetaminophen (PERCOCET) 5-325 MG per  tablet Take 1 tablet by mouth every 6 (six) hours as needed for Pain.for up to 3 days, Starting Mon 02/12/2016, Until Thu 02/15/2016, Print               Scribe Attestation statement    I, Harriette Bouillon, personally performed the services documented. Michaelene Song is scribing for me on [employer].. I reviewed and confirm the accuracy of the information in this medical record.      Harriette Bouillon, MD  02/12/16 [id number]

## 2016-02-12 NOTE — ED Notes (Signed)
Dr. Nechama Guard made aware of pt's BP. No new orders at this time.     Pt stated that he has not taken any of his HTN medication's today. Pt denies headache, dizziness, nausea, CP, or SOB at this time.     Pt and family updated on POC, no questions or concerns at this time.

## 2016-02-22 ENCOUNTER — Ambulatory Visit
Admission: RE | Admit: 2016-02-22 | Discharge: 2016-02-22 | Disposition: A | Discharge status: Home / Self Care | Payer: Medicare PPO | Source: Ambulatory Visit | Attending: Internal Medicine | Admitting: Internal Medicine

## 2016-02-22 ENCOUNTER — Encounter: Payer: Self-pay | Admitting: Internal Medicine

## 2016-02-22 DIAGNOSIS — R05 Cough: Secondary | ICD-10-CM | POA: Insufficient documentation

## 2016-02-22 DIAGNOSIS — I517 Cardiomegaly: Secondary | ICD-10-CM | POA: Insufficient documentation

## 2016-02-22 DIAGNOSIS — R0602 Shortness of breath: Secondary | ICD-10-CM | POA: Insufficient documentation

## 2016-02-22 DIAGNOSIS — R059 Cough, unspecified: Secondary | ICD-10-CM

## 2016-03-05 ENCOUNTER — Other Ambulatory Visit (HOSPITAL_COMMUNITY): Payer: Self-pay | Admitting: INTERNAL MEDICINE

## 2016-03-05 DIAGNOSIS — I739 Peripheral vascular disease, unspecified: Secondary | ICD-10-CM

## 2017-04-13 ENCOUNTER — Emergency Department: Payer: Medicare PPO

## 2017-04-13 ENCOUNTER — Inpatient Hospital Stay
Admission: EM | Admit: 2017-04-13 | Discharge: 2017-04-15 | DRG: 291 | Disposition: A | Discharge status: Home / Self Care | Payer: Medicare PPO | Attending: Internal Medicine | Admitting: Internal Medicine

## 2017-04-13 DIAGNOSIS — I4891 Unspecified atrial fibrillation: Secondary | ICD-10-CM | POA: Diagnosis present

## 2017-04-13 DIAGNOSIS — Z885 Allergy status to narcotic agent status: Secondary | ICD-10-CM

## 2017-04-13 DIAGNOSIS — Z91128 Patient's intentional underdosing of medication regimen for other reason: Secondary | ICD-10-CM

## 2017-04-13 DIAGNOSIS — F17210 Nicotine dependence, cigarettes, uncomplicated: Secondary | ICD-10-CM | POA: Diagnosis present

## 2017-04-13 DIAGNOSIS — I130 Hypertensive heart and chronic kidney disease with heart failure and stage 1 through stage 4 chronic kidney disease, or unspecified chronic kidney disease: Principal | ICD-10-CM | POA: Diagnosis present

## 2017-04-13 DIAGNOSIS — M1990 Unspecified osteoarthritis, unspecified site: Secondary | ICD-10-CM | POA: Diagnosis present

## 2017-04-13 DIAGNOSIS — R0602 Shortness of breath: Secondary | ICD-10-CM

## 2017-04-13 DIAGNOSIS — Z7901 Long term (current) use of anticoagulants: Secondary | ICD-10-CM

## 2017-04-13 DIAGNOSIS — T45516A Underdosing of anticoagulants, initial encounter: Secondary | ICD-10-CM | POA: Diagnosis present

## 2017-04-13 DIAGNOSIS — N179 Acute kidney failure, unspecified: Secondary | ICD-10-CM | POA: Diagnosis present

## 2017-04-13 DIAGNOSIS — I110 Hypertensive heart disease with heart failure: Secondary | ICD-10-CM

## 2017-04-13 DIAGNOSIS — I513 Intracardiac thrombosis, not elsewhere classified: Secondary | ICD-10-CM | POA: Diagnosis present

## 2017-04-13 DIAGNOSIS — R05 Cough: Secondary | ICD-10-CM

## 2017-04-13 DIAGNOSIS — R062 Wheezing: Secondary | ICD-10-CM

## 2017-04-13 DIAGNOSIS — R002 Palpitations: Secondary | ICD-10-CM

## 2017-04-13 DIAGNOSIS — I160 Hypertensive urgency: Secondary | ICD-10-CM | POA: Diagnosis present

## 2017-04-13 DIAGNOSIS — I480 Paroxysmal atrial fibrillation: Secondary | ICD-10-CM | POA: Diagnosis present

## 2017-04-13 DIAGNOSIS — Y92009 Unspecified place in unspecified non-institutional (private) residence as the place of occurrence of the external cause: Secondary | ICD-10-CM

## 2017-04-13 DIAGNOSIS — K219 Gastro-esophageal reflux disease without esophagitis: Secondary | ICD-10-CM | POA: Diagnosis present

## 2017-04-13 DIAGNOSIS — N189 Chronic kidney disease, unspecified: Secondary | ICD-10-CM | POA: Diagnosis present

## 2017-04-13 DIAGNOSIS — R791 Abnormal coagulation profile: Secondary | ICD-10-CM | POA: Diagnosis present

## 2017-04-13 DIAGNOSIS — I481 Persistent atrial fibrillation: Secondary | ICD-10-CM | POA: Diagnosis present

## 2017-04-13 DIAGNOSIS — I509 Heart failure, unspecified: Secondary | ICD-10-CM

## 2017-04-13 DIAGNOSIS — I5023 Acute on chronic systolic (congestive) heart failure: Secondary | ICD-10-CM | POA: Diagnosis present

## 2017-04-13 DIAGNOSIS — J449 Chronic obstructive pulmonary disease, unspecified: Secondary | ICD-10-CM | POA: Diagnosis present

## 2017-04-13 DIAGNOSIS — Z9049 Acquired absence of other specified parts of digestive tract: Secondary | ICD-10-CM

## 2017-04-13 HISTORY — DX: Intracardiac thrombosis, not elsewhere classified: I51.3

## 2017-04-13 LAB — CBC AND DIFFERENTIAL
Basophils %: 0.5 % (ref 0.0–3.0)
Basophils %: 0.3 % (ref 0.0–3.0)
Basophils Absolute: 0.1 10*3/uL (ref 0.0–0.3)
Basophils Absolute: 0 10*3/uL (ref 0.0–0.3)
Eosinophils %: 1.8 % (ref 0.0–7.0)
Eosinophils %: 1 % (ref 0.0–7.0)
Eosinophils Absolute: 0.2 10*3/uL (ref 0.0–0.8)
Eosinophils Absolute: 0.1 10*3/uL (ref 0.0–0.8)
Hematocrit: 45 % (ref 39.0–52.5)
Hematocrit: 44.9 % (ref 39.0–52.5)
Hemoglobin: 14.7 gm/dL (ref 13.0–17.5)
Hemoglobin: 15.3 gm/dL (ref 13.0–17.5)
Lymphocytes Absolute: 1.7 10*3/uL (ref 0.6–5.1)
Lymphocytes Absolute: 1.6 10*3/uL (ref 0.6–5.1)
Lymphocytes: 12.7 % — ABNORMAL LOW (ref 15.0–46.0)
Lymphocytes: 12.4 % — ABNORMAL LOW (ref 15.0–46.0)
MCH: 31 pg (ref 28–35)
MCH: 32 pg (ref 28–35)
MCHC: 33 gm/dL (ref 32–36)
MCHC: 34 gm/dL (ref 32–36)
MCV: 95 fL (ref 80–100)
MCV: 95 fL (ref 80–100)
MPV: 7.4 fL (ref 6.0–10.0)
MPV: 8 fL (ref 6.0–10.0)
Monocytes Absolute: 0.8 10*3/uL (ref 0.1–1.7)
Monocytes Absolute: 0.7 10*3/uL (ref 0.1–1.7)
Monocytes: 5.9 % (ref 3.0–15.0)
Monocytes: 5.1 % (ref 3.0–15.0)
Neutrophils %: 79.1 % — ABNORMAL HIGH (ref 42.0–78.0)
Neutrophils %: 81.2 % — ABNORMAL HIGH (ref 42.0–78.0)
Neutrophils Absolute: 10.3 10*3/uL — ABNORMAL HIGH (ref 1.7–8.6)
Neutrophils Absolute: 10.7 10*3/uL — ABNORMAL HIGH (ref 1.7–8.6)
PLT CT: 248 10*3/uL (ref 130–440)
PLT CT: 282 10*3/uL (ref 130–440)
RBC: 4.73 10*6/uL (ref 4.00–5.70)
RBC: 4.75 10*6/uL (ref 4.00–5.70)
RDW: 13.3 % (ref 11.0–14.0)
RDW: 13.2 % (ref 11.0–14.0)
WBC: 13 10*3/uL — ABNORMAL HIGH (ref 4.0–11.0)
WBC: 13.2 10*3/uL — ABNORMAL HIGH (ref 4.0–11.0)

## 2017-04-13 LAB — BASIC METABOLIC PANEL
Anion Gap: 12.8 mMol/L (ref 7.0–18.0)
Anion Gap: 12.9 mMol/L (ref 7.0–18.0)
Anion Gap: 14.9 mMol/L (ref 7.0–18.0)
BUN / Creatinine Ratio: 10.7 Ratio (ref 10.0–30.0)
BUN / Creatinine Ratio: 11 Ratio (ref 10.0–30.0)
BUN / Creatinine Ratio: 13.8 Ratio (ref 10.0–30.0)
BUN: 17 mg/dL (ref 7–22)
BUN: 16 mg/dL (ref 7–22)
BUN: 19 mg/dL (ref 7–22)
CO2: 21.7 mMol/L (ref 20.0–30.0)
CO2: 23 mMol/L (ref 20.0–30.0)
CO2: 21.2 mMol/L (ref 20.0–30.0)
Calcium: 9 mg/dL (ref 8.5–10.5)
Calcium: 9.2 mg/dL (ref 8.5–10.5)
Calcium: 9.3 mg/dL (ref 8.5–10.5)
Chloride: 108 mMol/L (ref 98–110)
Chloride: 107 mMol/L (ref 98–110)
Chloride: 106 mMol/L (ref 98–110)
Creatinine: 1.59 mg/dL — ABNORMAL HIGH (ref 0.80–1.30)
Creatinine: 1.45 mg/dL — ABNORMAL HIGH (ref 0.80–1.30)
Creatinine: 1.38 mg/dL — ABNORMAL HIGH (ref 0.80–1.30)
EGFR: 43 mL/min/{1.73_m2} — ABNORMAL LOW (ref 60–150)
EGFR: 48 mL/min/{1.73_m2} — ABNORMAL LOW (ref 60–150)
EGFR: 51 mL/min/{1.73_m2} — ABNORMAL LOW (ref 60–150)
Glucose: 133 mg/dL — ABNORMAL HIGH (ref 71–99)
Glucose: 131 mg/dL — ABNORMAL HIGH (ref 71–99)
Glucose: 95 mg/dL (ref 71–99)
Osmolality Calc: 279 mOsm/kg (ref 275–300)
Osmolality Calc: 279 mOsm/kg (ref 275–300)
Osmolality Calc: 278 mOsm/kg (ref 275–300)
Potassium: 4.5 mMol/L (ref 3.5–5.3)
Potassium: 4.9 mMol/L (ref 3.5–5.3)
Potassium: 4.1 mMol/L (ref 3.5–5.3)
Sodium: 138 mMol/L (ref 136–147)
Sodium: 138 mMol/L (ref 136–147)
Sodium: 138 mMol/L (ref 136–147)

## 2017-04-13 LAB — TROPONIN I
Troponin I: 0.04 ng/mL — ABNORMAL HIGH (ref 0.00–0.02)
Troponin I: 0.03 ng/mL — ABNORMAL HIGH (ref 0.00–0.02)
Troponin I: 0.03 ng/mL — ABNORMAL HIGH (ref 0.00–0.02)

## 2017-04-13 LAB — ECG 12-LEAD
Patient Age: 71 years
Q-T Interval(Corrected): 428 ms
Q-T Interval: 300 ms
QRS Axis: 89 deg
QRS Duration: 104 ms
T Axis: 93 years
Ventricular Rate: 122 //min

## 2017-04-13 LAB — CBC
Hematocrit: 44.1 % (ref 39.0–52.5)
Hemoglobin: 15.3 gm/dL (ref 13.0–17.5)
MCH: 32 pg (ref 28–35)
MCHC: 35 gm/dL (ref 32–36)
MCV: 93 fL (ref 80–100)
MPV: 7.7 fL (ref 6.0–10.0)
PLT CT: 268 10*3/uL (ref 130–440)
RBC: 4.73 10*6/uL (ref 4.00–5.70)
RDW: 13 % (ref 11.0–14.0)
WBC: 12.9 10*3/uL — ABNORMAL HIGH (ref 4.0–11.0)

## 2017-04-13 LAB — PHOSPHORUS: Phosphorus: 3 mg/dL (ref 2.3–4.7)

## 2017-04-13 LAB — HEPATIC FUNCTION PANEL
ALT: 62 U/L — ABNORMAL HIGH (ref 0–55)
AST (SGOT): 33 U/L (ref 10–42)
Albumin/Globulin Ratio: 1.12 Ratio (ref 0.70–1.50)
Albumin: 3.7 gm/dL (ref 3.5–5.0)
Alkaline Phosphatase: 102 U/L (ref 40–145)
Bilirubin Direct: 0.5 mg/dL — ABNORMAL HIGH (ref 0.0–0.3)
Bilirubin, Total: 1 mg/dL (ref 0.1–1.2)
Globulin: 3.3 gm/dL (ref 2.0–4.0)
Protein, Total: 7 gm/dL (ref 6.0–8.3)

## 2017-04-13 LAB — PT/INR
PT INR: 1.1 (ref 0.5–1.3)
PT: 10.8 s (ref 9.5–11.5)

## 2017-04-13 LAB — B-TYPE NATRIURETIC PEPTIDE: B-Natriuretic Peptide: 1219 pg/mL — ABNORMAL HIGH (ref 0.0–100.0)

## 2017-04-13 LAB — MAGNESIUM: Magnesium: 2.2 mg/dL (ref 1.6–2.6)

## 2017-04-13 MED ORDER — VH WARFARIN THERAPY PLACEHOLDER
1.00 | Status: DC
Start: 2017-04-13 — End: 2017-04-13

## 2017-04-13 MED ORDER — METOPROLOL SUCCINATE ER 25 MG PO TB24
25.0000 mg | ORAL_TABLET | Freq: Every day | ORAL | Status: DC
Start: 2017-04-13 — End: 2017-04-13
  Administered 2017-04-13: 25 mg via ORAL
  Filled 2017-04-13: qty 1

## 2017-04-13 MED ORDER — METOPROLOL TARTRATE 5 MG/5ML IV SOLN
INTRAVENOUS | Status: AC
Start: 2017-04-13 — End: ?
  Filled 2017-04-13: qty 5

## 2017-04-13 MED ORDER — GUAIFENESIN 100 MG/5ML PO SOLN
200.00 mg | ORAL | Status: DC | PRN
Start: 2017-04-13 — End: 2017-04-15
  Administered 2017-04-13: 200 mg via ORAL
  Filled 2017-04-13: qty 10

## 2017-04-13 MED ORDER — FUROSEMIDE 10 MG/ML IJ SOLN
40.00 mg | Freq: Two times a day (BID) | INTRAMUSCULAR | Status: DC
Start: 2017-04-13 — End: 2017-04-13
  Administered 2017-04-13: 04:00:00 40 mg via INTRAVENOUS
  Filled 2017-04-13 (×3): qty 4

## 2017-04-13 MED ORDER — HYDRALAZINE HCL 25 MG PO TABS
25.00 mg | ORAL_TABLET | Freq: Once | ORAL | Status: AC
Start: 2017-04-13 — End: 2017-04-13
  Administered 2017-04-13: 14:00:00 25 mg via ORAL
  Filled 2017-04-13: qty 1

## 2017-04-13 MED ORDER — POLYETHYLENE GLYCOL 3350 17 G PO PACK
17.00 g | PACK | Freq: Every day | ORAL | Status: DC | PRN
Start: 2017-04-13 — End: 2017-04-15

## 2017-04-13 MED ORDER — IPRATROPIUM BROMIDE 0.02 % IN SOLN
RESPIRATORY_TRACT | Status: AC
Start: 2017-04-13 — End: ?
  Filled 2017-04-13: qty 2.5

## 2017-04-13 MED ORDER — ONDANSETRON HCL 4 MG/2ML IJ SOLN
4.00 mg | Freq: Three times a day (TID) | INTRAMUSCULAR | Status: DC | PRN
Start: 2017-04-13 — End: 2017-04-15

## 2017-04-13 MED ORDER — ACETAMINOPHEN 325 MG PO TABS
650.00 mg | ORAL_TABLET | ORAL | Status: DC | PRN
Start: 2017-04-13 — End: 2017-04-15

## 2017-04-13 MED ORDER — METOPROLOL SUCCINATE ER 50 MG PO TB24
50.00 mg | ORAL_TABLET | Freq: Every day | ORAL | Status: DC
Start: 2017-04-14 — End: 2017-04-14
  Administered 2017-04-14: 10:00:00 50 mg via ORAL
  Filled 2017-04-13: qty 1

## 2017-04-13 MED ORDER — METOPROLOL TARTRATE 5 MG/5ML IV SOLN
5.0000 mg | INTRAVENOUS | Status: DC | PRN
Start: 2017-04-13 — End: 2017-04-15
  Administered 2017-04-13 (×4): 5 mg via INTRAVENOUS
  Filled 2017-04-13 (×2): qty 5

## 2017-04-13 MED ORDER — ONDANSETRON 4 MG PO TBDP
4.00 mg | ORAL_TABLET | Freq: Three times a day (TID) | ORAL | Status: DC | PRN
Start: 2017-04-13 — End: 2017-04-15

## 2017-04-13 MED ORDER — ACETAMINOPHEN 160 MG/5ML PO SOLN
650.00 mg | ORAL | Status: DC | PRN
Start: 2017-04-13 — End: 2017-04-15

## 2017-04-13 MED ORDER — AMLODIPINE BESYLATE 5 MG PO TABS
5.00 mg | ORAL_TABLET | Freq: Every day | ORAL | Status: DC
Start: 2017-04-13 — End: 2017-04-15
  Administered 2017-04-13 – 2017-04-14 (×2): 5 mg via ORAL
  Filled 2017-04-13 (×3): qty 1

## 2017-04-13 MED ORDER — RIVAROXABAN 15 MG PO TABS
15.00 mg | ORAL_TABLET | Freq: Every day | ORAL | Status: DC
Start: 2017-04-13 — End: 2017-04-14
  Administered 2017-04-13: 17:00:00 15 mg via ORAL
  Filled 2017-04-13 (×2): qty 1

## 2017-04-13 MED ORDER — METOPROLOL TARTRATE 5 MG/5ML IV SOLN
5.0000 mg | INTRAVENOUS | Status: DC | PRN
Start: 2017-04-13 — End: 2017-04-13

## 2017-04-13 MED ORDER — ENOXAPARIN SODIUM 100 MG/ML SC SOLN
1.00 mg/kg | Freq: Two times a day (BID) | SUBCUTANEOUS | Status: DC
Start: 2017-04-13 — End: 2017-04-13
  Administered 2017-04-13: 06:00:00 100 mg via SUBCUTANEOUS
  Filled 2017-04-13 (×3): qty 1

## 2017-04-13 MED ORDER — SENNOSIDES-DOCUSATE SODIUM 8.6-50 MG PO TABS
2.00 | ORAL_TABLET | Freq: Every evening | ORAL | Status: DC
Start: 2017-04-13 — End: 2017-04-15
  Administered 2017-04-14: 21:00:00 2 via ORAL
  Filled 2017-04-13 (×3): qty 2

## 2017-04-13 MED ORDER — WARFARIN SODIUM 5 MG PO TABS
5.0000 mg | ORAL_TABLET | Freq: Every day | ORAL | Status: DC
Start: 2017-04-13 — End: 2017-04-13
  Filled 2017-04-13: qty 1

## 2017-04-13 MED ORDER — PANTOPRAZOLE SODIUM 40 MG PO TBEC
40.0000 mg | DELAYED_RELEASE_TABLET | Freq: Every morning | ORAL | Status: DC
Start: 2017-04-13 — End: 2017-04-15
  Administered 2017-04-13 – 2017-04-15 (×3): 40 mg via ORAL
  Filled 2017-04-13 (×3): qty 1

## 2017-04-13 MED ORDER — HYDRALAZINE HCL 25 MG PO TABS
25.00 mg | ORAL_TABLET | Freq: Three times a day (TID) | ORAL | Status: DC | PRN
Start: 2017-04-13 — End: 2017-04-15
  Administered 2017-04-14 – 2017-04-15 (×2): 25 mg via ORAL
  Filled 2017-04-13 (×4): qty 1

## 2017-04-13 MED ORDER — FAMOTIDINE 20 MG PO TABS
20.0000 mg | ORAL_TABLET | Freq: Two times a day (BID) | ORAL | Status: DC
Start: 2017-04-13 — End: 2017-04-15
  Administered 2017-04-13 – 2017-04-15 (×5): 20 mg via ORAL
  Filled 2017-04-13 (×6): qty 1

## 2017-04-13 MED ORDER — ALBUTEROL SULFATE (2.5 MG/3ML) 0.083% IN NEBU
INHALATION_SOLUTION | RESPIRATORY_TRACT | Status: AC
Start: 2017-04-13 — End: ?
  Filled 2017-04-13: qty 3

## 2017-04-13 MED ORDER — VITAMIN D 1000 UNITS PO TABS
2000.0000 [IU] | ORAL_TABLET | Freq: Every day | ORAL | Status: DC
Start: 2017-04-13 — End: 2017-04-15
  Administered 2017-04-13 – 2017-04-15 (×3): 2000 [IU] via ORAL
  Filled 2017-04-13 (×3): qty 2

## 2017-04-13 MED ORDER — METOPROLOL TARTRATE 5 MG/5ML IV SOLN
5.0000 mg | Freq: Once | INTRAVENOUS | Status: AC
Start: 2017-04-13 — End: 2017-04-13
  Administered 2017-04-13: 5 mg via INTRAVENOUS

## 2017-04-13 MED ORDER — ZOLPIDEM TARTRATE 5 MG PO TABS
5.00 mg | ORAL_TABLET | Freq: Every evening | ORAL | Status: DC | PRN
Start: 2017-04-13 — End: 2017-04-15
  Administered 2017-04-13 – 2017-04-15 (×2): 5 mg via ORAL
  Filled 2017-04-13 (×3): qty 1

## 2017-04-13 MED ORDER — ACETAMINOPHEN 650 MG RE SUPP
650.00 mg | RECTAL | Status: DC | PRN
Start: 2017-04-13 — End: 2017-04-15

## 2017-04-13 MED ORDER — SODIUM CHLORIDE 0.9 % IJ SOLN
3.00 mL | Freq: Three times a day (TID) | INTRAMUSCULAR | Status: DC
Start: 2017-04-13 — End: 2017-04-15
  Administered 2017-04-13 – 2017-04-15 (×6): 3 mL via INTRAVENOUS

## 2017-04-13 MED ORDER — SODIUM CHLORIDE 0.9 % IJ SOLN
0.40 mg | INTRAMUSCULAR | Status: DC | PRN
Start: 2017-04-13 — End: 2017-04-15

## 2017-04-13 NOTE — Plan of Care (Signed)
Problem: Hemodynamic Status: Cardiac  Goal: Stable vital signs and fluid balance   04/13/17 0754   Goal/Interventions addressed this shift   Stable vital signs and fluid balance Monitor/assess vital signs and telemetry per unit protocol;Monitor lab values   IV Lasix and fluid restriction

## 2017-04-13 NOTE — ED Notes (Signed)
Notified Dr. Grace Isaac about pt's bp

## 2017-04-13 NOTE — ED Notes (Signed)
Dr. Watts at bedside.

## 2017-04-13 NOTE — Plan of Care (Addendum)
Problem: Compromised Hemodynamic Status  Goal: Vital signs and fluid balance maintained/improved  Outcome: Progressing    0400:  Pt arrived to Rm 366 from the ED.  Pt is alert and oriented x3.  Pt up ambulating in the room.  Pt oriented to the room and call bell.  Admission completed.  Tele placed.  White board updated.  Will continue to monitor.    0450:  Pt has a strong cough that causes him to get short of breath.  Spoke to Dr. Jean Rosenthal and per MD okay to order Robitussin 200mg  q4h PRN.         04/13/17 0432   Goal/Interventions addressed this shift   Vital signs and fluid balance are maintained/improved Position patient for maximum circulation/cardiac output;Monitor/assess vitals and hemodynamic parameters with position changes;Monitor and compare daily weight;Monitor intake and output. Notify LIP if urine output is less than 30 mL/hour.;Monitor/assess lab values and report abnormal values     Pt admitted with shortness of breath for a few days, BNP elevated.  Lasix ordered.

## 2017-04-13 NOTE — H&P (Signed)
HISTORY AND PHYSICAL - [facility]    Date Time: 04/13/17 2:35 AM  Patient Name: Christopher Dickerson, Christopher SR.  Attending Physician: Tori Milks, MD  Primary Care Physician: Gayla Medicus, MD    CC:   Chief Complaint   Patient presents with   . Shortness of Breath       Assessment/Plan                                                                              [facility]     Atrial fib with RVR  Metoprolol IV prn, may start cardizem if no improvement  Restart coumadin 5mg  pt has not been taking (possibly change to eliquis or xarelto if insurance pays?)  lovenox therapeutic  tele    Hypertensive urgency  bp 169/135   Resume  Metoprolol. Hold ace due to AKI  Hydralazine prn, metoprolol IV prn    Shortness of breath Likely 2/2 CHF due to RVR  Admitting to TELE floor.   Diuresing with Lasix.   Closely monitoring Is/Os, daily weights.   Follow troponin levels.   Volume and salt restricting.   Monitor chemistries.     AKI  Likely cardiorenal  Recheck in am after diuresing with lasix    Tobacco abuse  Will provide smoking cessation counseling.     GERD  Continuing PPI.     COPD  Supportive care        Nutrition: Cardiac Diet  GI Prophylaxis: continue PPI  DVT Prophylaxis: low molecular weight heparin, Coumadin with INR goal 2-3 and SCD's while in bed  Code Status: full code      Service status: INPATIENT   risk of morbidity and mortality, risk of progressive disease and risk of readmission    Anticipate discharge in:2 days.  History of Presenting Illness:                                                          [facility]     Christopher Stockard Sr. is a 71 y.o. male who afib, HTN, tobacco abuse presents to the hospital with shortness of breath.   This is been going on for a couple days. He hasn't been able to do a whole lot because of it. Hasn't been going to the hot doctor much because he doesn't feel like he is being listened to.  He denies chest pain, nausea vomiting diarrhea. He  denies recent fevers recent antibiotic use or medication changes. He does say that he changes on medication and stopped Coumadin because he saw a commercial diets that it was bad. We had a long discussion about putting him back on blood thinners.      Review Of Systems:  [facility]   1. Constitutonal:  Denies fever, weight loss or fatigue.  2. Eyes:  Denies blurry vision or double vision.  3. ENT:  Denies sore throat or trouble swallowing.  4. CV:  Denies chest pain or palpitations.  5. Respiratory:  Denies cough.  6. GI:  Denies nausea, vomiting, diarrhea, constipation or abdominal pain.  7. GU: Denies pain or burning with urination.  8. MS: Denies new muscle or joint pain.  9. Skin: Denies rash.  10. Neuro: Denies any new areas of numbness or weakness.  1. Psych: Denies being depressed or anxious.  12. Heme: Denies any bleeding or recent anemia.  13. Endocrine: Denies heat or cold intolerance. Denies excessive thirst or urination.  14. Immune:  Denies seasonal allergies or asthma exacerbation.    Past Medical History:                                                                        [facility]     Past Medical History:   Diagnosis Date   . Atrial fibrillation    . Chronic obstructive pulmonary disease    . Hypertension        Past Surgical History:                                                                       [facility]     Past Surgical History:   Procedure Laterality Date   . APPENDECTOMY     . COLONOSCOPY, POLYPECTOMY  12/16/2012    Procedure: COLONOSCOPY, POLYPECTOMY;  Surgeon: Gwenith Spitz, MD;  Location: [facility];  Service: Gastroenterology;  Laterality: N/A;   . EGD, BIOPSY  12/16/2012    Procedure: EGD, BIOPSY;  Surgeon: Gwenith Spitz, MD;  Location: [facility];  Service: Gastroenterology;  Laterality: N/A;   . KNEE ARTHROSCOPY W/ ACL RECONSTRUCTION     . LIFT, ARM (MEDICAL)          Family History:                                                                                    [facility]     History reviewed. No pertinent family history.    Social History:  [facility]     Social History     Social History   . Marital status: Married     Spouse name: N/A   . Number of children: N/A   . Years of education: N/A     Occupational History   . Not on file.     Social History Main Topics   . Smoking status: Current Every Day Smoker     Packs/day: 0.50     Types: Cigarettes   . Smokeless tobacco: Never Used   . Alcohol use Yes      Comment: occasional   . Drug use: No   . Sexual activity: Not on file     Other Topics Concern   . Not on file     Social History Narrative   . No narrative on file       Code Status: Prior  Allergies:                                                                                             [facility]     Allergies   Allergen Reactions   . Codeine        Medications:                                                                                       [facility]     No current facility-administered medications on file prior to encounter.      Current Outpatient Prescriptions on File Prior to Encounter   Medication Sig Dispense Refill   . lisinopril (PRINIVIL,ZESTRIL) 40 MG tablet Take 0.5 tablets (20 mg total) by mouth daily. (Patient taking differently: Take 40 mg by mouth daily.    ) 30 tablet 6   . metoprolol succinate XL (TOPROL-XL) 25 MG 24 hr tablet Take 25 mg by mouth daily.     [name] amLODIPine (NORVASC) 5 MG tablet Take 5 mg by mouth daily.        [name] atenolol (TENORMIN) 25 MG tablet Take 1 tablet (25 mg total) by mouth 2 (two) times daily. 60 tablet 6   . lansoprazole (PREVACID) 30 MG capsule Take 1 capsule (30 mg total) by mouth daily. 30 capsule 0   . omeprazole (PRILOSEC) 40 MG capsule Take 40 mg by mouth daily.        . vitamin D  (CHOLECALCIFEROL) 1000 UNIT tablet Take 2,000 Units by mouth daily.     [name] warfarin (COUMADIN) 5 MG tablet Monday AND Wednesday and Saturday pt takes 7.5mg  (1.5 tablets) (Patient taking differently: Take 5 mg by mouth daily.    ) 30 tablet 6   . [DISCONTINUED] amoxicillin-clavulanate (AUGMENTIN) 875-125 MG per tablet Take 1 tablet by mouth 2 (two) times daily.Started taking 07/31/15  Physical Exam:                                                                                   [facility]     Patient Vitals for the past 24 hrs:   BP Temp Temp src Pulse Resp SpO2 Height Weight   04/13/17 0152 (!) 169/135 - - (!) 113 (!) 26 98 % - -   04/13/17 0117 (!) 171/140 - - (!) 113 19 97 % - -   04/13/17 0110 (!) 160/131 - - (!) 118 22 99 % - -   04/13/17 0101 (!) 179/124 - - (!) 113 - - - -   04/13/17 0058 - - - (!) 119 18 - - -   04/13/17 0047 (!) 178/102 98.9 F (37.2 C) Oral (!) 122 20 97 % 1.829 m (6') 95.3 kg (210 lb)     Body mass index is 28.48 kg/m.  No intake or output data in the 24 hours ending 04/13/17 0235    General: awake, alert, oriented x 3; no acute distress.  HEENT: perrla, eomi, sclera anicteric  oropharynx clear without lesions, mucous membranes moist  Neck: supple, no lymphadenopathy, no thyromegaly, no JVD, no carotid bruits  Cardiovascular: regular rate and rhythm, no murmurs, rubs or gallops  Lungs:diminished breath sounds  Abdomen: soft, non-tender, non-distended; no palpable masses, no hepatosplenomegaly, normoactive bowel sounds, no rebound or guarding  Extremities: no clubbing, cyanosis, or edema  Neuro: cranial nerves grossly intact, strength 5/5 in upper and lower extremities, sensation intact,   Skin: no rashes or lesions noted        Labs and Imaging:                                                                              [facility]     Results     Procedure Component Value Units Date/Time    BNP [[id number]] Collected:  04/13/17 0050    Specimen:  Blood Updated:   04/13/17 0229    Troponin I (Stat) [[id number]]  (Abnormal) Collected:  04/13/17 0050    Specimen:  Plasma Updated:  04/13/17 0155     Troponin I 0.04 (H) ng/mL     Basic Metabolic Panel [[id number]]  (Abnormal) Collected:  04/13/17 0050    Specimen:  Plasma Updated:  04/13/17 0150     Sodium 138 mMol/L      Potassium 4.5 mMol/L      Chloride 108 mMol/L      CO2 21.7 mMol/L      Calcium 9.0 mg/dL      Glucose 086 (H) mg/dL      Creatinine 5.78 (H) mg/dL      BUN 17 mg/dL      Anion Gap 46.9 mMol/L      BUN/Creatinine Ratio 10.7 Ratio      EGFR 43 (L) mL/min/1.7m2  Osmolality Calc 279 mOsm/kg     CBC [[id number]]  (Abnormal) Collected:  04/13/17 0050    Specimen:  Blood from Blood Updated:  04/13/17 0125     WBC 13.0 (H) K/cmm      RBC 4.73 M/cmm      Hemoglobin 14.7 gm/dL      Hematocrit 11.9 %      MCV 95 fL      MCH 31 pg      MCHC 33 gm/dL      RDW 14.7 %      PLT CT 248 K/cmm      MPV 7.4 fL      NEUTROPHIL % 79.1 (H) %      Lymphocytes 12.7 (L) %      Monocytes 5.9 %      Eosinophils % 1.8 %      Basophils % 0.5 %      Neutrophils Absolute 10.3 (H) K/cmm      Lymphocytes Absolute 1.7 K/cmm      Monocytes Absolute 0.8 K/cmm      Eosinophils Absolute 0.2 K/cmm      BASO Absolute 0.1 K/cmm           XR Chest 2 Views   Final Result   Mild CHF      ReadingStation:[id number]          EKG afib with rvr     Signed by: Donnie Coffin, DO    I have personally reviewed past records in Epic and recent labs  New orders were placed      [facility]  [address]  [place], [place]    Pager [phone]    AO:[id number], Lysbeth Penner, MD      This note was generated within an electronic medical record, and portions of it may have been completed with voice recognition software. Typographical, word substitution, pronoun and other language errors may occur. If there are substantial concerns about the content of this note that may affect patient care, please contact the author for clarification.

## 2017-04-13 NOTE — Progress Notes (Addendum)
Pt continues with htn despite prn Metoprolol, first dose of Norvasc and one time dose of po hydralazine. MD states he will [place] IV Metoprolol and start some prn hydralazine. Pt asymptomatic. Will monitor.  6:39 PM  Pt resting in bed. Remains Afib 90s-100s. SBP remains 160-170s. MD aware. PRN po hydralazine ordered. Pt had around 1430 and can have again tonight. NPO after MN for TEE/CV. Will give report to nightshift.

## 2017-04-13 NOTE — Consults (Signed)
[facility] Card[facility] and Vascular Medicine   Consultative note      Date Time: 04/13/17 12:28 PM  Patient Name: Christopher Dickerson, Christopher SR.  Requesting Physician: Gailen Shelter, MD  Cardiologist: None  PCP: Gayla Medicus, MD    Subjective   Reason for Consultation:   Atrial fibrillation with RVR    History:   Christopher Grewell Sr. is a 71 y.o. male with past medical history significant for hypertension, paroxysmal atrial fibrillation (on Coumadin),COPD and smoking presented to the hospital due to progressive shortness of breath.    The patient mentions that he's been having difficulty sleeping with progressive shortness of breath over the past 2 days.    Blood pressure on admission was in the 170s over 110s and heart rate was running in the 120s to 130s.    Troponin was negative 3  Creatinine is 1.59 on admission (baseline of 1.1) this is an today to 1.45  Potassium and magnesium are normal  CBC is normal  BNP is  elevated at 1200  INR was subtherapeutic at 1.1  The patient was given Cardizem and metoprolol.on therapeutic doses of Lovenox. On IV Lasix    The patient you about his diagnosis of atrial fibrillation however he has not been taking Coumadin for the past 6 months.     The patient denies chest pain, palpitations, dizziness or syncope. No symptoms suggestive of TIA, stroke or claudication.       Cardiac studies:   EKG April 13, 2017-atrial fibrillation with ventricular heart rate in the 120s and diffuse T-wave inversions.      Recent Labs  Lab 04/13/17  [phone] 04/13/17  0339 04/13/17  0050   Troponin I 0.03* 0.03* 0.04*       Review of Systems:     Comprehensive review of systems performed and is otherwise negative.     Objective     Past Medical History     Past Medical History:   Diagnosis Date   . Arthritis    . Atrial fibrillation    . Chronic obstructive pulmonary disease    . Hypertension        Family History:   History reviewed. No pertinent family history.    Social History:     Social  History   Substance Use Topics   . Smoking status: Current Every Day Smoker     Packs/day: 0.50     Types: Cigarettes   . Smokeless tobacco: Never Used   . Alcohol use Yes      Comment: occasional       Allergies:   Codeine    Medications:     Current Facility-Administered Medications   Medication Dose Route Frequency   . enoxaparin  1 mg/kg Subcutaneous Q12H   . famotidine  20 mg Oral BID   . furosemide  40 mg Intravenous BID   . metoprolol succinate XL  25 mg Oral Daily   . pantoprazole  40 mg Oral QAM AC   . senna-docusate  2 tablet Oral QHS   . sodium chloride (PF)  3 mL Intravenous Q8H   . vitamin D  2,000 Units Oral Daily   . warfarin  5 mg Oral Daily at 1800   . warfarin therapy placeholder  1 each Does not apply See Admin Instructions             Physical Exam:   Temp:  [97.7 F (36.5 C)-98.9 F (37.2 C)] 97.7 F (36.5 C)  Heart Rate:  [100-122] 117  Resp Rate:  [16-28] 16  BP: (156-179)/(100-140) 168/118    Intake/Output Summary (Last 24 hours) at 04/13/17 1228  Last data filed at 04/13/17 1108   Gross per 24 hour   Intake                0 ml   Output             2450 ml   Net            -2450 ml       General appearance - alert, well appearing, and in no distress  Head: normocephalic, atraumatic  Mental status -  Alert and oriented,   Eyes - pupils equal and reactive, extraocular eye movements intact, sclera anicteric  Oropharynx: moist mucous membranes  Neck - supple, no significant adenopathy  Chest - clear to auscultation, no wheezes, rales or rhonchi, symmetric air entry  Heart - normal rate, regular rhythm, normal S1, S2, no murmurs, rubs, clicks or gallops  Abdomen - soft, nontender, nondistended, no masses or organomegaly  Neurological - alert, oriented x3  Extremities - peripheral pulses normal, no pedal edema, no clubbing or cyanosis  Skin - warm and dry, no rashes  Psych- appropriate affect    Labs:       Recent Labs  Lab 04/13/17  0339 04/13/17  0050   Glucose 131* 133*   BUN 16 17    Creatinine 1.45* 1.59*   Sodium 138 138   Potassium 4.9 4.5   Chloride 107 108   CO2 23.0 21.7   Magnesium 2.2  --    AST (SGOT) 33  --    ALT 62*  --      Estimated Creatinine Clearance: 56.3 mL/min (A) (based on SCr of 1.45 mg/dL (H)).            Recent Labs  Lab 04/13/17  0339   WBC 13.2*   Hemoglobin 15.3   Hematocrit 44.9   PLT CT 282   PT INR 1.1         Rads:   Xr Chest 2 Views    Result Date: 04/13/2017  Mild CHF ReadingStation:[id number]         Assessment:     1. History of paroxysmal atrial fibrillation coming in with atrial fibrillation with rapid ventricular response. CHADVASc no at least 2 with an unknown systolic function on Coumadin  2. Acute CHF  3. Subtherapeutic INR  4. Hypertension  5. Smoking and COPD  6. Hypertensive urgency      Plan:      We discussed the diagnosis and the prognosis of atrial fibrillation - CHADVASc 2 with an unknown systolic function.  We discussed the signs and symptoms of bleeding and a stroke and I asked the patient to seek medical attention if either of them are suspected.     We discussed the complications, specifically the cardiac complications, of smoking. I offered her a different options pharmacological as well as nicotine replacement therapy.        1. Switch Lasix 2 tablets 40 mg once a day  2. Discontinue Lovenox and Coumadin xarelto 20 mg  3. Increase Toprol to 50 mg  4. Add amlodipine 5 mg  5. NPO after midnight for possible TEE and cardioversion tomorrow. We will also do a limited transthoracic echocardiogram with the TEE for better evaluation of EF after cardioversion        Christopher Rain, MD  [place] [facility]  Phone number: [phone]  Fax number:  [phone]

## 2017-04-13 NOTE — ED Notes (Signed)
Bed: N5-A  Expected date: 04/13/17  Expected time: 12:42 AM  Means of arrival:   Comments:  EMS SOB 11 male

## 2017-04-13 NOTE — ED Triage Notes (Signed)
pt brought in via ems r/t sob. per report pt has been sob all week and has been working with drywall and feels irritated and constricted. per EMS pt improved after duo-neb treatment. Pt states he called 911 because his sob worsened. Pt states he tried to watch TV and "nothing was helping, I took a ricola and took that dang gone nyquil and nothing helped". Pt reports he has a hx of a-fib, pt states he stopped taking his coumadin because "my buddy told me I didn't need to take it because I am already on blood pressure medicine".

## 2017-04-13 NOTE — Progress Notes (Signed)
--   Patient was seen and examined this morning.  -- No acute overnight event   -- Improved shortness of breath and denies palpitation   -- Vitals and labs reviewed and reordered and added studies   -- Medication reviewed and adjusted  -- Cardiology consulted

## 2017-04-13 NOTE — Plan of Care (Signed)
Problem: Hemodynamic Status: Cardiac  Goal: Stable vital signs and fluid balance  Outcome: Progressing

## 2017-04-13 NOTE — ED Provider Notes (Signed)
EMERGENCY DEPARTMENT  History and Physical Exam       Patient Name: Christopher Dickerson, Christopher SR.  Encounter Date:  04/13/2017  Attending Physician: Rosalyn Gess. Grace Isaac, M.D.  PCP: [place] Nickel Lysbeth Penner, MD  Patient DOB:  [birth year]  MRN:  [id number]  Room:  366/366-A    Medical Decision Making     Patient complaining of shortness of breath. He thinks is due to drywall dust and his underlying COPD. Denies any chest pain. No fevers. Somewhat chronic cough. No leg swelling.  Is a history of A. fib. He stopped taking anticoagulation on his own. He was on Coumadin. States he is still taking his lisinopril and metoprolol.    Lorcet for ACS. I believe his shortness breath is probably driven by his rate and blood pressure. There is no wheezing to suggest COPD exacerbation. No evidence of pneumonia clinically.    ED Course  Patient's given metoprolol.  EKG showed A. fib. Left ventricular hypertrophy with repolarization abnormality, particularly in the lateral leads.  CXR with mild CHF.  I believe this is being driven by rate and elevated BP.  Serial Metoprolol doses.  Rate coming down as is BP.  Due to [facility] Cardizem shortage and patient on a beta-blocker, chose metorolol. BNP pending as Hospitalist admitting.   Dr. Jean Rosenthal evaluating.  Discussed need for further intervention by me.  She indicated she was good.    BNP ultimately elevated.    In addition to the above history, please see nursing notes. Allergies, meds, past medical, family, social hx, and the results of the diagnostic studies performed have been reviewed by myself.         Diagnosis / Disposition     Clinical Impression  1. Atrial fibrillation, unspecified type    2. Acute congestive heart failure, unspecified heart failure type    3. Hypertensive urgency        Disposition  ED Disposition     ED Disposition Condition Date/Time Comment    Admit  Sun Apr 13, 2017  2:43 AM Admitting Physician: Donnie Coffin [[id number]]   Diagnosis: Atrial fibrillation with RVR [119147]    Estimated Length of Stay: 3 - 5 Days   Tentative Discharge Plan?: Home or Self Care [1]   Patient Class: Inpatient [101]              Follow up for Discharged Patients  No follow-up provider specified.    Prescriptions for Discharged Patients  Current Discharge Medication List                      History of Presenting Illness     Chief complaint: Shortness of Breath    HPI/ROS is limited by: none  HPI/ROS given by: patient    Christopher Fenton Sr. is a 71 y.o. male who presents to the ED with complaint of shortness of breath starting about a few days ago. Pt reports he is wheezing. Pt denies chest pain. Pt states the medic said he had a high HR. Pt reports hx of COPD. Pt states he stopped taking his blood thinners against medical advice. Pt denies leg swelling. Pt denies fever. Pt reports a cough. Pt states he was sanding his drywall and there was a lot of dust. Pt reports he smokes.        Review of Systems   Review of Systems   Constitutional: Negative for chills and fever.   HENT: Negative for congestion and sore throat.  Respiratory: Positive for cough, shortness of breath and wheezing.    Cardiovascular: Positive for palpitations. Negative for chest pain and leg swelling.   Gastrointestinal: Negative for abdominal pain, blood in stool, diarrhea, nausea and vomiting.   Genitourinary: Negative for dysuria and hematuria.   Musculoskeletal: Negative for back pain.   Skin: Negative for rash.   Neurological: Negative for weakness and headaches.   All other systems reviewed and are negative.       Other pertinent review of systems findings in history of present illness.  Allergies & Medications     Pt is allergic to codeine.    Current Discharge Medication List      CONTINUE these medications which have NOT CHANGED    Details   lisinopril (PRINIVIL,ZESTRIL) 40 MG tablet Take 0.5 tablets (20 mg total) by mouth daily.  Qty: 30 tablet, Refills: 6      metoprolol succinate XL (TOPROL-XL) 25 MG 24 hr tablet Take  25 mg by mouth daily.      amLODIPine (NORVASC) 5 MG tablet Take 5 mg by mouth daily.         atenolol (TENORMIN) 25 MG tablet Take 1 tablet (25 mg total) by mouth 2 (two) times daily.  Qty: 60 tablet, Refills: 6      lansoprazole (PREVACID) 30 MG capsule Take 1 capsule (30 mg total) by mouth daily.  Qty: 30 capsule, Refills: 0      omeprazole (PRILOSEC) 40 MG capsule Take 40 mg by mouth daily.         vitamin D (CHOLECALCIFEROL) 1000 UNIT tablet Take 2,000 Units by mouth daily.      warfarin (COUMADIN) 5 MG tablet Monday AND Wednesday and Saturday pt takes 7.5mg  (1.5 tablets)  Qty: 30 tablet, Refills: 6              Past Medical History     Pt   Past Medical History:   Diagnosis Date   . Arthritis    . Atrial fibrillation    . Chronic obstructive pulmonary disease    . Hypertension         Past Surgical History     Pt   Past Surgical History:   Procedure Laterality Date   . APPENDECTOMY     . COLONOSCOPY, POLYPECTOMY  12/16/2012    Procedure: COLONOSCOPY, POLYPECTOMY;  Surgeon: Gwenith Spitz, MD;  Location: [facility];  Service: Gastroenterology;  Laterality: N/A;   . EGD, BIOPSY  12/16/2012    Procedure: EGD, BIOPSY;  Surgeon: Gwenith Spitz, MD;  Location: [facility];  Service: Gastroenterology;  Laterality: N/A;   . KNEE ARTHROSCOPY W/ ACL RECONSTRUCTION     . LIFT, ARM (MEDICAL)          Family History     The family history is not on file.     Social History     Pt reports that he has been smoking Cigarettes.  He has been smoking about 0.50 packs per day. He has never used smokeless tobacco. He reports that he drinks alcohol. He reports that he does not use drugs.     Physical Exam     Blood pressure (!) 170/106, pulse 97, temperature 97.7 F (36.5 C), temperature source Oral, resp. rate 16, height 1.829 m, weight 96.7 kg, SpO2 98 %.    Vitals signs reviewed.    Physical Exam   Constitutional: He is oriented to person, place, and time. He appears well-developed and well-nourished. No distress.  HENT:   Head: Normocephalic and atraumatic.   Mouth/Throat: Oropharynx is clear and moist.   Eyes: Pupils are equal, round, and reactive to light. Conjunctivae and EOM are normal.   Neck: Normal range of motion. Neck supple.   Cardiovascular: Normal heart sounds and intact distal pulses.  An irregularly irregular rhythm present. Tachycardia present.    Pulmonary/Chest: Effort normal and breath sounds normal. He has no wheezes. He has no rales.   Abdominal: Soft. Bowel sounds are normal. He exhibits no distension. There is no tenderness. There is no rebound, no guarding and no CVA tenderness.   Musculoskeletal: Normal range of motion. He exhibits no edema or tenderness.   Neurological: He is alert and oriented to person, place, and time. No cranial nerve deficit. He exhibits normal muscle tone.   Skin: Skin is warm and dry.   Psychiatric: He has a normal mood and affect. His behavior is normal.   Nursing note and vitals reviewed.         Diagnostic Results     The results of the diagnostic studies have been reviewed by myself:    Radiologic Studies  Xr Chest 2 Views    Result Date: 04/13/2017  Mild CHF ReadingStation:[id number]           Consultation                Procedures     None          Rosalyn Gess. Grace Isaac, M.D.      ATTESTATIONS      The documentation recorded by my scribe, Vernell Barrier, accurately reflects the services I personally performed and the decisions made by me.  Rosalyn Gess Grace Isaac, M.D.    Note: This chart was generated by the Epic EMR system/speech recognition and may contain inherent errors or omissions not intended by the user. Grammatical errors, random word insertions, deletions, pronoun errors and incomplete sentences are occasional consequences of this technology due to software limitations. Not all errors are caught or corrected. If there are questions or concerns about the content of this note or information contained within the body of this dictation they should be addressed directly  with the author for clarification        Tori Milks, MD  04/13/17 1506

## 2017-04-14 ENCOUNTER — Inpatient Hospital Stay: Payer: Medicare PPO

## 2017-04-14 LAB — CBC AND DIFFERENTIAL
Basophils %: 0.4 % (ref 0.0–3.0)
Basophils Absolute: 0 10*3/uL (ref 0.0–0.3)
Eosinophils %: 2.6 % (ref 0.0–7.0)
Eosinophils Absolute: 0.3 10*3/uL (ref 0.0–0.8)
Hematocrit: 42.8 % (ref 39.0–52.5)
Hemoglobin: 14.9 gm/dL (ref 13.0–17.5)
Lymphocytes Absolute: 1.9 10*3/uL (ref 0.6–5.1)
Lymphocytes: 17.2 % (ref 15.0–46.0)
MCH: 33 pg (ref 28–35)
MCHC: 35 gm/dL (ref 32–36)
MCV: 93 fL (ref 80–100)
MPV: 7.6 fL (ref 6.0–10.0)
Monocytes Absolute: 0.8 10*3/uL (ref 0.1–1.7)
Monocytes: 7.5 % (ref 3.0–15.0)
Neutrophils %: 72.3 % (ref 42.0–78.0)
Neutrophils Absolute: 8.1 10*3/uL (ref 1.7–8.6)
PLT CT: 235 10*3/uL (ref 130–440)
RBC: 4.61 10*6/uL (ref 4.00–5.70)
RDW: 13.1 % (ref 11.0–14.0)
WBC: 11.2 10*3/uL — ABNORMAL HIGH (ref 4.0–11.0)

## 2017-04-14 LAB — BASIC METABOLIC PANEL
Anion Gap: 12.6 mMol/L (ref 7.0–18.0)
BUN / Creatinine Ratio: 14.7 Ratio (ref 10.0–30.0)
BUN: 20 mg/dL (ref 7–22)
CO2: 23.3 mMol/L (ref 20.0–30.0)
Calcium: 9 mg/dL (ref 8.5–10.5)
Chloride: 106 mMol/L (ref 98–110)
Creatinine: 1.36 mg/dL — ABNORMAL HIGH (ref 0.80–1.30)
EGFR: 52 mL/min/{1.73_m2} — ABNORMAL LOW (ref 60–150)
Glucose: 120 mg/dL — ABNORMAL HIGH (ref 71–99)
Osmolality Calc: 279 mOsm/kg (ref 275–300)
Potassium: 3.9 mMol/L (ref 3.5–5.3)
Sodium: 138 mMol/L (ref 136–147)

## 2017-04-14 LAB — PT/INR
PT INR: 1.2 (ref 0.5–1.3)
PT: 11.7 s — ABNORMAL HIGH (ref 9.5–11.5)

## 2017-04-14 LAB — MAGNESIUM: Magnesium: 1.9 mg/dL (ref 1.6–2.6)

## 2017-04-14 MED ORDER — VH WARFARIN PROTOCOL PLACEHOLDER DAY 6 THROUGH DISCHARGE
1.00 | Status: DC
Start: 2017-04-19 — End: 2017-04-15

## 2017-04-14 MED ORDER — VH WARFARIN PROTOCOL PLACEHOLDER DAY 5
1.00 | Status: DC
Start: 2017-04-18 — End: 2017-04-15

## 2017-04-14 MED ORDER — LIDOCAINE VISCOUS 2 % MT SOLN
OROMUCOSAL | Status: AC
Start: 2017-04-14 — End: ?
  Filled 2017-04-14: qty 15

## 2017-04-14 MED ORDER — RIVAROXABAN 20 MG PO TABS
20.00 mg | ORAL_TABLET | Freq: Every day | ORAL | Status: DC
Start: 2017-04-14 — End: 2017-04-14
  Filled 2017-04-14: qty 1

## 2017-04-14 MED ORDER — VH WARFARIN PROTOCOL PLACEHOLDER DAY 4
1.00 | Status: DC
Start: 2017-04-17 — End: 2017-04-15

## 2017-04-14 MED ORDER — MIDAZOLAM HCL 2 MG/2ML IJ SOLN
INTRAMUSCULAR | Status: AC
Start: 2017-04-14 — End: ?
  Filled 2017-04-14: qty 4

## 2017-04-14 MED ORDER — ENOXAPARIN SODIUM 100 MG/ML SC SOLN
1.00 mg/kg | Freq: Two times a day (BID) | SUBCUTANEOUS | Status: DC
Start: 2017-04-14 — End: 2017-04-15
  Administered 2017-04-14 – 2017-04-15 (×2): 90 mg via SUBCUTANEOUS
  Filled 2017-04-14 (×5): qty 1

## 2017-04-14 MED ORDER — VH FENTANYL CITRATE 100 MCG/2 ML (NARRATOR)
INTRAMUSCULAR | Status: AC | PRN
Start: 2017-04-14 — End: 2017-04-14
  Administered 2017-04-14: 50 ug via INTRAVENOUS

## 2017-04-14 MED ORDER — BENZOCAINE 20% MT SOLN (WRAP)
OROMUCOSAL | Status: AC
Start: 2017-04-14 — End: ?
  Filled 2017-04-14: qty 0.28

## 2017-04-14 MED ORDER — VH WARFARIN PROTOCOL PLACEHOLDER DAY 3
1.00 | Status: DC
Start: 2017-04-16 — End: 2017-04-15

## 2017-04-14 MED ORDER — WARFARIN SODIUM 5 MG PO TABS
5.00 mg | ORAL_TABLET | Freq: Every day | ORAL | Status: DC
Start: 2017-04-14 — End: 2017-04-15
  Administered 2017-04-14: 18:00:00 5 mg via ORAL
  Filled 2017-04-14 (×2): qty 1

## 2017-04-14 MED ORDER — MIDAZOLAM HCL 5 MG/5ML IJ SOLN
INTRAMUSCULAR | Status: AC | PRN
Start: 2017-04-14 — End: 2017-04-14
  Administered 2017-04-14: 2 mg via INTRAVENOUS

## 2017-04-14 MED ORDER — METOPROLOL SUCCINATE ER 50 MG PO TB24
50.00 mg | ORAL_TABLET | Freq: Two times a day (BID) | ORAL | Status: DC
Start: 2017-04-14 — End: 2017-04-15
  Administered 2017-04-14: 18:00:00 50 mg via ORAL
  Filled 2017-04-14 (×3): qty 1

## 2017-04-14 MED ORDER — FENTANYL CITRATE (PF) 50 MCG/ML IJ SOLN (WRAP)
INTRAMUSCULAR | Status: AC
Start: 2017-04-14 — End: ?
  Filled 2017-04-14: qty 2

## 2017-04-14 MED ORDER — VH MIDAZOLAM HCL 5 MG/5 ML (NARRATOR)
INTRAMUSCULAR | Status: AC | PRN
Start: 2017-04-14 — End: 2017-04-14
  Administered 2017-04-14: 1 mg via INTRAVENOUS

## 2017-04-14 MED ORDER — VH WARFARIN THERAPY PLACEHOLDER
1.00 | Status: DC
Start: 2017-04-14 — End: 2017-04-15

## 2017-04-14 MED ORDER — FENTANYL CITRATE (PF) 50 MCG/ML IJ SOLN (WRAP)
INTRAMUSCULAR | Status: AC | PRN
Start: 2017-04-14 — End: 2017-04-14
  Administered 2017-04-14: 50 ug via INTRAVENOUS

## 2017-04-14 NOTE — OT Progress Note (Signed)
[facility]  [facility]  [address]  [place] [address]  Department of Rehabilitation Services  [phone]    Devlin Brink. CSN: [id number]  MED TELEMETRY STEP-DOWN 366/366-A    Occupational Therapy General Note  Time: 15:10    Attempted to see pt for OT (eval/tx), spoke with pt- no self care concerns, also per RN & PT not noted OT needs or safety concerns.(H/M, H/T, H/R).    Team communication: RNMarisue Ivan , PT- Autumn    Plan: Will d/c OT order at this time.     Messiyah Waterson Rolland Bimler, OTR/L

## 2017-04-14 NOTE — Progress Notes (Signed)
Pharmacy Warfarin Per Protocol Note  Christopher Fenton Sr.    Age: 71 y.o.  Weight: 92.7 kg (204 lb 5.9 oz)  Indication: A.fib  Goal INR: 2-3  Interacting agents:     Past Medical History:   Diagnosis Date   . Arthritis    . Atrial fibrillation    . Chronic obstructive pulmonary disease    . Hypertension          Recent Labs  Lab 04/14/17  0442 04/13/17  0339   PT INR 1.2 1.1         Recent Labs  Lab 04/14/17  0442   WBC 11.2*   Hemoglobin 14.9   Hematocrit 42.8   PLT CT 235          Recent Labs  Lab 04/13/17  0339   Albumin 3.7       Lab Results   Component Value Date    ALT 62 (H) 04/13/2017    AST 33 04/13/2017    ALKPHOS 102 04/13/2017    BILITOTAL 1.0 04/13/2017     71 yom with hx paroxsymal a.fib admitted today with A.fib w/RVR. Intially on Xarelto, then anticoagulation changed to therapeutic lovenox and warfarin today. Cardioversion planned for today, however TEE showed left atrial appendage thrombus so procedure was cancelled. Plan is for repeat TEE after 3 months of anticoagulation.    Warfarin level 2 protocol ordered.  INR today = 1.2. Warfarin 5mg  will be ordered for today.      If you have any questions, please contact the pharmacist at [phone].    Miachel Roux, PharmD

## 2017-04-14 NOTE — Progress Notes (Addendum)
[facility]   [address]   [place] [address]     Case Manager    Patient identified as a Moderate Risk for readmission based on the current risk score  Estimated D/C Date: Not documented by MD   Risk Score: Moderate, 11%   RX Coverage:   Yes through [employer]   Utilize D/C Medication Program: No, declined. [facility]ses [facility]   Confirmed PCP with patient: name/last visit: Yes; J[place]/ 3 months   Confirmed Transportation to F/u Appt: Yes, drives   PCP F/U Appt request is placed in the Navigator: AAs make appointments per pilot     Anticipated DME needed for D/C: No   Anticipated HH, arrange if appropriate: Declined   Anticipated Placement:  Referral to DCP: No   Inpatient Plan of Care:    Adm with afib with RVR. Telemetry monitoring. TEE/cardioversion aborted as pt was found to have left atrial appendage thrombus. Xarelto started. Toprol increased. Amlodipine continues. Repeat TEE after anticoagulation for 3 months. Cardiology and attending was notified of cost for xarelto and pt's inability to afford. Pt had been on coumadin in the past. Pt will now be on coumadin. CM has a v/m into PCP to determine if they manage coumadin, awaiting return call.Plan to [place] home to spouse once medically cleared, declined HH. CM to follow and assist as needed     CM Interventions:    Electronic chart reviewed. CM assessment completed. CM spoke with pt to discuss [place] plan and [place] needs.   Plan of care discussed with MDs, and RN          04/14/17 1321   Patient Type   Within 30 Days of Previous Admission? No   Healthcare Decisions   Interviewed: Patient   Orientation/Decision Making Abilities of Patient Alert and Oriented x3, able to make decisions   Prior to admission   Prior level of function Independent with ADLs;Ambulates independently   Type of Residence Private residence   Home Layout Two level;Stairs to enter with rails (add number in comment)  (3)   Have running water, electricity, heat, etc? Yes   Living Arrangements  Spouse/significant other   How do you get to your MD appointments? drives   How do you get your groceries? drives   Who fixes your meals? spouse   Who does your laundry? spouse   Who picks up your prescriptions? self   Dressing Independent   Grooming Independent   Feeding Independent   Bathing Independent   Toileting Independent   DME Currently at Home Single point cane;Front wheel walker;Wheelchair-manual  (if needed; Bp cuff)   Home Care/Community Services None   Discharge Planning   Support Systems Spouse/significant other   Patient expects to be discharged to: Home   Anticipated [place] plan discussed with: Same as interviewed   Mode of transportation: Private car (family member)   Consults/Providers   Correct PCP listed in Epic? Yes     Calla Kicks, RN, BSN  Case Manager  [phone]  [phone]: fax

## 2017-04-14 NOTE — Progress Notes (Addendum)
Progress Note - [facility]     Patient Name: Christopher Fenton SR. LOS: 1 days   Attending Physician: Gailen Shelter, MD   Primary Care Physician: Gayla Medicus, MD                                         Preliminary Patient Class:Inpatient   Assessment and Plan:                                                            [facility]     AFIB w RVR,CHAD-VASC 5  Cont Metoprolol XL 50 mg   S/p TEE with left appendage thrombus   DCCV aborted   Cont Lovenox BID  Cont Coumadin : NOAC cost prohibitive   INR checks and appreciate pharmacy   Appreciated Cardiology recs (Dr. 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     COPD  Supportive care    Nutrition: Cardiac Diet  GI Prophylaxis: Pepcid BID  DVT: Lovenox and Coumadin  Full Code    Reviewed labs and appropriately re-ordered. Reviewed all available imagings and reconciled consultants recommendations. Discussed plan with nursing,case management & social work,patient & his/her designated representatives. A total of allotted to complete the aforementioned tasks.    Active Problem List  Active Problems:    Atrial fibrillation with RVR       Subjective and Objective:                         [facility] Christopher Latner Sr. is a 71 y.o. male.  - Seen and examined this AM  -- Felt really well overnight   -- Remained NPO and taken for TEE and DCCV   -- Unfortunately cardioversion aborted due to left atrial appendage thrombus  -- Resumed on [facility] as oral options are cost prohibitive       Patient Vitals for the past 12 hrs:   BP Temp Pulse Resp   04/14/17 1315 125/80 - 95 16   04/14/17 1302 (!) 143/102 - 93  16   04/14/17 1259 125/80 - (!) 106 16   04/14/17 1229 (!) 135/103 - (!) 122 16   04/14/17 1223 (!) 185/99 - (!) 121 16   04/14/17 1000 (!) 156/106 - (!) 107 -   04/14/17 0933 (!) 189/124 - (!) 113 -   04/14/17 0933 - - (!) 112 -   04/14/17 0807 (!) 166/106 - - -   04/14/17 0740 (!) 173/126 97.9 F (36.6 C) (!) 103 17   04/14/17 0539 (!) 178/121 (!) 96.1 F (35.6 C) 100 19     Weight Monitoring 03/23/2015 04/03/2015 04/10/2015 02/12/2016 04/13/2017 04/13/2017 04/13/2017   Height 182.9 cm -  182.9 cm 182.9 cm 182.9 cm 182.9 cm -   Height Method Stated - Stated Stated Stated Stated -   Weight 104.327 kg 91.3 kg 98.6 kg 92.08 kg 95.255 kg 96.707 kg 92.7 kg   Weight Method Estimated Actual Actual Stated Stated Standing Scale -   BMI (calculated) 31.3 kg/m2 - 29.5 kg/m2 27.6 kg/m2 28.5 kg/m2 29 kg/m2 -             Review of Systems   Constitutional: Negative.    HENT: Negative.    Eyes: Negative.    Respiratory: Negative.    Cardiovascular: Negative.    Gastrointestinal: Negative.    Genitourinary: Negative.    Musculoskeletal: Negative.    Skin: Negative.    Neurological: Negative.    Endo/Heme/Allergies: Negative.    Psychiatric/Behavioral: Negative.           Physical Exam   Constitutional: He is oriented to person, place, and time. He appears well-developed and well-nourished.   Eyes: Pupils are equal, round, and reactive to light. Conjunctivae and EOM are normal.   Neck: Normal range of motion. Neck supple.   Cardiovascular: Normal rate, regular rhythm, normal heart sounds and intact distal pulses.    Pulmonary/Chest: Effort normal and breath sounds normal.   Abdominal: Soft. Bowel sounds are normal.   Musculoskeletal: Normal range of motion.   Neurological: He is alert and oriented to person, place, and time. He has normal reflexes.   Skin: Skin is warm and dry.   Psychiatric: He has a normal mood and affect.           Mobility  Level of Assistance (Step 2,3,4,5,6,7): Independent (04/14/17 0800)  Assistive Devices  (Step 5,6,7): None (04/13/17 0401)  Repositioned (Step 1): Turns self (04/14/17 0800)  Positioning Frequency (Step 1): Able to turn self (04/14/17 0800)  Head of Bed Elevated (Step 1,2,3): Self regulated (04/14/17 0800)  Range of Motion (Step 1,2): Active;All extremities (04/14/17 0800)           Labs, Meds, Imaging, Etc. [facility] Hospitalists     Recent Labs  Lab 04/14/17  0442 04/13/17  1409   WBC 11.2* 12.9*   RBC 4.61 4.73   Hemoglobin 14.9 15.3   Hematocrit 42.8 44.1   MCV 93 93   PLT CT 235 268       Recent Labs  Lab 04/14/17  0442 04/13/17  0339   PT 11.7* 10.8   PT INR 1.2 1.1       Recent Labs  Lab 04/13/17  0632 04/13/17  0339 04/13/17  0050   Troponin I 0.03* 0.03* 0.04*     No results found for: [id number]   Recent Labs  Lab 04/14/17  0442 04/13/17  1409 04/13/17  0339   Glucose 120* 95 131*   Sodium 138 138 138   Potassium 3.9 4.1 4.9   Chloride 106 106 107   CO2 23.3 21.2 23.0   BUN 20 19 16    Creatinine 1.36* 1.38* 1.45*   EGFR 52* 51* 48*   Calcium 9.0 9.3 9.2       Recent Labs  Lab 04/14/17  0442 04/13/17  0339   Magnesium 1.9 2.2   Phosphorus  --  3.0   Albumin  --  3.7   Protein, Total  --  7.0   Bilirubin, Total  --  1.0   Alkaline Phosphatase  --  102   ALT  --  62*   AST (SGOT)  --  33           Invalid input(s):  AMORPHOUSUA   Estimated Creatinine Clearance: 54.7 mL/min (A) (based on SCr of 1.36 mg/dL (H)).   Current Facility-Administered Medications   Medication Dose Route Frequency   . amLODIPine  5 mg Oral Daily   . famotidine  20 mg Oral BID   . metoprolol succinate XL  50 mg Oral BID   . pantoprazole  40 mg Oral QAM AC   . senna-docusate  2 tablet Oral QHS   . sodium chloride (PF)  3 mL Intravenous Q8H   . vitamin D  2,000 Units Oral Daily   . warfarin  5 mg Oral Daily at 1800    And   . [START ON 04/16/2017] warfarin protocol placeholder- Day 3  1 each Does not apply See Admin Instructions    And   . [START ON 04/17/2017] warfarin protocol placeholder- DAY 4  1 each Does not apply See  Admin Instructions    And   . [START ON 04/18/2017] warfarin protocol placeholder- DAY 5  1 each Does not apply See Admin Instructions    And   . [START ON 04/19/2017] warfarin protocol placeholder- DAY 6 through discharge  1 each Does not apply See Admin Instructions   . warfarin therapy placeholder  1 each Does not apply See Admin Instructions    PRN Meds: acetaminophen **OR** acetaminophen **OR** acetaminophen, guaiFENesin, hydrALAZINE, metoprolol tartrate, naloxone, ondansetron **OR** ondansetron, polyethylene glycol, zolpidem.      Patient Lines/Drains/Airways Status    Active PICC Line / CVC Line / PIV Line / Drain / Airway / Intraosseous Line / Epidural Line / ART Line / Line / Wound / Pressure Ulcer / NG/OG Tube     Name:   Placement date:   Placement time:   Site:   Days:    Peripheral IV 04/13/17 Left Forearm  04/13/17    0100    Forearm    1               Xr Chest 2 Views    Result Date: 04/13/2017  Mild CHF ReadingStation:[id number]          Advance Directives (For Healthcare)  Advance Directive: Patient does not have advance directive  Reason For NO Advance Directive: Patient has chosen not to complete  Patient Request Advance Directive Information : Yes  Information Provided on Healthcare Directives: Yes  Healthcare Agent Appointed: No  Pre-existing DDNR Order: No  Patient Requests Assistance: No     MRN: [id number]           CSN: [id number]  DOB: 01-Feb-1946   Gailen Shelter, MD  04/14/17 4:04 PM  Available via [facility] Pager - [phone]  Urgent communication: [facility] Mobile -  [phone] [facility]  [address]  [place], [place]  [phone]

## 2017-04-14 NOTE — Plan of Care (Addendum)
Problem: Compromised Hemodynamic Status  Goal: Vital signs and fluid balance maintained/improved  Outcome: Progressing      Problem: Hemodynamic Status: Cardiac  Goal: Stable vital signs and fluid balance  Outcome: Progressing   04/14/17 0957   Goal/Interventions addressed this shift   Stable vital signs and fluid balance Monitor/assess vital signs and telemetry per unit protocol;Weigh on admission and record weight daily;Assess signs and symptoms associated with cardiac rhythm changes;Monitor intake/output per unit protocol and/or LIP order;Monitor for leg swelling/edema and report to LIP if abnormal;Monitor lab values   Patient's vital signs stable.  Daily weight monitored, strict intake and output recorded. Patient NPO and scheduled for cardioversion this AM. Will continue to monitor for leg swelling, abnormal lab values, and s/s of cardiac rhythm changes.

## 2017-04-14 NOTE — UM Notes (Signed)
Christopher Dickerson    Facility :  [facility]    NAME: Christopher Steuber Sr.  MR#: [id number]    CSN#: [id number]    ROOM: 366/366-A AGE: 71 y.o.    ADMIT DATE AND TIME: 04/13/2017 12:46 AM      PATIENT CLASS:  IP     ATTENDING PHYSICIAN: Gailen Shelter, MD  PAYOR:Payor: MEDICARE HMO / Plan: [employer] MEDICARE PPO / Product Type: MANAGED MEDICARE /       AUTH #:     DIAGNOSIS:     ICD-10-CM    1. Atrial fibrillation, unspecified type I48.91    2. Acute congestive heart failure, unspecified heart failure type I50.9    3. Hypertensive urgency I16.0        HISTORY:   Past Medical History:   Diagnosis Date   . Arthritis    . Atrial fibrillation    . Chronic obstructive pulmonary disease    . Hypertension      04/13/2017  Christopher Fenton Sr. is a 71 y.o. male who afib, HTN, tobacco abuse presents to the hospital with shortness of breath.   This is been going on for a couple days. He hasn't been able to do a whole lot because of it. Hasn't been going to the hot doctor much because he doesn't feel like he is being listened to.  He denies chest pain, nausea vomiting diarrhea. He denies recent fevers recent antibiotic use or medication changes. He does say that he changes on medication and stopped Coumadin because he saw a commercial diets that it was bad. We had a long discussion about putting him back on blood thinners.      178/102   160/131 '  HR 113-119    EKG afib with rvr     Atrial fib with RVR  Metoprolol IV prn, may start cardizem if no improvement  Restart coumadin 5mg  pt has not been taking (possibly change to eliquis or xarelto if insurance pays?)  lovenox therapeutic  tele    Hypertensive urgency  bp 169/135   Resume  Metoprolol. Hold ace due to AKI  Hydralazine prn, metoprolol IV prn    Shortness of breath Likely 2/2 CHF due to RVR  Admitting to TELE floor.   Diuresing with Lasix.   Closely monitoring Is/Os, daily weights.   Follow troponin levels.   Volume and  salt restricting.   Monitor chemistries.     AKI  Likely cardiorenal  Recheck in am after diuresing with lasix    Tobacco abuse  Will provide smoking cessation counseling.     GERD  Continuing PPI.     COPD  Supportive care      12/16  Cardiac consult done   Atrial fib with RVR  Metoprolol IV prn, may start cardizem if no improvement  Restart coumadin 5mg  pt has not been taking (possibly change to eliquis or xarelto if insurance pays?)  lovenox therapeutic  tele    Hypertensive urgency  bp 169/135   Resume  Metoprolol. Hold ace due to AKI  Hydralazine prn, metoprolol IV prn    Shortness of breath Likely 2/2 CHF due to RVR  Admitting to TELE floor.   Diuresing with Lasix.   Closely monitoring Is/Os, daily weights.   Follow troponin levels.   Volume and salt restricting.   Monitor chemistries.     AKI  Likely cardiorenal  Recheck in am after diuresing with lasix    Tobacco  abuse  Will provide smoking cessation counseling.     GERD  Continuing PPI.     COPD  Supportive care    Trop  0.03 0.03 0.04  BNP 1219.0    Lopressor IV prn x 4     DATE OF REVIEW: 04/14/2017    VITALS: BP (!) 156/106   Pulse (!) 107   Temp 97.9 F (36.6 C) (Oral)   Resp 17   Ht 1.829 m (6')   Wt 92.7 kg (204 lb 5.9 oz)   SpO2 97%   BMI 27.72 kg/m     Hydralazine  PO prn x 1 this am   Norvasc  Po   Pepcid PO   Protonix ,  Xarelto       Assessment:     1. History of paroxysmal atrial fibrillation coming in with atrial fibrillation with rapid ventricular response. CHADVASc at least 2 with an unknown systolic function on Coumadin  2. Acute CHF  3. Subtherapeutic INR  4. Hypertension  5. Smoking and COPD  6. Hypertensive urgency      Plan:     1. TEE and cardioversion today  2. Continue on amlodipine, xarelto and metoprolol  3. Further recommendations will depend on today's procedure.        Christopher Para RN  [facility]   Utilization Review  Phone  # [phone]  Fax [phone]

## 2017-04-14 NOTE — PT Eval Note (Signed)
[facility]: [facility]  Department of Rehabilitation Services: [phone]  Christopher Dickerson    CSN: [id number]    MED TELEMETRY STEP-DOWN   366/366-A    Physical Therapy Evaluation    Time of treatment:  Time Calculation  PT Received On: 04/14/17  Start Time: 1045  Stop Time: 1056  Time Calculation (min): 11 min    Visit#: 1                                                                                 Precautions and Contraindications:   Falls    Activity Orders: mobility protocol     Clinical Presentation and Decision Making     PT Assessment:  Christopher Fearnow Sr. was admitted 04/13/2017 with shortness of breath. Patient with finding of atrial fibrillation with rapid ventricular response with plan for cardioversion per notes. Patient also with diagnosis of congestive heart failure. Patient with past medical history significant for atrial fibrillation, hypertension and COPD.     Patient presenting with the following PT Impairments:Appears to be at baseline for mobility, Appears to be at baseline for balance    Patient is independent with all functional mobility with no strength or balance deficits noted. Patient is limited by heart rate only during mobility, increased to 149 bpm, with ambulation. Patient is asymptomatic throughout. PT educated patient on how to manually assess heart rate and monitor heart rate with activity with patient demonstrating understanding. Patient with no acute PT needs, will discontinue PT orders.     Due to the presence of no treatment options and 1-2 comorbidities or personal factors that affect performance, as well as patient's stable and/or uncomplicated characteristics, modifications were NOT necessary to complete evaluation when examining total of 4 or more elements (includes body structures and functions, activity limitations and/or participation restrictions) determines the degree of complexity for this patient is LOW    Rehabilitation  Potential:Not an acute PT candidate    Discussed risk, benefits and Plan of Care with: Patient    DISCHARGE RECOMMENDATIONS   DME recommended for Discharge:   None    Discharge Recommendations:   Home with no needs       Development of Plan of Care:   Treatment/interventions: No skilled PT interventions needed at this time.    Treatment Frequency: one time visit    History:   History of Present Illness:    Medical Diagnosis: Atrial fibrillation, unspecified type [I48.91]  Atrial fibrillation with RVR [I48.91]    Christopher Czerwinski Sr. is a 71 y.o. male admitted on 04/13/2017 with shortness of breath. Patient with finding of atrial fibrillation with rapid ventricular response with plan for cardioversion per notes. Patient also with diagnosis of congestive heart failure.     Patient Active Problem List   Diagnosis   . Abdominal pain   . Hypertension   . Atrial fibrillation   . Nausea   . Atrial fibrillation with RVR        X-Rays/Tests/Labs:  Xr Chest 2 Views  Result Date: 04/13/2017  Mild CHF     Troponin I   Troponin I (Stat)   Collected: 04/13/17 0050   Resulting  lab: [facility]   Reference range: 0.00 - 0.02 ng/mL   Value: 0.04 (High)     B-Natriuretic Peptide   BNP   Collected: 04/13/17 0050   Resulting lab: [facility]   Reference range: 0.0 - 100.0 pg/mL   Value: 1,219.0 (High)     Past Medical/Surgical History:  Past Medical History:   Diagnosis Date   . Arthritis    . Atrial fibrillation    . Chronic obstructive pulmonary disease    . Hypertension       Past Surgical History:   Procedure Laterality Date   . APPENDECTOMY     . COLONOSCOPY, POLYPECTOMY  12/16/2012    Procedure: COLONOSCOPY, POLYPECTOMY;  Surgeon: Gwenith Spitz, MD;  Location: [facility];  Service: Gastroenterology;  Laterality: N/A;   . EGD, BIOPSY  12/16/2012    Procedure: EGD, BIOPSY;  Surgeon: Gwenith Spitz, MD;  Location: [facility];  Service: Gastroenterology;  Laterality: N/A;   .  KNEE ARTHROSCOPY W/ ACL RECONSTRUCTION     . LIFT, ARM (MEDICAL)           Social History:  Home Living Arrangements:  Living Arrangements: Spouse/significant other  Assistance Available: Part time, wife works during the day   Type of Home: House  Home Layout: Multi-level, Able to live on main level with bedroom and bathroom, 3 steps to enter home with one hand rail. Patient has a basement that he likes to use however does not have to use.     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 Patient reports independence with all mobility and ADLs prior to admission.   Fall history: Patient denies falls in past year.     DME available at home:  Front wheeled walker  Single point cane  Shower chair    Subjective   "I feel ok." Patient reported regarding heart rate during ambulation.    Patient is agreeable to participation in the therapy session. Nursing clears patient for therapy.    Patient/caregiver goal for PT: to go home     Pain:  At Rest: 0/10  With Activity: 0/10  Location: N/A  Interventions: None required    Examination of Body Systems (Structures, Function, Activity and Participation)   Patient's medical condition is appropriate for Physical therapy intervention at this time    Observation of patient:  Patient is seated at edge of bed with telemetry     Cognition:  Oriented to: Oriented x4  Command following: Follows ALL commands and directions without difficulty  Alertness/Arousal: Appropriate responses to stimuli   Attention Span:Appears intact  Memory: Appears intact  Safety Awareness: Independent  Insights: Educated in safety awareness    Vital Signs (Cardiovascular):  BP Sitting: 150/74 mmHg RUE  HR Sitting:  119 bpm  HR with activity: 149 bpm  SpO2 at rest: 95% on RA   SpO2 with activity: 92% on RA     Sensation: grossly intact for light touch sensation in bilateral LEs      Balance:  Static  Sitting:  Good  Dynamic Sitting:  Good  Static Standing:  Good  Dynamic Standing:  Good      Musculoskeletal Examination:   Range of motion:  Right UE: Grossly WFL  Left UE: Grossly WFL  Right LE: Grossly WFL  Left LE: Grossly WFL       Strength:  Right  UE: Grossly WFL  Left UE: Grossly WFL  Right Hip Flexion:  5/5  Right Knee Flexion:  5/5  Right Knee Extension: 5/5  Right Ankle Dorsiflexion:  5/5  Left Hip Flexion:  5/5  Left Knee Flexion:  5/5  Left Knee Extension:  5/5  Left Ankle Dorsiflexion:  5/5    Functional Mobility:  Bed Mobility:  Supine to Sit:   Independent to the left.        Sit to Supine:   Independent to the left.        Seated Scooting:   Independent    Transfers:  Sit to Stand:  Independent with No assistive device.         Stand to Sit:  Independent.           Locomotion:  LEVEL AMBULATION:  Distance: 100 feet    Assistance level:  Independent  Device:  No assistive device  Pattern:  WFL = Within functional limits  Ambulation distance limited only due to patient's heart rate - patient asymptomatic     AM-PACT "6 Clicks" Basic Mobility Inpatient Short Form  Turning Over in Bed: None  Sitting Down On/Standing From Armchair: None  Lying on Back to Sitting on Side of Bed: None  Assist Moving to/from Bed to Chair: None  Assist to Walk in Hospital Room: None  Assist to Climb 3-5 Steps with Railing: None (per PT judgement )  PT Basic Mobility Raw Score: 24  CMS 0-100% Score: 0.00% Mobility G Code Set  Mobility, Current Status (Z6109): 0 percent impaired, limited or restricted  Mobility, Goal Status (U0454): 0 percent impaired, limited or restricted  Mobility, D/C Status (U9811): 0 percent impaired, limited or restricted  Tools used to determine level of impairment: AM-PACT "6 Clicks" Basic Mobility Score               Participation and Activity Tolerance:  Participation effort: Good  Activity Tolerance: Tolerates 10-20 minutes of activity without rest breaks, Limited by heart rate     Treatment  Interventions this session:   Evaluation  Patient/family/caregiver education    Education Provided:   TOPICS: role of physical therapy, plan of care, goals of therapy and benefits of activity, monitoring of heart rate with activity - patient educated on how to assess heart rate independently      Learner educated: Patient  Method: Explanation and Demonstration  Response to education: Verbalized understanding and Demonstrated understanding    Patient Position at End of Treatment:   Sitting, at the edge of the bed, Needs in reach and No distress    Team Communication:     Spoke to: RN/LPN - Crissy   Regarding: Pre-session re: patient status, Patient position at end of session, Patient participation with Therapy, Vital signs, Further recommendations  Whiteboard updated: No  PT/PTA communication: via written note and verbal communication as needed.      Recommend patient sit up for meals and ambulate in hallways at least 3x daily outside of PT sessions.     Earlean Shawl, PT, DPT

## 2017-04-14 NOTE — Progress Notes (Signed)
NPO for cardioversion,tele shows a-fib 80-110,s.

## 2017-04-14 NOTE — Sedation Documentation (Signed)
Pt educated on TEE/CV.  Understanding stated.  No concerns expressed.

## 2017-04-14 NOTE — Progress Notes (Addendum)
Cardiology Progress Note      Date Time: 04/14/17 8:15 AM  Patient Name: Christopher Dickerson, Christopher SR.      Assessment:     1. History of paroxysmal atrial fibrillation coming in with atrial fibrillation with rapid ventricular response. CHADVASc at least 2 with an unknown systolic function on Coumadin  2. Acute CHF  3. Subtherapeutic INR  4. Hypertension  5. Smoking and COPD  6. Hypertensive urgency      Plan:     1. TEE and cardioversion today  2. Continue on amlodipine, xarelto and metoprolol  3. Further recommendations will depend on today's procedure.    Addendum:  TEE revealed left atrial appendage thrombus  Cardioversion was aborted  We'll increase xarelto to 20 mg  We will increase Toprol to 50 mg twice a day  We'll probably start ACE inhibitor tomorrow  Repeat TEE after anticoagulation for 3 months    Subjective:   Mild shortness of breath.  The patient denies chest pain, worsening DIB (difficulty in breathing), palpitations or dizziness.       Physical Exam:     Temp:  [96.1 F (35.6 C)-97.9 F (36.6 C)] 97.9 F (36.6 C)  Heart Rate:  [97-117] 103  Resp Rate:  [16-19] 17  BP: (158-178)/(100-132) 166/106    Wt Readings from Last 3 Encounters:   04/13/17 92.7 kg (204 lb 5.9 oz)   02/12/16 92.1 kg (203 lb)   04/10/15 98.6 kg (217 lb 6 oz)            Intake/Output Summary (Last 24 hours) at 04/14/17 0815  Last data filed at 04/14/17 0539   Gross per 24 hour   Intake                0 ml   Output             1700 ml   Net            -1700 ml       General appearance - alert, well appearing, and in no distress  Chest -clear to auscultation, no wheezes, rales or rhonchi, symmetric air entry  Heart - irRegular, 2/6 systolic murmur  Extremities - peripheral pulses normal, no pedal edema, no clubbing or cyanosis  Abd: soft. Non-tender, non-distended,  Neuro: alert and oriented  Skin: no rashes  Psych: normal affect    Medications:     Scheduled Meds:   amLODIPine 5 mg Oral Daily   famotidine 20 mg Oral BID    metoprolol succinate XL 50 mg Oral Daily   pantoprazole 40 mg Oral QAM AC   rivaroxaban 15 mg Oral Daily with dinner   senna-docusate 2 tablet Oral QHS   sodium chloride (PF) 3 mL Intravenous Q8H   vitamin D 2,000 Units Oral Daily             Labs:       Recent Labs  Lab 04/14/17  0442 04/13/17  1409 04/13/17  0339   Glucose 120* 95 131*   BUN 20 19 16    Creatinine 1.36* 1.38* 1.45*   Sodium 138 138 138   Potassium 3.9 4.1 4.9   Chloride 106 106 107   CO2 23.3 21.2 23.0   Magnesium 1.9  --  2.2   AST (SGOT)  --   --  33   ALT  --   --  62*     Estimated Creatinine Clearance: 54.7 mL/min (A) (based on SCr of 1.36 mg/dL (  H)).    No results for input(s): CHOL, TRIG, HDL, LDL in the last 8760 hours.      Recent Labs  Lab 04/14/17  0442 04/13/17  1409 04/13/17  0339   WBC 11.2* 12.9* 13.2*   Hemoglobin 14.9 15.3 15.3   Hematocrit 42.8 44.1 44.9   PLT CT 235 268 282   PT INR 1.2  --  1.1         Recent Labs  Lab 04/13/17  0050 04/13/17  0339 04/13/17  0632   TROPI 0.04* 0.03* 0.03*       No results for input(s): TSH in the last 8760 hours.        Harlin Rain, MD   [facility] cardiac and vascular medicine

## 2017-04-14 NOTE — Plan of Care (Signed)
Problem: Impaired Mobility  Goal: Mobility/Activity is maintained at optimal level for patient  Outcome: Progressing  Patients is self care.

## 2017-04-14 NOTE — Progress Notes (Signed)
Assumed care of patient. Patient is in bed with wheels locked and bed in lowest position. IV and tele intact. No needs expressed at this time. Will continue to monitor.

## 2017-04-15 ENCOUNTER — Other Ambulatory Visit: Payer: Self-pay | Admitting: Cardiovascular Disease

## 2017-04-15 DIAGNOSIS — I429 Cardiomyopathy, unspecified: Secondary | ICD-10-CM

## 2017-04-15 DIAGNOSIS — I513 Intracardiac thrombosis, not elsewhere classified: Secondary | ICD-10-CM

## 2017-04-15 LAB — CBC AND DIFFERENTIAL
Basophils %: 0.4 % (ref 0.0–3.0)
Basophils Absolute: 0 10*3/uL (ref 0.0–0.3)
Eosinophils %: 2.9 % (ref 0.0–7.0)
Eosinophils Absolute: 0.3 10*3/uL (ref 0.0–0.8)
Hematocrit: 45.1 % (ref 39.0–52.5)
Hemoglobin: 14.9 gm/dL (ref 13.0–17.5)
Lymphocytes Absolute: 1.9 10*3/uL (ref 0.6–5.1)
Lymphocytes: 20.9 % (ref 15.0–46.0)
MCH: 31 pg (ref 28–35)
MCHC: 33 gm/dL (ref 32–36)
MCV: 94 fL (ref 80–100)
MPV: 7.6 fL (ref 6.0–10.0)
Monocytes Absolute: 0.8 10*3/uL (ref 0.1–1.7)
Monocytes: 8.4 % (ref 3.0–15.0)
Neutrophils %: 67.3 % (ref 42.0–78.0)
Neutrophils Absolute: 6.2 10*3/uL (ref 1.7–8.6)
PLT CT: 222 10*3/uL (ref 130–440)
RBC: 4.81 10*6/uL (ref 4.00–5.70)
RDW: 13 % (ref 11.0–14.0)
WBC: 9.2 10*3/uL (ref 4.0–11.0)

## 2017-04-15 LAB — BASIC METABOLIC PANEL
Anion Gap: 12.1 mMol/L (ref 7.0–18.0)
BUN / Creatinine Ratio: 16.6 Ratio (ref 10.0–30.0)
BUN: 25 mg/dL — ABNORMAL HIGH (ref 7–22)
CO2: 25.2 mMol/L (ref 20.0–30.0)
Calcium: 9.2 mg/dL (ref 8.5–10.5)
Chloride: 104 mMol/L (ref 98–110)
Creatinine: 1.51 mg/dL — ABNORMAL HIGH (ref 0.80–1.30)
EGFR: 46 mL/min/{1.73_m2} — ABNORMAL LOW (ref 60–150)
Glucose: 114 mg/dL — ABNORMAL HIGH (ref 71–99)
Osmolality Calc: 279 mOsm/kg (ref 275–300)
Potassium: 4.3 mMol/L (ref 3.5–5.3)
Sodium: 137 mMol/L (ref 136–147)

## 2017-04-15 LAB — MAGNESIUM: Magnesium: 2.1 mg/dL (ref 1.6–2.6)

## 2017-04-15 LAB — PT/INR
PT INR: 1.1 (ref 0.5–1.3)
PT: 11.2 s (ref 9.5–11.5)

## 2017-04-15 MED ORDER — FUROSEMIDE 40 MG PO TABS
40.00 mg | ORAL_TABLET | Freq: Every day | ORAL | Status: DC
Start: 2017-04-15 — End: 2017-04-15
  Administered 2017-04-15: 10:00:00 40 mg via ORAL
  Filled 2017-04-15: qty 1

## 2017-04-15 MED ORDER — ENOXAPARIN SODIUM 100 MG/ML SC SOLN
1.00 mg/kg | Freq: Two times a day (BID) | SUBCUTANEOUS | 0 refills | Status: AC
Start: 2017-04-15 — End: 2017-04-30

## 2017-04-15 MED ORDER — LISINOPRIL 5 MG PO TABS
5.00 mg | ORAL_TABLET | Freq: Every day | ORAL | Status: DC
Start: 2017-04-15 — End: 2017-04-15
  Administered 2017-04-15: 11:00:00 5 mg via ORAL
  Filled 2017-04-15 (×2): qty 1

## 2017-04-15 MED ORDER — CARVEDILOL 25 MG PO TABS
25.00 mg | ORAL_TABLET | Freq: Two times a day (BID) | ORAL | Status: DC
Start: 2017-04-15 — End: 2017-04-15
  Administered 2017-04-15: 10:00:00 25 mg via ORAL
  Filled 2017-04-15 (×2): qty 1

## 2017-04-15 MED ORDER — LISINOPRIL 5 MG PO TABS
5.00 mg | ORAL_TABLET | Freq: Every day | ORAL | 0 refills | Status: AC
Start: 2017-04-15 — End: 2017-05-15

## 2017-04-15 MED ORDER — CARVEDILOL 25 MG PO TABS
25.00 mg | ORAL_TABLET | Freq: Two times a day (BID) | ORAL | 0 refills | Status: AC
Start: 2017-04-15 — End: 2017-05-15

## 2017-04-15 MED ORDER — WARFARIN SODIUM 5 MG PO TABS
5.00 mg | ORAL_TABLET | Freq: Every day | ORAL | 0 refills | Status: AC
Start: 2017-04-15 — End: 2017-04-25

## 2017-04-15 MED ORDER — FUROSEMIDE 40 MG PO TABS
40.00 mg | ORAL_TABLET | Freq: Two times a day (BID) | ORAL | Status: DC
Start: 2017-04-15 — End: 2017-04-15
  Filled 2017-04-15: qty 1

## 2017-04-15 MED ORDER — FUROSEMIDE 40 MG PO TABS
40.00 mg | ORAL_TABLET | Freq: Every day | ORAL | 0 refills | Status: AC
Start: 2017-04-16 — End: 2017-05-16

## 2017-04-15 NOTE — Progress Notes (Addendum)
Cardiology Progress Note      Date Time: 04/15/17 9:08 AM  Patient Name: Christopher Dickerson, Christopher SR.      Assessment:     1. Persistent atrial fibrillation with CHADVASc of 3  2. Thrombus in the left atrial appendage  3. Systolic dysfunction, newly detected  4. Hypertension  5. Smoking and COPD  6. Hypertensive urgency; Improving   7. Cannot afford NOAC      Plan:     TEE showed the systolic function to be severely depressed around 15%. The right ventricle systolic function is mildly depressed. The left atrium is moderately dilated. Mobile thrombus present in the left atrial appendage. No significant valvular normalities detected.    1. Continue on Coumadin for a goal INR of 2-3; continue on Lovenox until INR is reached.  2. Discontinue amlodipine  3. Switch from Lasix 40 tablets twice a day to 40 mg once a day  4. Switch from Toprol to carvedilol 25 mg twice a day  5. Continue with lisinopril. Creatinine was mildly elevated today.  6. Nuclear stress test to evaluate for an ischemic etiology for his cardiomyopathy; same day as follow-up in the office  7. TEE to be repeated in 2 months after anticoagulation    We'll have him follow-up in the office within 2-3 weeks; I will have our office contact him to schedule a follow-up as well as the stress test        Subjective:   Mild shortness of breath.  The patient denies chest pain, worsening DIB (difficulty in breathing), palpitations or dizziness.       Physical Exam:     Temp:  [97.5 F (36.4 C)-98.2 F (36.8 C)] 98.1 F (36.7 C)  Heart Rate:  [83-122] 106  Resp Rate:  [14-18] 17  BP: (124-189)/(80-129) 161/95    Wt Readings from Last 3 Encounters:   04/14/17 91.2 kg (201 lb 1.6 oz)   02/12/16 92.1 kg (203 lb)   04/10/15 98.6 kg (217 lb 6 oz)            Intake/Output Summary (Last 24 hours) at 04/15/17 0908  Last data filed at 04/15/17 0800   Gross per 24 hour   Intake              640 ml   Output              900 ml   Net             -260 ml       General  appearance - alert, well appearing, and in no distress  Chest -clear to auscultation, no wheezes, rales or rhonchi, symmetric air entry  Heart - irRegular, 2/6 systolic murmur  Extremities - peripheral pulses normal, no pedal edema, no clubbing or cyanosis  Abd: soft. Non-tender, non-distended,  Neuro: alert and oriented  Skin: no rashes  Psych: normal affect    Medications:     Scheduled Meds:     amLODIPine 5 mg Oral Daily   enoxaparin 1 mg/kg Subcutaneous Q12H   famotidine 20 mg Oral BID   furosemide 40 mg Oral BID   lisinopril 5 mg Oral Daily   metoprolol succinate XL 50 mg Oral BID   pantoprazole 40 mg Oral QAM AC   senna-docusate 2 tablet Oral QHS   sodium chloride (PF) 3 mL Intravenous Q8H   vitamin D 2,000 Units Oral Daily   warfarin 5 mg Oral Daily at 1800   And      [  START ON 04/16/2017] warfarin protocol placeholder- Day 3 1 each Does not apply See Admin Instructions   And      [START ON 04/17/2017] warfarin protocol placeholder- DAY 4 1 each Does not apply See Admin Instructions   And      [START ON 04/18/2017] warfarin protocol placeholder- DAY 5 1 each Does not apply See Admin Instructions   And      [START ON 04/19/2017] warfarin protocol placeholder- DAY 6 through discharge 1 each Does not apply See Admin Instructions   warfarin therapy placeholder 1 each Does not apply See Admin Instructions             Labs:       Recent Labs  Lab 04/15/17  0416 04/14/17  0442 04/13/17  1409 04/13/17  0339   Glucose 114* 120* 95 131*   BUN 25* 20 19 16    Creatinine 1.51* 1.36* 1.38* 1.45*   Sodium 137 138 138 138   Potassium 4.3 3.9 4.1 4.9   Chloride 104 106 106 107   CO2 25.2 23.3 21.2 23.0   Magnesium 2.1 1.9  --  2.2   AST (SGOT)  --   --   --  33   ALT  --   --   --  62*     Estimated Creatinine Clearance: 49.2 mL/min (A) (based on SCr of 1.51 mg/dL (H)).    No results for input(s): CHOL, TRIG, HDL, LDL in the last 8760 hours.      Recent Labs  Lab 04/15/17  0416 04/14/17  0442 04/13/17  1409 04/13/17  0339    WBC 9.2 11.2* 12.9* 13.2*   Hemoglobin 14.9 14.9 15.3 15.3   Hematocrit 45.1 42.8 44.1 44.9   PLT CT 222 235 268 282   PT INR 1.1 1.2  --  1.1         Recent Labs  Lab 04/13/17  0050 04/13/17  0339 04/13/17  0632   TROPI 0.04* 0.03* 0.03*       No results for input(s): TSH in the last 8760 hours.        Harlin Rain, MD   [facility] cardiac and vascular medicine

## 2017-04-15 NOTE — Discharge Instr - AVS First Page (Addendum)
Patient Education  Open reading mode   How and Where to Give Subcutaneous Injections Using a Prefilled Syringe  Elsevier Interactive Patient Education 2017 [employer].  Last revised: Sep 18, 2015.    A subcutaneous injection is a shot of medicine that is given into the layer of fat that lies between skin and muscle. The injection is given with a single-use syringe that already has medicine inside of it (prefilled syringe).  Use only the syringe, needle, and medicine that your health care provider prescribes. Use each syringe and needle only one time.  SUPPLIES THAT YOU WILL NEED  Syringe.  Needle. Use the needle length and size (gauge) that your health care provider or pharmacist gives to you.  Medicine prescribed by your health care provider.  Alcohol wipes.  Bandage.  A container for syringe disposal. This may be a puncture-proof sharps container or a hard-sided plastic container that has a secure lid, such as an empty laundry detergent bottle.  HOW TO CHOOSE A SITE FOR INJECTION    Follow instructions from your health care provider about where to give an injection. You can give an injection in the abdomen, the thigh, or the back of the arm. Give the injection in a different spot each time.  HOW TO GIVE AN INJECTION USING A PREFILLED SYRINGE  1. Wash your hands with soap and water. If soap and water are not available, use hand sanitizer.  2. Clean the injection site with an alcohol wipe as told by your health care provider, then let the site air-dry.  3. Remove the plastic cover from the needle on the syringe. Do notlet the needle touch anything.  4. Hold the syringe with the needle pointing up. Check the syringe for any remaining air bubbles. If there are air bubbles, flick the syringe with your finger until the air bubbles rise to the top. Then, gently push on the plunger until you can see a drop of medicine appear at the tip of the needle. This will clear any remaining air bubbles from the syringe.  5. Hold  the syringe in your writing hand like a pencil.  6. Use your other hand to pinch about an inch of skin, including the area that you cleaned. Do notdirectly touch the cleaned part of the skin.  7. Insert the entire needle straight into the fold of skin. The needle should be at a 90-degree angle (perpendicular) to the skin, as if to form the letter "L." For example, if you are giving an injection in the arm, the arm should form one leg of the "L" and the needle should form the other leg of the "L." Push the needle all the way against the skin. The needle may need to be injected at a 45-degree angle in thin adults or children who have a small amount of body fat. Think of a 45-degree angle like the letter "V."  8. After the needle is completely inserted into the skin, release the skin that you are pinching. Continue to hold the syringe with your writing hand.  9. Using your non-writing hand, pull back slightly on the plunger to see if any blood enters the syringe. If there is blood, do notinject the medicine. Remove the needle and start over with a new needle and syringe.  10. If there is no blood, use the thumb of your writing hand to push the plunger until the syringe is empty. If the medicine is a gel, you may need to push harder   on the plunger to press the medicine out of the syringe.  11. Pull the needle straight out of the skin.  12. Press and hold the alcohol wipe over the injection site until bleeding stops. Do notrub the area.  13. Cover the injection site with a bandage.  HOW TO SAFELY THROW AWAY THE SUPPLIES  If you are using a syringe that does nothave a safety system for shielding the needle after injection:  Do notrecap the needle. Place the syringe and needle in the disposal container.  If your syringe has a safety system for shielding the needle after injection:  Firmly push down on the plunger after you complete the injection. The protective sleeve will automatically cover the needle, and you will hear  a click. The click means that the needle is safely covered.  Follow the disposal regulations for the area where you live. Do notuse any syringe or needle more than one time.  SEEK MEDICAL CARE IF:  You have difficulty giving the injection.  You think that the injection was not given correctly.  You have difficulty with any of the supplies.  The medicine causes side effects.  Rashes develop on the skin.  A fever develops.  The condition that is being treated gets worse.  SEEK IMMEDIATE MEDICAL CARE IF:  Any of these symptoms develop after the injection is given:  Difficulty breathing.  Chest pain.  A rash over most or all of the body.  Swelling of the lips or tongue.  Difficulty swallowing.        Discharge Instructions for Cardiomyopathy  Cardiomyopathymeans that your heart is not functioning normally. It can make it more difficult to do things that may have been easy for you in the past. But with proper treatment and some lifestyle changes,you and your doctor can help your heart do its job.  Home care   Work hard to remove the salt from your diet.   Limit canned, dried, packaged, and fast foods.   Don't add salt to your food at the table.   Season foods with herbs instead of salt when you cook.   Limit fluid intake as instructed by your doctor.   Be as active as you can.Ask your doctor how to get started. Simple activities such as walking or gardening can help.   Break the smoking habit. Enroll in a stop-smoking program to improve your chances of success.   Take your medications exactly as directed. Don't skip doses. Don't stop taking your medications without talking to your doctor first.   Visit your doctor regularly. Mention any problems with your treatment plan.   Weigh yourself at the same time each day. Wear the same clothing each time. Keep a written record of your daily weight.   Limit alcohol intake. Too much alcohol is not good for the heart. Doctors advise no more than one drink per day for  women and two drinks per day for men.  Follow-up care  Make a follow-up appointment as directed by our staff.    When to call your doctor  Call your doctor right away if you have any of the following:   You gain more than 3 pounds in 1 day, more than 5 pounds in 1 week, or whatever weight gain you were told to report by your doctor   New or increasedchest pain that is not relieved by medication   New or increasedshortness of breath or coughing   Weakness in the muscles of your face, arms,  or legs   Trouble speaking   Rapid pulse or pounding heartbeat   Fainting   New or increased swelling in your hands, feet, or ankles   Date Last Reviewed: 07/03/2012   2000-2016 [employer] [address], [place], [place] [address]. All rights reserved. This information is not intended as a substitute for professional medical care. Always follow your healthcare professional's instructions.        Activity: As Tolerated                  Weigh every morning      Diet:  Cardiac Low Saturated Fat / Low Cholesterol Diet  2 Gram Sodium (Salt) Restricted Diet  Fluid Restriction Diet  1500 ml per day      Notify Physician:  Shortness of breath, Swelling, Vomiting and diarrhea, Fever, Chest pain, Unrelieved pain and weight gain of 3 pounds in 1 day or 5 pounds in 1 week    Should your symptoms worsen or recur, contact your physician.    In the event of an emergency, severe shortness of breath, unrelieved chest pain or chest pressure / tightness, dial 911.

## 2017-04-15 NOTE — Progress Notes (Signed)
1200:  Pt refused wheel chair and walked to the main entrance to meet his ride.  Pt aware to pick up rx for Lovenox at [facility]  Pt states his "friend is going to bring money with him so I don't have to drive home and then back up here."    Pt leaving with rx's along with a Coumadin rx to take to his local [facility].

## 2017-04-15 NOTE — Plan of Care (Signed)
Problem: Inadequate Gas Exchange  Goal: Adequate oxygenation and improved ventilation   04/15/17 1322   Goal/Interventions addressed this shift   Adequate oxygenation and improved ventilation Assess lung sounds;Monitor SpO2 and treat as needed;Monitor and treat ETCO2;Provide mechanical and oxygen support to facilitate gas exchange;Position for maximum ventilatory efficiency;Plan activities to conserve energy: plan rest periods;Increase activity as tolerated/progressive mobility;Consult/collaborate with Respiratory Therapy   12/18- Assumed care of patient. Monitored patient's vitals and lung sounds. Patient is stable. No further actions needed at this time.

## 2017-04-15 NOTE — Progress Notes (Signed)
Pt is for [place] to home setting today. CM called Dr. Mardee Postin office again and spoke w/ Judeth Cornfield. CM explained that CM called yesterday and was waiting for return call to see if Dr. Roswell Nickel will follow INR levels. Judeth Cornfield stated that MD not present today. CM explained that pt is ready for [place] today and a POC is needed. After a brief hold, Judeth Cornfield told CM that Pt can come in this Friday at 11:30 12/21 for hospital follow up. CM explained Pt will need INR draw. Judeth Cornfield reports that Dr. Roswell Nickel will set up pt with the coumadin clinic. CM spoke w/ Pt about agreeing to [facility] services for INR follow up and lovenox to coumadin bridging. Pt declined HH services stating he does not want to be homebound and he reports to plan to return to work today as he has "so much to do". Lovenox script was sent to [facility] for cost; $87.44; to which Pt quickly reported he can pay. He went to look for his wallet but not present. Pt then called son, who is picking him up, and son going to pay for lovenox. Education was provided by primary nurse about lovenox and coumadin; Pt verbalized understanding. Pt reports he will get his other [place] meds at his local phamarcy after [place]. No further [place] needs.     Rosine Abe, RN, BSN  Nurse Case Manager  [phone]

## 2017-04-15 NOTE — Discharge Summary (Signed)
DISCHARGE SUMMARY     Patient Name: Christopher Dickerson, Christopher SR.   Attending Physician: Gailen Shelter, MD   Primary Care Physician: Gayla Medicus, MD   Date of Admission:   04/13/2017   Date of Discharge:   04/15/2017 LOS: 2 days   Admission Assessment and Plan by Jean Rosenthal (Copied from H&P)   Discharge Diagnoses:     Christopher Corp Sr. is a 71 y.o. male who afib, HTN, tobacco abuse presents to the hospital with shortness of breath.   This is been going on for a couple days. He hasn't been able to do a whole lot because of it. Hasn't been going to the hot doctor much because he doesn't feel like he is being listened to.  He denies chest pain, nausea vomiting diarrhea. He denies recent fevers recent antibiotic use or medication changes. He does say that he changes on medication and stopped Coumadin because he saw a commercial diets that it was bad. We had a long discussion about putting him back on blood thinners.   Active Problems:    Atrial fibrillation with RVR  Resolved Problems:    * No resolved hospital problems. [facility] Course       Patient admitted with impression of Atrial Fibrillation with RVR and mild CHF exacerbation. Diuresed with improvement of his respiratory symptoms and ambulated around without difficulty. Patient had self discontinued Coumadin and but resumed during this admission. Cardiology was consulted and taken for TEE and DCCV. Procedure was aborted due to possible thrombus in left atrial appendage. Rate controlled optimized and underwent ECHO with severely reduced EF 15%. Furthermore,goal directed medically therapy instituted and optimized. NOACs recommend but cost prohibitive for the patient. Therapeutic bridging with Lovenox to coumadin started given suboptimal INR. Patient was highly advised for inpatient monitoring but  requested a prompt discharge due to personal reason. Close follow up arranged for INR checks,PCP and cardiology clinic.     Discharge Condition: good    Discharge Instructions:        Disposition:  home  Diet: Cardiac Diet  Activity: As tolerated  Discharge Code Status: Full Code Roswell Nickel Lysbeth Penner, MD  [address]  [place] [address]  [phone]    Go to  11:30am 12/21 Friday.      Discharge Medications:                                                                      Discharge Medication List      Taking    carvedilol 25 MG tablet  Dose:  25 mg  Commonly known as:  COREG  Take 1 tablet (25 mg total) by mouth every 12 (twelve) hours.     enoxaparin 100 MG/ML Soln  Dose:  1 mg/kg  Commonly known as:  LOVENOX  Inject 0.93 mLs (93 mg total) into the skin every 12 (twelve) hours.for 15 days     furosemide 40 MG tablet  Dose:  40 mg  Commonly known as:  LASIX  Start taking on:  04/16/2017  Take 1 tablet (40 mg total) by mouth daily.     lisinopril 5 MG tablet  Dose:  5 mg  What changed:   medication  strength   how much to take  Commonly known as:  PRINIVIL,ZESTRIL  Take 1 tablet (5 mg total) by mouth daily.     omeprazole 40 MG capsule  Dose:  40 mg  Commonly known as:  PriLOSEC  Take 40 mg by mouth daily.     vitamin D 1000 UNIT tablet  Dose:  2000 Units  Commonly known as:  cholecalciferol  Take 2,000 Units by mouth daily.     warfarin 5 MG tablet  Dose:  5 mg  Commonly known as:  COUMADIN  Take 1 tablet (5 mg total) by mouth daily.for 10 days        STOP taking these medications    amLODIPine 5 MG tablet  Commonly known as:  NORVASC     atenolol 25 MG tablet  Commonly known as:  TENORMIN     lansoprazole 30 MG capsule  Commonly known as:  PREVACID     metoprolol succinate XL 25 MG 24 hr tablet  Commonly known as:  TOPROL-XL           Treatment Team       Treatment Team:   Attending Provider: Gailen Shelter, MD  Consulting Physician: Gailen Shelter, MD  Consulting Physician: Harlin Rain, MD   Procedures/Radiology performed:      XR CHEST 2 VIEWS  ECHOCARDIOGRAM ADULT TRANSESOPHAGEAL W/CLR/DOPP WAVEFORM     Discharge Day Exam (04/15/2017):   Physical  Exam Temp:  [97.5 F (36.4 C)-98.2 F (36.8 C)] 98.1 F (36.7 C)  Heart Rate:  [83-122] 122  Resp Rate:  [14-18] 17  BP: (124-185)/(80-129) 142/107  Wt Readings from Last 1 Encounters:   04/14/17 91.2 kg (201 lb 1.6 oz)      RECENT LABS (from the last 7 days)      Recent Labs  Lab 04/15/17  0416 04/14/17  0442   WBC 9.2 11.2*   RBC 4.81 4.61   Hemoglobin 14.9 14.9   Hematocrit 45.1 42.8   MCV 94 93   PLT CT 222 235       Recent Labs  Lab 04/15/17  0416 04/14/17  0442 04/13/17  0339   PT 11.2 11.7* 10.8   PT INR 1.1 1.2 1.1       Recent Labs  Lab 04/13/17  0632 04/13/17  0339 04/13/17  0050   Troponin I 0.03* 0.03* 0.04*     No results found for: [id number]   Recent Labs  Lab 04/15/17  0416 04/14/17  0442 04/13/17  1409   Glucose 114* 120* 95   Sodium 137 138 138   Potassium 4.3 3.9 4.1   Chloride 104 106 106   CO2 25.2 23.3 21.2   BUN 25* 20 19   Creatinine 1.51* 1.36* 1.38*   EGFR 46* 52* 51*   Calcium 9.2 9.0 9.3       Recent Labs  Lab 04/15/17  0416 04/14/17  0442 04/13/17  0339   Magnesium 2.1 1.9 2.2   Phosphorus  --   --  3.0   Albumin  --   --  3.7   Protein, Total  --   --  7.0   Bilirubin, Total  --   --  1.0   Alkaline Phosphatase  --   --  102   ALT  --   --  62*   AST (SGOT)  --   --  33           Invalid input(s):  AMORPHOUSUA   Allergies:      Codeine   Time spent on discharging the patient:  90 minutes   MRN: [id number]  CSN: [id number]  DOB: Feb 12, 1946   Gailen Shelter, MD  04/15/17 10:36 AM  [facility] Pager  -  [phone]  [facility] Mobile  -  [phone] [facility]  [address]  [place], [place]  [phone]

## 2017-04-15 NOTE — Progress Notes (Signed)
Patient was made aware of the importance of adherence to Coumadin, he voiced understanding and verbally demonstrated how to self administer. He did not want to stay another day because he "had work to do" and stated that if he agreed to be compliant with his medication that he should be released today. His Lovenox prescription was filled at [facility] Patient will pick up when ride gets here.

## 2017-04-17 ENCOUNTER — Telehealth: Payer: Self-pay | Admitting: Cardiovascular Disease

## 2017-04-17 NOTE — Telephone Encounter (Signed)
TRIED TO CONTACT PT TO NOTIFY THEM OF THEIR UPCOMING APPT'S THEY ARE SCHEDULED FOR [facility] F/U AND STRESS TEST ON 01/21 AND SCHEDULED FOR A TEE ON 06/13/17 @8 :30

## 2017-05-05 ENCOUNTER — Ambulatory Visit
Admission: RE | Admit: 2017-05-05 | Discharge: 2017-05-05 | Disposition: A | Discharge status: Home / Self Care | Payer: Medicare PPO | Source: Ambulatory Visit | Attending: Internal Medicine | Admitting: Internal Medicine

## 2017-05-05 ENCOUNTER — Inpatient Hospital Stay: Admission: RE | Admit: 2017-05-05 | Payer: Medicare PPO | Source: Ambulatory Visit

## 2017-05-05 DIAGNOSIS — Z7901 Long term (current) use of anticoagulants: Secondary | ICD-10-CM | POA: Insufficient documentation

## 2017-05-05 LAB — PT/INR
PT INR: 1 (ref 0.5–1.3)
PT: 10.4 s (ref 9.5–11.5)

## 2017-05-12 ENCOUNTER — Telehealth: Payer: Self-pay

## 2017-05-12 NOTE — Telephone Encounter (Signed)
PCP was contacted for recent office note and labs, Cardiac Testing with Tracing

## 2017-05-12 NOTE — Telephone Encounter (Signed)
REC'D OV NOTES AND LABS FROM INT MED CONSULT

## 2017-05-19 ENCOUNTER — Inpatient Hospital Stay: Payer: Self-pay | Admitting: Family

## 2017-05-29 ENCOUNTER — Inpatient Hospital Stay: Payer: Self-pay | Admitting: Family

## 2017-06-01 ENCOUNTER — Emergency Department
Admission: EM | Admit: 2017-06-01 | Discharge: 2017-06-01 | Disposition: A | Discharge status: Home / Self Care | Payer: Medicare PPO | Attending: Emergency Medicine | Admitting: Emergency Medicine

## 2017-06-01 ENCOUNTER — Emergency Department: Payer: Medicare PPO

## 2017-06-01 ENCOUNTER — Encounter: Payer: Self-pay | Admitting: Emergency Medicine

## 2017-06-01 DIAGNOSIS — R0602 Shortness of breath: Secondary | ICD-10-CM | POA: Insufficient documentation

## 2017-06-01 DIAGNOSIS — I5021 Acute systolic (congestive) heart failure: Secondary | ICD-10-CM | POA: Insufficient documentation

## 2017-06-01 DIAGNOSIS — I110 Hypertensive heart disease with heart failure: Secondary | ICD-10-CM | POA: Insufficient documentation

## 2017-06-01 DIAGNOSIS — I4891 Unspecified atrial fibrillation: Secondary | ICD-10-CM | POA: Insufficient documentation

## 2017-06-01 DIAGNOSIS — I10 Essential (primary) hypertension: Secondary | ICD-10-CM

## 2017-06-01 DIAGNOSIS — W1789XA Other fall from one level to another, initial encounter: Secondary | ICD-10-CM | POA: Insufficient documentation

## 2017-06-01 LAB — CBC AND DIFFERENTIAL
Basophils %: 0 % (ref 0.0–3.0)
Basophils Absolute: 0 10*3/uL (ref 0.0–0.3)
Eosinophils %: 2 % (ref 0.0–7.0)
Eosinophils Absolute: 0.3 10*3/uL (ref 0.0–0.8)
Hematocrit: 46.6 % (ref 39.0–52.5)
Hemoglobin: 15 gm/dL (ref 13.0–17.5)
Lymphocytes Absolute: 2.6 10*3/uL (ref 0.6–5.1)
Lymphocytes: 16 % (ref 15.0–46.0)
MCH: 31 pg (ref 28–35)
MCHC: 32 gm/dL (ref 32–36)
MCV: 96 fL (ref 80–100)
MPV: 7.2 fL (ref 6.0–10.0)
Monocytes Absolute: 1 10*3/uL (ref 0.1–1.7)
Monocytes: 6 % (ref 3.0–15.0)
Neutrophils %: 76 % (ref 42.0–78.0)
Neutrophils Absolute: 12.3 10*3/uL — ABNORMAL HIGH (ref 1.7–8.6)
PLT CT: 265 10*3/uL (ref 130–440)
RBC: 4.86 10*6/uL (ref 4.00–5.70)
RDW: 13.7 % (ref 11.0–14.0)
WBC: 16.2 10*3/uL — ABNORMAL HIGH (ref 4.0–11.0)

## 2017-06-01 LAB — TROPONIN I: Troponin I: 0.04 ng/mL — ABNORMAL HIGH (ref 0.00–0.02)

## 2017-06-01 LAB — BASIC METABOLIC PANEL
Anion Gap: 10.2 mMol/L (ref 7.0–18.0)
BUN / Creatinine Ratio: 13.1 Ratio (ref 10.0–30.0)
BUN: 17 mg/dL (ref 7–22)
CO2: 27 mMol/L (ref 20.0–30.0)
Calcium: 8.9 mg/dL (ref 8.5–10.5)
Chloride: 104 mMol/L (ref 98–110)
Creatinine: 1.3 mg/dL (ref 0.80–1.30)
EGFR: 55 mL/min/{1.73_m2} — ABNORMAL LOW (ref 60–150)
Glucose: 117 mg/dL — ABNORMAL HIGH (ref 71–99)
Osmolality Calc: 276 mOsm/kg (ref 275–300)
Potassium: 4.2 mMol/L (ref 3.5–5.3)
Sodium: 137 mMol/L (ref 136–147)

## 2017-06-01 LAB — VH I-STAT INR NOTIFICATION

## 2017-06-01 LAB — VH I-STAT INR: i-STAT INR: 1.9 (ref 0.0–3.0)

## 2017-06-01 MED ORDER — VH NITROGLYCERIN IN D5W 200-5 MCG/ML-% IV SOLN
5.00 ug/min | INTRAVENOUS | Status: DC
Start: 2017-06-01 — End: 2017-06-01
  Administered 2017-06-01: 06:00:00 20 ug/min via INTRAVENOUS

## 2017-06-01 MED ORDER — LABETALOL HCL 200 MG PO TABS
100.00 mg | ORAL_TABLET | Freq: Once | ORAL | Status: AC
Start: 2017-06-01 — End: 2017-06-01
  Administered 2017-06-01: 09:00:00 100 mg via ORAL
  Filled 2017-06-01 (×2): qty 1

## 2017-06-01 MED ORDER — FUROSEMIDE 10 MG/ML IJ SOLN
40.00 mg | Freq: Once | INTRAMUSCULAR | Status: AC
Start: 2017-06-01 — End: 2017-06-01
  Administered 2017-06-01: 06:00:00 40 mg via INTRAVENOUS

## 2017-06-01 MED ORDER — FUROSEMIDE 20 MG PO TABS
20.00 mg | ORAL_TABLET | Freq: Two times a day (BID) | ORAL | 0 refills | Status: DC
Start: 2017-06-01 — End: 2017-07-03

## 2017-06-01 MED ORDER — FUROSEMIDE 10 MG/ML IJ SOLN
INTRAMUSCULAR | Status: AC
Start: 2017-06-01 — End: ?
  Filled 2017-06-01: qty 4

## 2017-06-01 MED ORDER — METOPROLOL TARTRATE 5 MG/5ML IV SOLN
5.0000 mg | INTRAVENOUS | Status: DC | PRN
Start: 2017-06-01 — End: 2017-06-01
  Administered 2017-06-01: 08:00:00 5 mg via INTRAVENOUS

## 2017-06-01 MED ORDER — LABETALOL HCL 100 MG PO TABS
100.00 mg | ORAL_TABLET | Freq: Two times a day (BID) | ORAL | 0 refills | Status: AC
Start: 2017-06-01 — End: 2017-06-11

## 2017-06-01 MED ORDER — METOPROLOL TARTRATE 5 MG/5ML IV SOLN
INTRAVENOUS | Status: AC
Start: 2017-06-01 — End: ?
  Filled 2017-06-01: qty 5

## 2017-06-01 MED ORDER — NITROGLYCERIN IN D5W 200-5 MCG/ML-% IV SOLN
INTRAVENOUS | Status: AC
Start: 2017-06-01 — End: ?
  Filled 2017-06-01: qty 250

## 2017-06-01 MED ORDER — POTASSIUM CHLORIDE CRYS ER 20 MEQ PO TBCR
20.00 meq | EXTENDED_RELEASE_TABLET | Freq: Two times a day (BID) | ORAL | 0 refills | Status: AC
Start: 2017-06-01 — End: 2017-06-06

## 2017-06-01 NOTE — ED Provider Notes (Signed)
[facility] EMERGENCY DEPARTMENT   History and Physical Exam      Patient Name: Christopher Dickerson, Christopher SR.  Encounter Date:  06/01/2017  Attending Physician: Dimple Casey, MD  PCP: Gayla Medicus, MD  Patient DOB:  11-19-1945  MRN:  [id number]  Room:  N10/NA10-A        Diagnosis / Disposition     Clinical Impression  1. Acute systolic congestive heart failure    2. Hypertension, unspecified type    3. Atrial fibrillation with RVR        Disposition  ED Disposition     ED Disposition Condition Date/Time Comment    Discharge  Sun Jun 01, 2017 11:29 AM Christopher Fenton Sr. discharge to home/self care.    Condition at disposition: Stable            History of Presenting Illness     Chief complaint: Shortness of Breath    HPI/ROS is limited by: none  HPI/ROS given by: patient    Provocative: lying down  Pallative Factors: none  Quality: soa  Region: chest  Radiation: none  Severity: moderate  Duration/Temporal Factors: last couple of days worse last night      Christopher Golob Sr. is a 72 y.o. male who presents with progressive soa worsening esp on lying flat. Notes feels tight. No fevers. Has some residual left sided cp from a fall down an elevator shaft, had cxr that reportedly did not show a rib fx.       Review of Systems   Review of Systems   Constitutional: Negative.    HENT: Negative for congestion and sore throat.    Respiratory: Positive for cough and shortness of breath. Negative for wheezing.    Cardiovascular: Positive for palpitations. Negative for chest pain, claudication and leg swelling.   Gastrointestinal: Negative for abdominal pain, constipation, nausea and vomiting.   Genitourinary: Negative for dysuria, frequency and hematuria.   Musculoskeletal: Negative for back pain and neck pain.   Skin: Negative.    Neurological: Negative for dizziness, sensory change, focal weakness, seizures and headaches.   Endo/Heme/Allergies: Negative for polydipsia.       Allergies     Pt is allergic to  codeine.    Medications       Current Facility-Administered Medications:   .  metoprolol tartrate (LOPRESSOR) injection 5 mg, 5 mg, Intravenous, Q15 Min PRN, Dimple Casey, MD, 5 mg at 06/01/17 0734  .  nitroglycerin (TRIDIL) 50 mg in dextrose 5% 250 mL infusion (premix), 5-100 mcg/min, Intravenous, Continuous, Dimple Casey, MD, Stopped at 06/01/17 [phone]    Current Outpatient Prescriptions:   .  Cholecalciferol (VITAMIN D3) 400 units Cap, Take by mouth., Disp: , Rfl:   .  omeprazole (PRILOSEC) 40 MG capsule, Take 40 mg by mouth daily.  , Disp: , Rfl:   .  furosemide (LASIX) 20 MG tablet, Take 1 tablet (20 mg total) by mouth 2 (two) times daily., Disp: 30 tablet, Rfl: 0  .  labetalol (NORMODYNE) 100 MG tablet, Take 1 tablet (100 mg total) by mouth 2 (two) times daily.for 10 days, Disp: 20 tablet, Rfl: 0  .  metoprolol succinate XL (TOPROL-XL) 25 MG 24 hr tablet, metoprolol succinate ER 25 mg tablet,extended release 24 hr  TAKE 1 TABLET BY MOUTH EVERY NIGHT AT BEDTIME, Disp: , Rfl:   .  potassium chloride (K-DUR,KLOR-CON) 20 MEQ tablet, Take 1 tablet (20 mEq total) by mouth 2 (two) times daily.for 5 days,  Disp: 10 tablet, Rfl: 0  .  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    Past Surgical History     Pt has a past surgical history that includes LIFT, ARM (MEDICAL); Knee arthroscopy w/ ACL reconstruction; Appendectomy; COLONOSCOPY, POLYPECTOMY (12/16/2012); and EGD, BIOPSY (12/16/2012).    Family History     The family history is not on file.    Social History     Pt reports that he has been smoking Cigarettes.  He has been smoking about 0.50 packs per day. He has never used smokeless tobacco. He reports that he drinks alcohol. He reports that he does not use  drugs.    Physical Exam     Blood pressure 132/74, pulse 92, temperature 98.4 F (36.9 C), temperature source Oral, resp. rate 16, SpO2 97 %.    Physical Exam   Constitutional: He is oriented to person, place, and time. He appears well-developed and well-nourished. No distress.   HENT:   Head: Atraumatic.   Mouth/Throat: Oropharynx is clear and moist.   Eyes: Pupils are equal, round, and reactive to light. Conjunctivae are normal.   Neck: Neck supple. JVD present.   Cardiovascular: Normal heart sounds.    No murmur heard.  Pulmonary/Chest: Effort normal. No respiratory distress. He has wheezes. He has rales.   Abdominal: Soft. There is no tenderness.   Musculoskeletal: Normal range of motion. He exhibits edema. He exhibits no tenderness.   +1 LE lymphedema slightly more left than right   Neurological: He is alert and oriented to person, place, and time. Coordination normal.   Skin: Skin is warm. He is not diaphoretic.   Psychiatric: He has a normal mood and affect. Thought content normal.   Nursing note and vitals reviewed.      Orders Placed     Orders Placed This Encounter   Procedures   . XR Chest AP Portable   . Basic Metabolic Panel   . CBC   . I-Stat INR   . Troponin I (Stat)   . I-Stat INR   . Diet cardiac   . Cardiac Monitoring (Hard Wire)   . ECG 12 lead (Specify reason for exam)   . Saline lock IV       Diagnostic Results       The results of the diagnostic studies below have been reviewed by myself:    Labs  Results     Procedure Component Value Units Date/Time    CBC [[id number]]  (Abnormal) Collected:  06/01/17 0605    Specimen:  Blood from Blood Updated:  06/01/17 0808     WBC 16.2 (H) K/cmm      RBC 4.86 M/cmm      Hemoglobin 15.0 gm/dL      Hematocrit 40.9 %      MCV 96 fL      MCH 31 pg      MCHC 32 gm/dL      RDW 81.1 %      PLT CT 265 K/cmm      MPV 7.2 fL      NEUTROPHIL % 76.0 %      Lymphocytes 16.0 %      Monocytes 6.0 %      Eosinophils % 2.0 %  Basophils % 0.0 %      Neutrophils Absolute  12.3 (H) K/cmm      Lymphocytes Absolute 2.6 K/cmm      Monocytes Absolute 1.0 K/cmm      Eosinophils Absolute 0.3 K/cmm      BASO Absolute 0.0 K/cmm      RBC Morphology RBC Morphology Reviewed Morphology Consistent with Hemogram    Troponin I (Stat) [[id number]]  (Abnormal) Collected:  06/01/17 0605    Specimen:  Plasma Updated:  06/01/17 0708     Troponin I 0.04 (H) ng/mL     Basic Metabolic Panel [[id number]]  (Abnormal) Collected:  06/01/17 0605    Specimen:  Plasma Updated:  06/01/17 0705     Sodium 137 mMol/L      Potassium 4.2 mMol/L      Chloride 104 mMol/L      CO2 27.0 mMol/L      Calcium 8.9 mg/dL      Glucose 782 (H) mg/dL      Creatinine 9.56 mg/dL      BUN 17 mg/dL      Anion Gap 21.3 mMol/L      BUN/Creatinine Ratio 13.1 Ratio      EGFR 55 (L) mL/min/1.21m2      Osmolality Calc 276 mOsm/kg     I-Stat INR [[id number]] Collected:  06/01/17 0626    Specimen:  Blood Updated:  06/01/17 0630     i-STAT INR 1.9    I-Stat INR [[id number]] Collected:  06/01/17 2440    Specimen:  ISTAT Updated:  06/01/17 0627     I-STAT Notification Istat Notification          Radiologic Studies  Radiology Results (24 Hour)     Procedure Component Value Units Date/Time    XR Chest AP Portable [[id number]] Collected:  06/01/17 4403    Order Status:  Completed Updated:  06/01/17 4742    Narrative:       HISTORY:Patient reports difficulty breathing for 4 days ago after a fall. History of a-fib, COPD, cardiomyopathy, and hypertension.     COMPARISON: April 13, 2017    TECHNIQUE: XR CHEST AP PORTABLE     FINDINGS:    Lines and tubes: None    Lungs and hila: There is pulmonary vascular congestion. There is no pleural effusion. There is no pneumothorax.    Heart and Mediastinum: The cardiac silhouette is enlarged and stable in size.     Bones and soft tissues: No acute abnormality.    Upper abdomen: Normal      Impression:       Stable cardiomegaly with pulmonary vascular congestion.            ReadingStation:[id number]          EKG:  afib rvr 136 c lat st dep c/w lvh    Procedures     pocus showing b lines and what appears to be cardiomyopathy with ef visually estimated around 15-20%  ED Course     Blood pressure 132/74, pulse 92, temperature 98.4 F (36.9 C), temperature source Oral, resp. rate 16, SpO2 97 %.    I reviewed the vital signs, nursing notes, past medical history, past surgical history, family history and social history.   I have reviewed the patient's previous charts.     O2 sat- saturation: normal 97% while not having brief apneic spells when falls asleep (then drops to 92%)  Patient verbalized understanding and was agreeable to plan.     ED Course  as of Jun 01 1128   Sun Jun 01, 2017   0624 Xr chest no ptx, infil, effusion noted  [GM]   0628 Last tee 12/18 had ef 10-15% and mobile thrombus is in LEFT atrial appendage as opposed to the right  [GM]   0631 i-STAT INR: 1.9 [GM]   0713 Bmp  unremarkable, mild trop elevation has urinated out just over 300 ml and sbp's now in 140's c hr now down to 100's (105-110).   [GM]   0744 Hr 90's to 110's now   [GM]   0802 1200 cc uop at this point, hr 90's, sbp 150's and pt continuing improvement  [GM]   0832 WBC: (!) 16.2 [GM]   0927 Pt very comfortable bp 129 currently so decreased ntg gtt and bp stable 144/79  [GM]   1001 Pt remains improved with notable improvement in vs  [GM]   1001 Pt does not have an established cardiologist yet  [GM]   1129 Remains stable with stable hr, bp and no oxygen req't. D/w and arranged close o/p f/u with cards  [GM]      ED Course User Index  [GM] Dimple Casey, MD           Medications Given in ED:  N10-NA10-A - MAR ACTION REPORT  (last 24 hrs)         COMEGYS, HEATHER L, RN       Medication Name Action Time Action Site Route Rate Dose Reason Comments User     furosemide (LASIX) injection 40 mg 06/01/17 8119 Given  Intravenous  40 mg   Comegys, Heather L, RN     nitroglycerin (TRIDIL) 50 mg in dextrose 5% 250 mL infusion (premix) 06/01/17 0614 New Bag   Intravenous 6 mL/hr 20 mcg/min   Comegys, Heather L, RN     nitroglycerin (TRIDIL) 50 mg in dextrose 5% 250 mL infusion (premix) 06/01/17 0621 Rate/Dose Change  Intravenous 12 mL/hr 40 mcg/min   Comegys, Heather L, RN     nitroglycerin (TRIDIL) 50 mg in dextrose 5% 250 mL infusion (premix) 06/01/17 0630 Rate/Dose Change  Intravenous 18 mL/hr 60 mcg/min  increase to 60 mcg/min per MD Comegys, Heather L, RN     nitroglycerin (TRIDIL) 50 mg in dextrose 5% 250 mL infusion (premix) 06/01/17 0636 Rate/Dose Change  Intravenous 24 mL/hr 80 mcg/min   Comegys, Heather L, RN     nitroglycerin (TRIDIL) 50 mg in dextrose 5% 250 mL infusion (premix) 06/01/17 0640 Rate/Dose Change  Intravenous 30 mL/hr 100 mcg/min   Comegys, Heather L, RN          LAM, JOSHUA, RN       Medication Name Action Time Action Site Route Rate Dose Reason Comments User     labetalol (NORMODYNE) tablet 100 mg 06/01/17 0844 Given  Oral  100 mg   Rowe Robert, RN     metoprolol tartrate (LOPRESSOR) injection 5 mg 06/01/17 1478 Given  Intravenous  5 mg   Rowe Robert, RN     nitroglycerin (TRIDIL) 50 mg in dextrose 5% 250 mL infusion (premix) 06/01/17 0805 No Dual Sign - Rate/Dose Change  Intravenous 24 mL/hr 80 mcg/min   Rowe Robert, RN     nitroglycerin (TRIDIL) 50 mg in dextrose 5% 250 mL infusion (premix) 06/01/17 0854 Rate/Dose Change  Intravenous 30 mL/hr 100 mcg/min   Rowe Robert, RN     nitroglycerin (TRIDIL) 50 mg in dextrose 5% 250 mL infusion (premix) 06/01/17 0926 Rate/Dose  Change  Intravenous 12 mL/hr 40 mcg/min   Rowe Robert, RN     nitroglycerin (TRIDIL) 50 mg in dextrose 5% 250 mL infusion (premix) 06/01/17 0944 Stopped  Intravenous     Rowe Robert, RN                Prescriptions:  New Prescriptions    FUROSEMIDE (LASIX) 20 MG TABLET    Take 1 tablet (20 mg total) by mouth 2 (two) times daily.    LABETALOL (NORMODYNE) 100 MG TABLET    Take 1 tablet (100 mg total) by mouth 2 (two) times daily.for 10 days    POTASSIUM CHLORIDE (K-DUR,KLOR-CON) 20  MEQ TABLET    Take 1 tablet (20 mEq total) by mouth 2 (two) times daily.for 5 days         Follow-up Information     Call Brayton Caves, MD.    Specialties:  Interventional Cardiology, Cardiology  Contact information:  [address]  [place] [address]  [phone]             [facility] Emergency Department.    Specialty:  Emergency Medicine  Why:  If symptoms worsen  Contact information:  [address]  [place] [address]  [phone]                   MDM / Critical Care     The patient presented with decompensated chf and has been stabilized through intensive ed treatment and is clinically well appearing at this time. Symptoms are not suggestive of pulmonary embolus, cardiac ischemia, aortic dissection, or other serious etiology. These diagnoses have been considered and excluded clinically.  Given the low risk of these diagnoses further testing and evaluation does not appear to be indicated at this time. Diagnostic impression and plan were discussed and agreed upon with the patient and/or family.  Results of lab/radiology tests were reviewed and discussed with the patient and/or family. All questions were answered and concerns addressed. Chest pain precautions were given. I emphasized the need for close follow-up with the patient's primary care physician, and that should the symptoms worsen or change in anyway that they are to return to the ER immediately for re-evaluation.    30-74 min (independed of any procedures performed)        Dimple Casey, MD  6:08 AM      This chart was generated by an EMR and may contain errors, including typographical, or omissions not intended by the user.      Dimple Casey, MD  06/01/17 1131

## 2017-06-01 NOTE — ED Notes (Signed)
tridil gtt at  177mcg/min

## 2017-06-01 NOTE — ED Notes (Signed)
tridil gtt stopped , will evaluate for change in bp

## 2017-06-01 NOTE — ED Notes (Signed)
Pt was satting 91% on room air, this RN and another RN placed pt on 2 L n/c. Notified MD who then stated to take oxygen off for now. Pt sats going from 91-99% but states that he feels he's not getting enough air

## 2017-06-01 NOTE — ED Notes (Signed)
Wife is running home to get med list at this time

## 2017-06-01 NOTE — ED Notes (Signed)
Bed: NA10-A  Expected date:   Expected time:   Means of arrival:   Comments:  EMS ETA [phone]

## 2017-06-03 LAB — ECG 12-LEAD
Patient Age: 71 years
Q-T Interval(Corrected): 467 ms
Q-T Interval: 310 ms
QRS Axis: 57 deg
QRS Duration: 103 ms
T Axis: 152 years
Ventricular Rate: 136 //min

## 2017-06-04 ENCOUNTER — Encounter (INDEPENDENT_AMBULATORY_CARE_PROVIDER_SITE_OTHER): Payer: Self-pay | Admitting: Cardiovascular Disease

## 2017-06-04 NOTE — H&P (Signed)
[facility] [place]  [address]  [address]  [place] [address]  [phone]    Date: 06/06/2017  Patient Name: Gregory Ochoa  MRN#: [id number]  DOB: Nov 27, 1945    Provider: Lenda Kelp, MD  PCP: Gayla Medicus, MD      Reason for visit: Other Primary Cardiomyopathies; Hypertension; and Arrhythmia    History:     Gregory Ochoa is a 72 y.o. male who is here to establish care. He has a history of Afib and CHF/cardiomyopathy.     He has not seen a cardiologist as an outpatient before.  He was seen in [facility] by [facility] previously.  He reports he has had A Fib for years but is unsure how and who has been managing it.  Today he reports that he can feel his Afib and can feel his heart racing at times, moreso recently. He has had worsening SOB for the last month. He has difficulty sleeping, stating that he only gets 1-2 hours of sleep per night, but denies frank orthopnea/PND.  He does not weigh himself.  He has occasional LE edema. He has been taking her Coumadin, but no one has been checking his INRs. No c/o any CP, syncope/presyncope, or claudication. He is not currently working but used to be in [employer].  He smokes but drinks very little alcohol per his report.     Past Medical History:     Past Medical History:   Diagnosis Date   . A-fib (CMS HCC)    . Arthritis    . Cardiomyopathy (CMS HCC)    . CHF (congestive heart failure) (CMS HCC)    . COPD (chronic obstructive pulmonary disease) (CMS HCC)    . Hypertension        Past Surgical History:     Past Surgical History:   Procedure Laterality Date   . COLONOSCOPY W/ POLYPECTOMY     . GASTROSCOPY     . HX APPENDECTOMY     . KNEE ARTHROSCOPY       Allergies:     Allergies   Allergen Reactions   . Codeine        Medications:     Current Outpatient Medications   Medication Sig   . Cholecalciferol (VITAMIN D3) 400 unit Oral Capsule Take 400 Units by mouth Once a day   . furosemide (LASIX) 20 mg Oral Tablet Take 20 mg by mouth Twice daily    . lisinopril (PRINIVIL) 40 mg Oral Tablet Take 40 mg by mouth Once a day   . metoprolol succinate (TOPROL-XL) 50 mg Oral Tablet Sustained Release 24 hr Take 1 Tab (50 mg total) by mouth Once a day   . omeprazole (PRILOSEC) 40 mg Oral Capsule, Delayed Release(E.C.) Take 40 mg by mouth Once a day   . spironolactone (ALDACTONE) 25 mg Oral Tablet Take 1 Tab (25 mg total) by mouth Every morning with breakfast   . warfarin (COUMADIN) 5 mg Oral Tablet Take 5 mg by mouth Once a day       Family History:   Denies FH of cardiac issues    Social History:     Social History     Socioeconomic History   . Marital status: Married     Spouse name: Not on file   . Number of children: Not on file   . Years of education: Not on file   . Highest education level: Not on file   Social Needs   .  Financial resource strain: Not on file   . Food insecurity - worry: Not on file   . Food insecurity - inability: Not on file   . Transportation needs - medical: Not on file   . Transportation needs - non-medical: Not on file   Occupational History   . Not on file   Tobacco Use   . Smoking status: Current Every Day Smoker     Packs/day: 1.00     Types: Cigarettes   . Smokeless tobacco: Never Used   Substance and Sexual Activity   . Alcohol use: Not Currently   . Drug use: Never   . Sexual activity: Not on file   Other Topics Concern   . Not on file   Social History Narrative   . Not on file       Review of Systems:     All other systems were reviewed and are negative other than noted in the HPI.  No bleeding issues.    Physical Exam:     Vitals:    06/06/17 0819   BP: (!) 134/99   Pulse: (!) 136   Resp: 18   SpO2: 98%   Weight: 96.2 kg (212 lb)   Height: 1.829 m (6')   BMI: 28.81         Body mass index is 28.75 kg/m.    General: No acute distress, overweight  HEENT: Moist mucous membranes  Neck: Supple, mild JVD, no bruits  Heart/Cardiovascular:  Tachy, irregular  No murmurs, rubs, or gallops  2+ radial pulses  Respiratory/Lungs: Non-labored,  mild ronchi  Abdominal/Gastrointestinal: Soft, non-tender  Extremities: Warm and well-perfused, trace ankle edema  Skin: No rash  Neurological: Alert and oriented x3, grossly nonfocal  Psych: Normal affect    Cardiovascular Workup:     Abd [place] 03/23/15  Abdominal aorta and IVC unremarkable    Echo 04/14/17  Global left ventricular systolic function is severely decreased. Estimated EF 10-15%. Global hypokinesis with base contracting little better.   The right ventricular systolic function is mildly decreased.  The left atrium is moderately enlarged. There is spontaneous echo contrast present in the left atrial cavity.  Mobile thrombus present in the left atrial appendage. Spontaneous echo contrast seen in the left atrial appendage.    Assessment and Plan:     Gregory Ochoa is a 72 y.o. male with    Chronic LV systolic CHF/cardiomyopathy: Echo 04/14/17 EF 10-15%.   - possibly tachy-induced; will need ischemia evaluation eventually; no CP  - continue Lasix bid  - start Aldactone 25 mg daily  - continue ACEI; increase Toprol to 50 mg daily; d/c Labetalol  - advised to monitor his weight at home and advised to call if his weight increases >2-3 lbs in one day   - repeat echo after A Fib treated as below    Afib:   - CHADS-Vasc 3  - was in Afib with RVR during his 04/13/17 admission; a DCCV was aborted due to a mobile thrombus present in Lt atrial appendage on 04/14/17 Echo  - on Coumadin for stroke prophylaxis but no one is checking his INRs; last INR 1.9 at [facility]; reports that other NOACs were cost prohibitive at recent hospitalization  - increase Metoprolol to 50 mg QD for better rate control  - will refer to Coumadin clinic for INR management  - will plan to conduct a DCCV once he is anticoagulated with INR >2 for 4 weeks    HTN borderline elevated today   -  med changes as above    Lipid status: no recent FLP on file  - not on a statin; encouraged a heart healthy diet and regular exercise    Tobacco use:  currently smoking  - strongly encouraged smoking cessation    Thank you for allowing me to participate in the care of your patient. Please feel free to contact me if there are further questions.     I am scribing for, and in the presence of Dr. Lenda Kelpavid C Jarom Govan, MD for services provided on 06/06/2017.  [address] Hood, SCRIBE     [place], [place]  06/06/2017, 08:54    I personally performed the services described in this documentation, as scribed in my presence, and it is both accurate and complete.    Lenda Kelpavid C Hoke Baer, MD    Lenda Kelpavid C Knute Mazzuca, MD  06/06/2017, 09:36

## 2017-06-06 ENCOUNTER — Encounter (INDEPENDENT_AMBULATORY_CARE_PROVIDER_SITE_OTHER): Payer: Self-pay | Admitting: Cardiovascular Disease

## 2017-06-06 ENCOUNTER — Ambulatory Visit (INDEPENDENT_AMBULATORY_CARE_PROVIDER_SITE_OTHER): Payer: Commercial Managed Care - PPO | Admitting: Cardiovascular Disease

## 2017-06-06 VITALS — BP 134/99 | HR 136 | Resp 18 | Ht 72.0 in | Wt 212.0 lb

## 2017-06-06 DIAGNOSIS — Z5181 Encounter for therapeutic drug level monitoring: Secondary | ICD-10-CM

## 2017-06-06 DIAGNOSIS — I5022 Chronic systolic (congestive) heart failure: Secondary | ICD-10-CM

## 2017-06-06 DIAGNOSIS — Z7689 Persons encountering health services in other specified circumstances: Secondary | ICD-10-CM

## 2017-06-06 DIAGNOSIS — I4891 Unspecified atrial fibrillation: Secondary | ICD-10-CM

## 2017-06-06 DIAGNOSIS — I509 Heart failure, unspecified: Secondary | ICD-10-CM

## 2017-06-06 DIAGNOSIS — I429 Cardiomyopathy, unspecified: Principal | ICD-10-CM

## 2017-06-06 DIAGNOSIS — I10 Essential (primary) hypertension: Secondary | ICD-10-CM

## 2017-06-06 DIAGNOSIS — Z6828 Body mass index (BMI) 28.0-28.9, adult: Secondary | ICD-10-CM

## 2017-06-06 DIAGNOSIS — Z7901 Long term (current) use of anticoagulants: Secondary | ICD-10-CM

## 2017-06-06 MED ORDER — SPIRONOLACTONE 25 MG TABLET
25.00 mg | ORAL_TABLET | Freq: Every morning | ORAL | 0 refills | Status: AC
Start: 2017-06-06 — End: ?

## 2017-06-06 MED ORDER — METOPROLOL SUCCINATE ER 50 MG TABLET,EXTENDED RELEASE 24 HR
50.0000 mg | ORAL_TABLET | Freq: Every day | ORAL | 0 refills | Status: AC
Start: 2017-06-06 — End: ?

## 2017-06-06 NOTE — Patient Instructions (Addendum)
Stop Labetolol  Increase Metoprolol to 50 mg once a day   Start Aldactone 25 mg once a day

## 2017-06-11 ENCOUNTER — Telehealth (INDEPENDENT_AMBULATORY_CARE_PROVIDER_SITE_OTHER): Payer: Self-pay | Admitting: Cardiovascular Disease

## 2017-06-11 NOTE — Telephone Encounter (Signed)
FAXED ORDER, CLINICALS TO [facility]

## 2017-06-13 ENCOUNTER — Inpatient Hospital Stay
Admission: RE | Admit: 2017-06-13 | Discharge: 2017-06-13 | Disposition: A | Discharge status: Home / Self Care | Payer: Medicare PPO | Source: Ambulatory Visit | Attending: Cardiovascular Disease | Admitting: Cardiovascular Disease

## 2017-06-18 ENCOUNTER — Ambulatory Visit: Payer: Medicare PPO

## 2017-06-19 ENCOUNTER — Ambulatory Visit: Payer: Medicare PPO

## 2017-06-20 ENCOUNTER — Encounter (INDEPENDENT_AMBULATORY_CARE_PROVIDER_SITE_OTHER): Payer: Self-pay | Admitting: Nurse Practitioner

## 2017-06-24 ENCOUNTER — Ambulatory Visit
Admission: RE | Admit: 2017-06-24 | Discharge: 2017-06-24 | Disposition: A | Discharge status: Home / Self Care | Payer: Medicare PPO | Source: Ambulatory Visit | Attending: Cardiovascular Disease | Admitting: Cardiovascular Disease

## 2017-06-24 ENCOUNTER — Ambulatory Visit: Payer: Medicare PPO

## 2017-06-24 DIAGNOSIS — I513 Intracardiac thrombosis, not elsewhere classified: Secondary | ICD-10-CM

## 2017-06-24 NOTE — Progress Notes (Signed)
Patient seen by Dr. Monte Fantasia in hospital in December with LA thrombus, a fib with RVR.  He did not f/u at [facility] and was seen by Dr. Orvis Brill on 3/8, who advised 4 weeks of INR over 2 prior to TEE/CV.  He has not had INR checked states r/t weather.  Discussed with Dr. Orvis Brill and will have pt reschedule with him after INR therapeutic.        Christopher Coil Channing Mutters FNP

## 2017-06-25 ENCOUNTER — Ambulatory Visit: Payer: Medicare PPO | Attending: Cardiovascular Disease

## 2017-06-25 DIAGNOSIS — I482 Chronic atrial fibrillation: Secondary | ICD-10-CM | POA: Insufficient documentation

## 2017-06-25 DIAGNOSIS — Z9229 Personal history of other drug therapy: Secondary | ICD-10-CM

## 2017-06-25 DIAGNOSIS — Z7901 Long term (current) use of anticoagulants: Secondary | ICD-10-CM | POA: Insufficient documentation

## 2017-06-25 DIAGNOSIS — Z5181 Encounter for therapeutic drug level monitoring: Secondary | ICD-10-CM | POA: Insufficient documentation

## 2017-06-25 NOTE — Progress Notes (Signed)
Anticoagulation Progress Report   June 25, 2017  Name: Christopher Dickerson, Christopher Dickerson    Medical Record Number: [id number]  Reason for Treatment: Atrial fibrillation chronic     INR Range: 2.0-3.0    Date: 2017-06-25 15:56  Location: [facility]    INR: 1.30    Tablet Strength: 5 mg    Next INR: 5 days 2017-06-30    Update Note: 06/24/2017(Sally Smith): Called patient in regards to missed   appointment, states he thought appointment was for next week. Appointment   scheduled for tomorrow, 06/25/17.   06/19/2017(Sally Katrinka Blazing):   Appointment rescheduled due to weather, office closed.     06/17/2017(Darlene Mechele Collin): Patient returned phone call about r/s   appointment due to weather. He is on for 06/19/17 at 4:00 pm.     06/17/2017(Carla Adams): Left message to call and reschedule     06/11/2017(Brenda Hindman): Called the patient and scheduled his   first appt for 06/18/17 at 4:00.     Signature Note: Assessment by Hilario Quarry RN; dosage by Hilario Quarry RN     Dosage Out: :W-10mg :Th-7.5mg :F-5mg :Sa-5mg :Su-5mg :M-inr:

## 2017-06-25 NOTE — Progress Notes (Signed)
Anticoagulation Progress Report   June 25, 2017  Name: Christopher Dickerson, Christopher Dickerson    Medical Record Number: [id number]  Reason for Treatment: Atrial fibrillation chronic     INR Range: 2.0-3.0    Date: 2017-06-25 16:07  Location: [facility]    Update Note: Patient states he has been on Coumadin for years. He denies   missing any doses. He states he drinks alcohol occasionally. He states he   does not eat many greens and may incorporate these into his dietary intake,   this has been discussed. S/S of bleeding and blood clots reviewed. He was   advised to go to ER if needed within the next 5 days as we are giving one   time dose of 10 mg tonight and an extra 2.5 mg Coumadin tomorrow. Pill   dosing and color reviewed.     Submitted by Hilario Quarry RN

## 2017-06-26 LAB — VH I-STAT INR: i-STAT INR: 1.3 (ref 0.0–3.0)

## 2017-06-30 ENCOUNTER — Ambulatory Visit: Payer: Medicare PPO

## 2017-06-30 DIAGNOSIS — Z5181 Encounter for therapeutic drug level monitoring: Secondary | ICD-10-CM | POA: Insufficient documentation

## 2017-06-30 DIAGNOSIS — Z7901 Long term (current) use of anticoagulants: Secondary | ICD-10-CM | POA: Insufficient documentation

## 2017-06-30 DIAGNOSIS — I482 Chronic atrial fibrillation: Secondary | ICD-10-CM | POA: Insufficient documentation

## 2017-06-30 NOTE — Progress Notes (Signed)
Anticoagulation Progress Report   June 30, 2017  Name: ROCIO, Christopher Dickerson    Medical Record Number: [id number]  Reason for Treatment: Atrial fibrillation chronic     INR Range: 2.0-3.0    Date: 2017-06-30 16:50  Location: [facility]    Update Note: Called patient to let him know to take a 5 mg tablet tonight,   06/30/17.     Submitted by Janann Colonel RN

## 2017-06-30 NOTE — Progress Notes (Signed)
Anticoagulation Progress Report   June 30, 2017  Name: VLADISLAV, AXELSON    Medical Record Number: [id number]  Reason for Treatment: Atrial fibrillation chronic     INR Range: 2.0-3.0    Date: 2017-06-30 16:31  Location: [facility]    Update Note: Attempted to call patient in regards to missed appointment   today. Unable to leave message as mailbox is full.     Submitted by Janann Colonel RN

## 2017-07-01 ENCOUNTER — Ambulatory Visit: Payer: Medicare PPO | Attending: Cardiovascular Disease

## 2017-07-01 DIAGNOSIS — I482 Chronic atrial fibrillation, unspecified: Secondary | ICD-10-CM

## 2017-07-01 NOTE — Progress Notes (Signed)
Anticoagulation Progress Report   July 01, 2017  Name: Christopher Dickerson, Christopher Dickerson    Medical Record Number: [id number]  Reason for Treatment: Atrial fibrillation chronic     INR Range: 2.0-3.0    Date: 2017-07-01 15:45  Location: [facility]    INR: 1.80    Tablet Strength: 5 mg    Next INR: 7 days 2017-07-08    Signature Note: Assessment by  Janann Colonel RN; dosage by  Janann Colonel RN    Dosage Out: :Su-5mg :M-5mg :T-10mg :W-5mg :Th-10mg :F-5mg :Sa-5mg :

## 2017-07-02 ENCOUNTER — Ambulatory Visit (INDEPENDENT_AMBULATORY_CARE_PROVIDER_SITE_OTHER): Payer: Self-pay | Admitting: Cardiovascular Disease

## 2017-07-02 ENCOUNTER — Encounter (INDEPENDENT_AMBULATORY_CARE_PROVIDER_SITE_OTHER): Payer: Self-pay | Admitting: Cardiovascular Disease

## 2017-07-02 LAB — VH I-STAT INR: i-STAT INR: 1.8 (ref 0.0–3.0)

## 2017-07-02 NOTE — Telephone Encounter (Signed)
Patient called back in to office stating since last appt he is having increased SOB and he has to sleep sittng up as he does not feel safe laying down as it becomes harder for him to catch his breath. Offered patient to go to ED with worsening symptoms patient refused appt scheduled for tom at 8 am and advised patient if symptoms worsen go to ED.Lauree Chandlerathy Mazy Culton, LPN

## 2017-07-02 NOTE — Nursing Note (Signed)
Pt lvm on after hours line that he was SOB and wanted Dr Orvis BrillPlitt to call him back   I called pt back and LVM for him to contact us to get him in to see the Dr ASAP  [facility]

## 2017-07-03 ENCOUNTER — Inpatient Hospital Stay
Admission: EM | Admit: 2017-07-03 | Discharge: 2017-07-05 | DRG: 291 | Disposition: A | Discharge status: Home / Self Care | Payer: Medicare PPO | Attending: Internal Medicine | Admitting: Internal Medicine

## 2017-07-03 ENCOUNTER — Emergency Department: Payer: Medicare PPO

## 2017-07-03 ENCOUNTER — Encounter (INDEPENDENT_AMBULATORY_CARE_PROVIDER_SITE_OTHER): Payer: Self-pay | Admitting: Nurse Practitioner

## 2017-07-03 DIAGNOSIS — F17210 Nicotine dependence, cigarettes, uncomplicated: Secondary | ICD-10-CM | POA: Diagnosis present

## 2017-07-03 DIAGNOSIS — I5021 Acute systolic (congestive) heart failure: Secondary | ICD-10-CM | POA: Diagnosis present

## 2017-07-03 DIAGNOSIS — J449 Chronic obstructive pulmonary disease, unspecified: Secondary | ICD-10-CM | POA: Diagnosis present

## 2017-07-03 DIAGNOSIS — N182 Chronic kidney disease, stage 2 (mild): Secondary | ICD-10-CM | POA: Diagnosis present

## 2017-07-03 DIAGNOSIS — I509 Heart failure, unspecified: Secondary | ICD-10-CM

## 2017-07-03 DIAGNOSIS — I513 Intracardiac thrombosis, not elsewhere classified: Secondary | ICD-10-CM | POA: Diagnosis present

## 2017-07-03 DIAGNOSIS — I130 Hypertensive heart and chronic kidney disease with heart failure and stage 1 through stage 4 chronic kidney disease, or unspecified chronic kidney disease: Principal | ICD-10-CM | POA: Diagnosis present

## 2017-07-03 DIAGNOSIS — Z885 Allergy status to narcotic agent status: Secondary | ICD-10-CM

## 2017-07-03 DIAGNOSIS — R0602 Shortness of breath: Secondary | ICD-10-CM

## 2017-07-03 DIAGNOSIS — M1990 Unspecified osteoarthritis, unspecified site: Secondary | ICD-10-CM | POA: Diagnosis present

## 2017-07-03 DIAGNOSIS — I110 Hypertensive heart disease with heart failure: Secondary | ICD-10-CM

## 2017-07-03 DIAGNOSIS — Z7901 Long term (current) use of anticoagulants: Secondary | ICD-10-CM

## 2017-07-03 DIAGNOSIS — I482 Chronic atrial fibrillation: Secondary | ICD-10-CM | POA: Diagnosis present

## 2017-07-03 DIAGNOSIS — I481 Persistent atrial fibrillation: Secondary | ICD-10-CM | POA: Diagnosis present

## 2017-07-03 DIAGNOSIS — I429 Cardiomyopathy, unspecified: Secondary | ICD-10-CM | POA: Diagnosis present

## 2017-07-03 DIAGNOSIS — Z9049 Acquired absence of other specified parts of digestive tract: Secondary | ICD-10-CM

## 2017-07-03 DIAGNOSIS — I4819 Other persistent atrial fibrillation: Secondary | ICD-10-CM

## 2017-07-03 DIAGNOSIS — N179 Acute kidney failure, unspecified: Secondary | ICD-10-CM | POA: Diagnosis present

## 2017-07-03 LAB — CBC AND DIFFERENTIAL
Basophils %: 0.1 % (ref 0.0–3.0)
Basophils Absolute: 0 10*3/uL (ref 0.0–0.3)
Eosinophils %: 1.5 % (ref 0.0–7.0)
Eosinophils Absolute: 0.2 10*3/uL (ref 0.0–0.8)
Hematocrit: 45.8 % (ref 39.0–52.5)
Hemoglobin: 15.2 gm/dL (ref 13.0–17.5)
Lymphocytes Absolute: 1.4 10*3/uL (ref 0.6–5.1)
Lymphocytes: 12.5 % — ABNORMAL LOW (ref 15.0–46.0)
MCH: 32 pg (ref 28–35)
MCHC: 33 gm/dL (ref 32–36)
MCV: 96 fL (ref 80–100)
MPV: 7.8 fL (ref 6.0–10.0)
Monocytes Absolute: 0.7 10*3/uL (ref 0.1–1.7)
Monocytes: 6.3 % (ref 3.0–15.0)
Neutrophils %: 79.7 % — ABNORMAL HIGH (ref 42.0–78.0)
Neutrophils Absolute: 9.1 10*3/uL — ABNORMAL HIGH (ref 1.7–8.6)
PLT CT: 250 10*3/uL (ref 130–440)
RBC: 4.79 10*6/uL (ref 4.00–5.70)
RDW: 13.9 % (ref 11.0–14.0)
WBC: 11.5 10*3/uL — ABNORMAL HIGH (ref 4.0–11.0)

## 2017-07-03 LAB — CBC
Hematocrit: 45.2 % (ref 39.0–52.5)
Hemoglobin: 15.1 gm/dL (ref 13.0–17.5)
MCH: 32 pg (ref 28–35)
MCHC: 34 gm/dL (ref 32–36)
MCV: 95 fL (ref 80–100)
MPV: 7.9 fL (ref 6.0–10.0)
PLT CT: 259 10*3/uL (ref 130–440)
RBC: 4.77 10*6/uL (ref 4.00–5.70)
RDW: 14.1 % — ABNORMAL HIGH (ref 11.0–14.0)
WBC: 11.4 10*3/uL — ABNORMAL HIGH (ref 4.0–11.0)

## 2017-07-03 LAB — ECG 12-LEAD
Patient Age: 71 years
Q-T Interval(Corrected): 465 ms
Q-T Interval: 358 ms
QRS Axis: 83 deg
QRS Duration: 108 ms
T Axis: 231 years
Ventricular Rate: 101 //min

## 2017-07-03 LAB — BASIC METABOLIC PANEL
Anion Gap: 11.7 mMol/L (ref 7.0–18.0)
BUN / Creatinine Ratio: 18.7 Ratio (ref 10.0–30.0)
BUN: 29 mg/dL — ABNORMAL HIGH (ref 7–22)
CO2: 25 mMol/L (ref 20.0–30.0)
Calcium: 9.3 mg/dL (ref 8.5–10.5)
Chloride: 106 mMol/L (ref 98–110)
Creatinine: 1.55 mg/dL — ABNORMAL HIGH (ref 0.80–1.30)
EGFR: 44 mL/min/{1.73_m2} — ABNORMAL LOW (ref 60–150)
Glucose: 126 mg/dL — ABNORMAL HIGH (ref 71–99)
Osmolality Calc: 283 mOsm/kg (ref 275–300)
Potassium: 4.7 mMol/L (ref 3.5–5.3)
Sodium: 138 mMol/L (ref 136–147)

## 2017-07-03 LAB — CREATININE, SERUM
Creatinine: 1.66 mg/dL — ABNORMAL HIGH (ref 0.80–1.30)
EGFR: 41 mL/min/{1.73_m2} — ABNORMAL LOW (ref 60–150)

## 2017-07-03 LAB — VH I-STAT INR NOTIFICATION

## 2017-07-03 LAB — B-TYPE NATRIURETIC PEPTIDE: B-Natriuretic Peptide: 3179.1 pg/mL — ABNORMAL HIGH (ref 0.0–100.0)

## 2017-07-03 LAB — VH I-STAT INR: i-STAT INR: 2 (ref 0.0–3.0)

## 2017-07-03 LAB — THYROID STIMULATING HORMONE (TSH), REFLEX ON ABNORMAL TO FREE T4, SERUM
T4 Free: 0.84 ng/dL (ref 0.70–1.48)
TSH: 6.4 u[IU]/mL — ABNORMAL HIGH (ref 0.40–4.20)

## 2017-07-03 LAB — TROPONIN I
Troponin I: 0.02 ng/mL (ref 0.00–0.02)
Troponin I: 0.02 ng/mL (ref 0.00–0.02)
Troponin I: 0.03 ng/mL — ABNORMAL HIGH (ref 0.00–0.02)

## 2017-07-03 LAB — PT/INR
PT INR: 1.9 — ABNORMAL HIGH (ref 0.5–1.3)
PT: 18.7 s — ABNORMAL HIGH (ref 9.5–11.5)

## 2017-07-03 LAB — VH DEXTROSE STICK GLUCOSE: Glucose POCT: 111 mg/dL — ABNORMAL HIGH (ref 71–99)

## 2017-07-03 MED ORDER — FUROSEMIDE 10 MG/ML IJ SOLN
40.00 mg | Freq: Two times a day (BID) | INTRAMUSCULAR | Status: DC
Start: 2017-07-03 — End: 2017-07-03

## 2017-07-03 MED ORDER — SENNOSIDES-DOCUSATE SODIUM 8.6-50 MG PO TABS
2.00 | ORAL_TABLET | Freq: Every evening | ORAL | Status: DC
Start: 2017-07-03 — End: 2017-07-05
  Filled 2017-07-03 (×3): qty 2

## 2017-07-03 MED ORDER — CARVEDILOL 25 MG PO TABS
25.00 mg | ORAL_TABLET | Freq: Two times a day (BID) | ORAL | Status: DC
Start: 2017-07-03 — End: 2017-07-05
  Administered 2017-07-03 – 2017-07-05 (×4): 25 mg via ORAL
  Filled 2017-07-03 (×5): qty 1

## 2017-07-03 MED ORDER — METOPROLOL SUCCINATE ER 50 MG PO TB24
50.00 mg | ORAL_TABLET | Freq: Two times a day (BID) | ORAL | Status: DC
Start: 2017-07-03 — End: 2017-07-03
  Filled 2017-07-03: qty 1

## 2017-07-03 MED ORDER — FUROSEMIDE 10 MG/ML IJ SOLN
40.00 mg | Freq: Two times a day (BID) | INTRAMUSCULAR | Status: DC
Start: 2017-07-03 — End: 2017-07-05
  Administered 2017-07-03 – 2017-07-04 (×3): 40 mg via INTRAVENOUS
  Filled 2017-07-03 (×6): qty 4

## 2017-07-03 MED ORDER — WARFARIN SODIUM 5 MG PO TABS
5.00 mg | ORAL_TABLET | Freq: Every day | ORAL | Status: DC
Start: 2017-07-03 — End: 2017-07-05
  Administered 2017-07-03 – 2017-07-05 (×3): 5 mg via ORAL
  Filled 2017-07-03 (×3): qty 1

## 2017-07-03 MED ORDER — METOPROLOL TARTRATE 5 MG/5ML IV SOLN
5.00 mg | INTRAVENOUS | Status: DC | PRN
Start: 2017-07-03 — End: 2017-07-05

## 2017-07-03 MED ORDER — FUROSEMIDE 10 MG/ML IJ SOLN
40.00 mg | Freq: Once | INTRAMUSCULAR | Status: AC
Start: 2017-07-03 — End: 2017-07-03
  Administered 2017-07-03: 12:00:00 40 mg via INTRAVENOUS

## 2017-07-03 MED ORDER — SODIUM CHLORIDE 0.9 % IJ SOLN
3.00 mL | Freq: Three times a day (TID) | INTRAMUSCULAR | Status: DC
Start: 2017-07-03 — End: 2017-07-05
  Administered 2017-07-03 – 2017-07-05 (×5): 3 mL via INTRAVENOUS

## 2017-07-03 MED ORDER — LISINOPRIL 10 MG PO TABS
10.00 mg | ORAL_TABLET | Freq: Every day | ORAL | Status: DC
Start: 2017-07-03 — End: 2017-07-05
  Administered 2017-07-03 – 2017-07-05 (×3): 10 mg via ORAL
  Filled 2017-07-03 (×3): qty 1

## 2017-07-03 MED ORDER — ENOXAPARIN SODIUM 30 MG/0.3ML SC SOLN
30.00 mg | SUBCUTANEOUS | Status: DC
Start: 2017-07-03 — End: 2017-07-03

## 2017-07-03 MED ORDER — FUROSEMIDE 10 MG/ML IJ SOLN
INTRAMUSCULAR | Status: AC
Start: 2017-07-03 — End: ?
  Filled 2017-07-03: qty 4

## 2017-07-03 MED ORDER — NALOXONE HCL 0.4 MG/ML IJ SOLN (WRAP)
0.40 mg | INTRAMUSCULAR | Status: DC | PRN
Start: 2017-07-03 — End: 2017-07-05

## 2017-07-03 MED ORDER — VH WARFARIN THERAPY PLACEHOLDER
1.00 | Status: DC
Start: 2017-07-03 — End: 2017-07-05

## 2017-07-03 MED ORDER — DOCUSATE SODIUM 100 MG PO CAPS
100.00 mg | ORAL_CAPSULE | Freq: Every day | ORAL | Status: DC
Start: 2017-07-04 — End: 2017-07-05
  Administered 2017-07-05: 10:00:00 100 mg via ORAL
  Filled 2017-07-03 (×2): qty 1

## 2017-07-03 MED ORDER — VH SPIRONOLACTONE 25 MG PO TABS
25.00 mg | ORAL_TABLET | Freq: Every morning | ORAL | Status: DC
Start: 2017-07-04 — End: 2017-07-05
  Administered 2017-07-04 – 2017-07-05 (×2): 25 mg via ORAL
  Filled 2017-07-03 (×2): qty 1

## 2017-07-03 MED ORDER — FAMOTIDINE 20 MG PO TABS
20.00 mg | ORAL_TABLET | Freq: Two times a day (BID) | ORAL | Status: DC
Start: 2017-07-03 — End: 2017-07-05
  Administered 2017-07-03 – 2017-07-05 (×4): 20 mg via ORAL
  Filled 2017-07-03 (×5): qty 1

## 2017-07-03 NOTE — ED Provider Notes (Signed)
[facility] EMERGENCY DEPARTMENT History and Physical Exam      Patient Name: Christopher Dickerson, Christopher SR.  Encounter Date:  07/03/2017  Attending Physician: Veverly Fells. Barbette Mcglaun M.D.  PCP: Gayla Medicus, MD  Patient DOB:  1946-03-01  MRN:  [id number]  Room:  C15/C15-A      History of Presenting Illness     Chief complaint: Shortness of Breath    HPI/ROS is limited by: none  HPI/ROS given by: patient    Location: general (shortness of breath)  Duration: @ 10 days  Severity: moderate    Christopher Markey Sr. is a 72 y.o. male who presents with shortness of breath. The patient has a history of atrial fibrillation as well as cardiomyopathy states that over the last 10 days he's had increasing shortness of breath. He states that he cannot sleep at night. He has to sit up on his couch completely upright to get any kind sleep. Whenever he tries to lay back it becomes very short of breath and his heart starts racing. He denies any chest pain. He does have leg swelling. He denies any cough congestion or fever. He states that he did see his cardiologist and was put on Lasix but he is no longer taking it at this time. He denies any nausea vomiting or abdominal pain. He does have shortness breath with exertion as well. He denies any other aggravating or alleviating factors.      Review of Systems     Review of Systems   Constitutional: Negative for chills and fever.   HENT: Negative for congestion and sore throat.    Eyes: Negative for visual disturbance.   Respiratory: Positive for cough and shortness of breath. Negative for chest tightness.    Cardiovascular: Positive for palpitations and leg swelling. Negative for chest pain.   Gastrointestinal: Negative for abdominal pain, diarrhea, nausea and vomiting.   Genitourinary: Negative for dysuria and hematuria.   Musculoskeletal: Negative for back pain and neck pain.   Skin: Negative for rash.   Neurological: Negative for dizziness and headaches.   Psychiatric/Behavioral: Negative for  confusion.   All other systems reviewed and are negative.        Allergies     Pt is allergic to codeine.      Medications     No current facility-administered medications for this encounter.     Current Outpatient Prescriptions:   .  Cholecalciferol (VITAMIN D3) 400 units Cap, Take by mouth., Disp: , Rfl:   .  metoprolol succinate XL (TOPROL-XL) 50 MG 24 hr tablet, Take 50 mg by mouth 2 (two) times daily., Disp: , Rfl:   .  omeprazole (PRILOSEC) 40 MG capsule, Take 40 mg by mouth daily.  , Disp: , Rfl:   .  spironolactone (ALDACTONE) 25 MG tablet, Take 25 mg by mouth daily., Disp: , Rfl:   .  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      Past Surgical History     Pt has a past surgical history that includes LIFT, ARM (MEDICAL); Knee arthroscopy w/ ACL reconstruction; Appendectomy; COLONOSCOPY, POLYPECTOMY (12/16/2012); and EGD, BIOPSY (12/16/2012).      Family History     The family history is not on file.  Social History     Pt reports that he has been smoking Cigarettes.  He has been smoking about 0.50 packs per day. He has never used smokeless tobacco. He reports that he drinks alcohol. He reports that he does not use drugs.      Physical Exam     Blood pressure (!) 155/112, pulse (!) 104, temperature 97.5 F (36.4 C), resp. rate 15, height 1.829 m, weight 97.1 kg, SpO2 97 %.    Physical Exam   Constitutional: He is oriented to person, place, and time. He appears well-developed and well-nourished. No distress.   HENT:   Right Ear: External ear normal.   Left Ear: External ear normal.   Nose: Nose normal.   Mouth/Throat: Oropharynx is clear and moist. No oropharyngeal exudate.   Eyes: Conjunctivae are normal. No scleral icterus.   Neck: Normal range of motion.  Neck supple.   Cardiovascular: Normal rate and normal heart sounds.  An irregularly irregular rhythm present.   No murmur heard.  Pulmonary/Chest: Effort normal. No respiratory distress. He has no wheezes. He has rhonchi in the right lower field and the left lower field. He has no rales. He exhibits no tenderness.   Abdominal: Soft. There is no tenderness. There is no rebound and no guarding.   Musculoskeletal: Normal range of motion. He exhibits no tenderness.        Right lower leg: He exhibits edema.        Left lower leg: He exhibits edema.   Neurological: He is alert and oriented to person, place, and time. He exhibits normal muscle tone.   Skin: Skin is warm and dry. No rash noted. He is not diaphoretic. No erythema. No pallor.   Psychiatric: He has a normal mood and affect. His behavior is normal.   Nursing note and vitals reviewed.    Orders Placed     Orders Placed This Encounter   Procedures   . XR Chest 2 Views   . CBC   . BNP   . Basic Metabolic Panel   . TSH, Reflex  Free T4   . Troponin I   . I-Stat INR   . I-Stat INR   . ECG 12 lead (Stat)   . ED Admission Request       Diagnostic Results       The results of the diagnostic studies below have been reviewed by myself:    Labs  Results     Procedure Component Value Units Date/Time    TSH, Reflex  Free T4 [[id number]]  (Abnormal) Collected:  07/03/17 1025    Specimen:  Plasma Updated:  07/03/17 1152     TSH 6.40 (H) uIU/mL      T4 Free 0.84 ng/dL     BNP [[id number]]  (Abnormal) Collected:  07/03/17 1025    Specimen:  Blood Updated:  07/03/17 1113     B-Natriuretic Peptide 3,179.1 (H) pg/mL     I-Stat INR [[id number]] Collected:  07/03/17 1109    Specimen:  Blood Updated:  07/03/17 1113     i-STAT INR 2.0    I-Stat INR [[id number]] Collected:  07/03/17 1052    Specimen:  ISTAT Updated:  07/03/17 1108     I-STAT Notification Istat Notification    Troponin I [[id number]] Collected:  07/03/17 1025    Specimen:  Plasma Updated:  07/03/17 1103    Basic Metabolic  Panel [[id number]]  (Abnormal) Collected:  07/03/17 1025    Specimen:  Plasma Updated:  07/03/17 1059     Sodium 138 mMol/L      Potassium 4.7 mMol/L      Chloride 106 mMol/L      CO2 25.0 mMol/L      Calcium 9.3 mg/dL      Glucose 161 (H) mg/dL      Creatinine 0.96 (H) mg/dL      BUN 29 (H) mg/dL      Anion Gap 04.5 mMol/L      BUN/Creatinine Ratio 18.7 Ratio      EGFR 44 (L) mL/min/1.60m2      Osmolality Calc 283 mOsm/kg     CBC [[id number]]  (Abnormal) Collected:  07/03/17 1025    Specimen:  Blood from Blood Updated:  07/03/17 1044     WBC 11.5 (H) K/cmm      RBC 4.79 M/cmm      Hemoglobin 15.2 gm/dL      Hematocrit 78.2 %      MCV 96 fL      MCH 32 pg      MCHC 33 gm/dL      RDW 95.6 %      PLT CT 250 K/cmm      MPV 7.8 fL      NEUTROPHIL % 79.7 (H) %      Lymphocytes 12.5 (L) %      Monocytes 6.3 %      Eosinophils % 1.5 %      Basophils % 0.1 %      Neutrophils Absolute 9.1 (H) K/cmm      Lymphocytes Absolute 1.4 K/cmm      Monocytes Absolute 0.7 K/cmm      Eosinophils Absolute 0.2 K/cmm      BASO Absolute 0.0 K/cmm           Radiologic Studies  Radiology Results (24 Hour)     Procedure Component Value Units Date/Time    XR Chest 2 Views [[id number]] Collected:  07/03/17 1045    Order Status:  Completed Updated:  07/03/17 1047    Narrative:       Clinical History:  Patient complains of shortness of breath and weakness.  Patient has a history of hypertension, chronic obstructive pulmonary disease and atrial fibrillation.    Examination:  Frontal and lateral views of the chest.    Comparison:  May 31, 2017 and other prior studies.    Findings:  There are small bilateral pleural effusions and bibasilar atelectasis. Heart enlarged and stable. Mild pulmonary vascular congestion.      Impression:       Cardiomegaly and small pleural effusions consistent with mild congestive failure.    ReadingStation:[id number]        EKG Results     Procedure Component Value Units Date/Time    ECG 12 lead (Stat) [[id number]] Collected:   07/03/17 1006     Updated:  07/03/17 1027     Patient Age 81 years      Patient DOB 1946-03-01     Patient Height --     Patient Weight --     Interpretation Text --     Atrial fibrillation  Borderline right axis deviation  Abnormal R-wave progression, late transition  LVH with secondary repolarization abnormality  Compared to ECG 06/01/2017 05:52:28  No significant changes    Electronically Signed On 07-03-2017 10:27:03 EST by Mariea Stable       Physician Interpreter Mariea Stable     Ventricular Rate 101 //min  QRS Duration 108 ms      P-R Interval -- ms      Q-T Interval 358 ms      Q-T Interval(Corrected) 465 ms      P Wave Axis -- deg      QRS Axis 83 deg      T Axis 231 years           ED Course     The patient remained stable during his course in emergency. He was found to have CHF on x-ray with a significantly elevated BNP over 3000. The patient was given IV Lasix. He will be admitted for diuresis and further management.      MDM / Critical Care     Blood pressure (!) 155/112, pulse (!) 104, temperature 97.5 F (36.4 C), resp. rate 15, height 1.829 m, weight 97.1 kg, SpO2 97 %.    The patient's presentation is suggestive of CHF. Evaluation and treatment for this patient has been initiated in the ER, but the patient has not had significant improvement in symptoms, appears ill enough and/or has illness/findings/co-morbidities and hypoxemia that make admission for IV medications, oxygen treatment, and further management the most appropriate disposition.  The differential has included but is not limited to pneumonia, bronchitis/COPD, asthma, cardiac pathology, CHF, cancer, pneumothorax.  Diagnostic impression and plan were discussed and agreed upon with the patient and/or family.  Results of lab/radiology tests were reviewed and discussed with the patient and/or family. All questions were answered and concerns addressed.  Appropriate consultation was made for admission and further treatment of this  patient.         Procedures         Diagnosis / Disposition     Clinical Impression  1. Acute on chronic congestive heart failure, unspecified heart failure type    2. Shortness of breath    3. Persistent atrial fibrillation        Disposition  ED Disposition     ED Disposition Condition Date/Time Comment    Admit  Thu Jul 03, 2017 11:57 AM Christopher Fenton Sr. to be admitted.    Condition at disposition: Stable            Prescriptions  New Prescriptions    No medications on file         In addition to the above history, please see nursing notes.  Allergies, meds, past medical, family, social, history and the results from the diagnostic studies performed have been reviewed by myself.     This note has been created by an Electronic Medical Record that may contain additions or subtractions not intended by myself.  Myna Bright, MD     Isabella Stalling, MD  07/03/17 [phone]

## 2017-07-03 NOTE — ED Notes (Signed)
Pt A&O x4. Tele 1, box placed on pt. VSS. Transport ordered.

## 2017-07-03 NOTE — ED Notes (Signed)
Presents with SOB x3 days, HX of A-fib, on blood thinners. States the racing heart keeps him up at night. A&O x4.

## 2017-07-03 NOTE — Plan of Care (Addendum)
3:45 PM  Assumed care of pt, pt A/IOx4. VSS, pt adm to room 365, tele intact, iv intact, pt board updated, pt denies pain or needs       Problem: Hemodynamic Status: Cardiac  Goal: Stable vital signs and fluid balance  Outcome: Progressing   07/03/17 1545   Goal/Interventions addressed this shift   Stable vital signs and fluid balance Monitor/assess vital signs and telemetry per unit protocol;Assess signs and symptoms associated with cardiac rhythm changes;Monitor intake/output per unit protocol and/or LIP order;Weigh on admission and record weight daily;Monitor for leg swelling/edema and report to LIP if abnormal;Monitor lab values     Pt adm with CHF exacerbation

## 2017-07-03 NOTE — H&P (Signed)
HISTORY AND PHYSICAL - [facility]    Date Time: 07/03/17 3:52 PM  Patient Name: Christopher Dickerson, Christopher SR.  Attending Physician: Astrid Drafts, MD  Primary Care Physician: Gayla Medicus, MD    CC:   Chief Complaint   Patient presents with   . Shortness of Breath       Assessment and Plan                                                          [facility]     Active Problems:    CHF (congestive heart failure)    Acute systolic congestive heart failure   Known cardiomyopathy with ejection fraction of 15%  Admit to medical floor  Monitor on telemetry  IV Lasix 40 mg twice a day  Monitor daily body weight and urine output  Continue with his spironolactone   Patient is  taking metoprolol at home, (he was discharged on Coreg last admission ). Changed to Coreg  Add ACE inhibitor's    Atrial fibrillation chronic  -  continue with oral    metoprolol and warfarin, close INR monitoring,  IV metoprolol when necessary for heart rate more than 90     History of right apical ventricular thrombus-  on anticoagulation with Coumadin    arf  with CkD stage II-  elevated serum creatinine and urine likely due to hypoperfusion  /  cardiorenal syndrome in view of decompensated heart failure, continue to monitor      Chronic obstructive pulmonary disease- stable, patient continues to smoke, denies any wheezing    Hypertension-  Uncontrolled,  we'll add lisinopril, expect improvement in blood pressure with IV diuresis     tobacco abuse-   coonseled to quit      DVT  prophylaxis-  on Coumadin  CODE STATUS full    History of Presenting Illness and ROS:                               [facility]        Christopher Holloway Sr. is a 72 y.o. male with PMHx of a.fib, cardiomyopathhy  who presents to the hospital with increased shortness of breath  .  He reports having difficulty lying flat.   Christopher Dickerson He was not being able to sleep for last  3 nights  because he is not able to lie down flat and gets  Short of breath  easily .  He is feeling better after  Lasix in ER.   The patient denies any chest pain, cough with expectoration, fever, chills, nasal congestion sore throat.       Past Medical History:                                                            [facility]       Past Medical History:   Diagnosis Date   . Arthritis    . Atrial fibrillation    . Cardiomyopathy    . Chronic obstructive pulmonary disease    . Hypertension    .  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    Secondary to Atrial Fib       Past Surgical History:                                                            [facility]       Past Surgical History:   Procedure Laterality Date   . APPENDECTOMY     . COLONOSCOPY, POLYPECTOMY  12/16/2012    Procedure: COLONOSCOPY, POLYPECTOMY;  Surgeon: Gwenith Spitz, MD;  Location: [facility];  Service: Gastroenterology;  Laterality: N/A;   . EGD, BIOPSY  12/16/2012    Procedure: EGD, BIOPSY;  Surgeon: Gwenith Spitz, MD;  Location: [facility];  Service: Gastroenterology;  Laterality: N/A;   . KNEE ARTHROSCOPY W/ ACL RECONSTRUCTION     . LIFT, ARM (MEDICAL)         Family History:                                                                         [facility]       History reviewed. No pertinent family history.    Social History:                                                                         [facility]       History   Smoking Status   . Current Every Day Smoker   . Packs/day: 0.50   . Types: Cigarettes   Smokeless Tobacco   . Never Used     History   Alcohol Use   . Yes     Comment: occasional     History   Drug Use No       Allergies:                                                                                   [facility]       Allergies   Allergen Reactions   . Codeine        Medications:  [facility]       Current Discharge Medication List      CONTINUE  these medications which have NOT CHANGED    Details   Cholecalciferol (VITAMIN D3) 400 units Cap Take 1 capsule by mouth every morning.          metoprolol succinate XL (TOPROL-XL) 50 MG 24 hr tablet Take 50 mg by mouth 2 (two) times daily.          omeprazole (PRILOSEC) 40 MG capsule Take 40 mg by mouth every morning.          spironolactone (ALDACTONE) 25 MG tablet Take 25 mg by mouth every morning.          warfarin (COUMADIN) 5 MG tablet Take by mouth.      Refills: 3              Review Of Systems:                                                               [facility]       Review of Systems   Constitutional: Positive for malaise/fatigue. Negative for chills and fever.   HENT: Negative.    Eyes: Negative.    Respiratory: Positive for cough and shortness of breath.    Cardiovascular: Positive for orthopnea and PND. Negative for leg swelling.   Gastrointestinal: Negative.    Genitourinary: Negative.    Musculoskeletal: Negative.    Neurological: Positive for dizziness and weakness.   Psychiatric/Behavioral: Negative.          Physical Exam:                                                                         [facility]       Patient Vitals for the past 24 hrs:   BP Temp Temp src Pulse Resp SpO2 Height Weight   07/03/17 1519 (!) 159/113 98.6 F (37 C) Oral (!) 106 18 98 % - -   07/03/17 1415 (!) 139/105 97.5 F (36.4 C) - (!) 106 (!) 24 98 % - -   07/03/17 1402 (!) 139/105 - - 93 17 96 % - -   07/03/17 1317 (!) 144/106 - - 97 14 95 % - -   07/03/17 1156 - - - (!) 104 15 97 % - -   07/03/17 1147 (!) 155/112 - - (!) 106 (!) 31 95 % - -   07/03/17 1030 - - - (!) 101 18 96 % - -   07/03/17 1002 153/83 97.5 F (36.4 C) - (!) 117 22 93 % 1.829 m (6') 97.1 kg (214 lb 1.1 oz)     Body mass index is 29.03 kg/m.  No intake or output data in the 24 hours ending 07/03/17 1552    General: awake, alert, oriented x 3; no acute distress.  HEENT: perrla, eomi, sclera anicteric  oropharynx clear without  lesions, mucous membranes moist  Glands: No cervical or axillary lymphadenopathy.  Neck: supple, no  lymphadenopathy, no thyromegaly, no JVD, no carotid bruits  Cardiovascular: regular rate and rhythm, no rubs or gallops  Lungs: clear to auscultation bilaterally, without wheezing, rhonchi, or rales  Abdomen: soft, non-tender, non-distended; no palpable masses, no hepatosplenomegaly, normoactive bowel sounds, no rebound or guarding  Extremities: no clubbing, cyanosis, or edema  Neuro: cranial nerves grossly intact, strength 5/5 in upper and lower extremities, sensation intact,   Psych: Normal affect, not depressed   Skin: no rashes or lesions noted    Labs and Imaging:                                                                   [facility]       Results     Procedure Component Value Units Date/Time    Troponin I [[id number]] Collected:  07/03/17 1025    Specimen:  Plasma Updated:  07/03/17 1221     Troponin I 0.02 ng/mL     TSH, Reflex  Free T4 [[id number]]  (Abnormal) Collected:  07/03/17 1025    Specimen:  Plasma Updated:  07/03/17 1152     TSH 6.40 (H) uIU/mL      T4 Free 0.84 ng/dL     BNP [[id number]]  (Abnormal) Collected:  07/03/17 1025    Specimen:  Blood Updated:  07/03/17 1113     B-Natriuretic Peptide 3,179.1 (H) pg/mL     I-Stat INR [[id number]] Collected:  07/03/17 1109    Specimen:  Blood Updated:  07/03/17 1113     i-STAT INR 2.0    I-Stat INR [[id number]] Collected:  07/03/17 1052    Specimen:  ISTAT Updated:  07/03/17 1108     I-STAT Notification Istat Notification    Basic Metabolic Panel [[id number]]  (Abnormal) Collected:  07/03/17 1025    Specimen:  Plasma Updated:  07/03/17 1059     Sodium 138 mMol/L      Potassium 4.7 mMol/L      Chloride 106 mMol/L      CO2 25.0 mMol/L      Calcium 9.3 mg/dL      Glucose 536 (H) mg/dL      Creatinine 6.44 (H) mg/dL      BUN 29 (H) mg/dL      Anion Gap 03.4 mMol/L      BUN/Creatinine Ratio 18.7 Ratio      EGFR 44 (L) mL/min/1.13m2      Osmolality Calc  283 mOsm/kg     CBC [[id number]]  (Abnormal) Collected:  07/03/17 1025    Specimen:  Blood from Blood Updated:  07/03/17 1044     WBC 11.5 (H) K/cmm      RBC 4.79 M/cmm      Hemoglobin 15.2 gm/dL      Hematocrit 75.6 %      MCV 96 fL      MCH 32 pg      MCHC 33 gm/dL      RDW 43.3 %      PLT CT 250 K/cmm      MPV 7.8 fL      NEUTROPHIL % 79.7 (H) %      Lymphocytes 12.5 (L) %      Monocytes 6.3 %      Eosinophils % 1.5 %  Basophils % 0.1 %      Neutrophils Absolute 9.1 (H) K/cmm      Lymphocytes Absolute 1.4 K/cmm      Monocytes Absolute 0.7 K/cmm      Eosinophils Absolute 0.2 K/cmm      BASO Absolute 0.0 K/cmm           Imaging: Xr Chest 2 Views    Result Date: 07/03/2017  Cardiomegaly and small pleural effusions consistent with mild congestive failure. ReadingStation:[id number]      Signed by: Astrid Drafts, MD    Total time spent in counseling and/or coordination of care with other physicians, other health care professionals, patient and family is 70 minutes.     [facility]  [address]  [place], [id number]    YN:[id number], Lysbeth Penner, MD

## 2017-07-04 LAB — CBC AND DIFFERENTIAL
Basophils %: 0.7 % (ref 0.0–3.0)
Basophils Absolute: 0.1 10*3/uL (ref 0.0–0.3)
Eosinophils %: 3.8 % (ref 0.0–7.0)
Eosinophils Absolute: 0.3 10*3/uL (ref 0.0–0.8)
Hematocrit: 44.1 % (ref 39.0–52.5)
Hemoglobin: 14.1 gm/dL (ref 13.0–17.5)
Lymphocytes Absolute: 1.6 10*3/uL (ref 0.6–5.1)
Lymphocytes: 20.2 % (ref 15.0–46.0)
MCH: 30 pg (ref 28–35)
MCHC: 32 gm/dL (ref 32–36)
MCV: 94 fL (ref 80–100)
MPV: 7.8 fL (ref 6.0–10.0)
Monocytes Absolute: 0.7 10*3/uL (ref 0.1–1.7)
Monocytes: 8.2 % (ref 3.0–15.0)
Neutrophils %: 67 % (ref 42.0–78.0)
Neutrophils Absolute: 5.3 10*3/uL (ref 1.7–8.6)
PLT CT: 214 10*3/uL (ref 130–440)
RBC: 4.67 10*6/uL (ref 4.00–5.70)
RDW: 13.9 % (ref 11.0–14.0)
WBC: 8 10*3/uL (ref 4.0–11.0)

## 2017-07-04 LAB — BASIC METABOLIC PANEL
Anion Gap: 12.3 mMol/L (ref 7.0–18.0)
BUN / Creatinine Ratio: 19.9 Ratio (ref 10.0–30.0)
BUN: 28 mg/dL — ABNORMAL HIGH (ref 7–22)
CO2: 24.5 mMol/L (ref 20.0–30.0)
Calcium: 8.7 mg/dL (ref 8.5–10.5)
Chloride: 104 mMol/L (ref 98–110)
Creatinine: 1.41 mg/dL — ABNORMAL HIGH (ref 0.80–1.30)
EGFR: 50 mL/min/{1.73_m2} — ABNORMAL LOW (ref 60–150)
Glucose: 103 mg/dL — ABNORMAL HIGH (ref 71–99)
Osmolality Calc: 280 mOsm/kg (ref 275–300)
Potassium: 3.8 mMol/L (ref 3.5–5.3)
Sodium: 137 mMol/L (ref 136–147)

## 2017-07-04 LAB — PT/INR
PT INR: 1.9 — ABNORMAL HIGH (ref 0.5–1.3)
PT: 18.9 s — ABNORMAL HIGH (ref 9.5–11.5)

## 2017-07-04 LAB — VH DEXTROSE STICK GLUCOSE: Glucose POCT: 104 mg/dL — ABNORMAL HIGH (ref 71–99)

## 2017-07-04 NOTE — Plan of Care (Addendum)
8:09 PM  Assumed care of pt from Dayshift RN, white boards updated and pt aware of today's POC, pt resting in bed, assessment complete, denies pain or needs at this time, tele intact, IV INT, call bell within reach, pt up ad lib, will continue to monitor.    Problem: Moderate/High Fall Risk Score >5  Goal: Patient will remain free of falls  Outcome: Progressing   07/04/17 0808   Moderate Risk Falls Interventions (6-13)   VH Moderate Risk (6-13) PLACE FALL RISK LEVEL ON WHITE BOARD FOR COMMUNICATION PURPOSES IN PATIENT'S ROOM;USE OF BED EXIT ALARM IF PATIENT IS CONFUSED OR IMPULSIVE. PLACE RESET BED ALARM SIGN ABOVE BED;YELLOW "FALL RISK" ARM BAND;YELLOW NON-SKID SLIPPERS;INITIATE YELLOW "FALL RISK" SIGNAGE;ALL REQUIRED LOW INTERVENTIONS

## 2017-07-04 NOTE — Plan of Care (Signed)
Problem: Hemodynamic Status: Cardiac  Goal: Stable vital signs and fluid balance   07/03/17 1545   Goal/Interventions addressed this shift   Stable vital signs and fluid balance Monitor/assess vital signs and telemetry per unit protocol;Assess signs and symptoms associated with cardiac rhythm changes;Monitor intake/output per unit protocol and/or LIP order;Weigh on admission and record weight daily;Monitor for leg swelling/edema and report to LIP if abnormal;Monitor lab values   Vitals and tele are under observation, intake and output assessed. Will continue to monitor.

## 2017-07-04 NOTE — UM Notes (Signed)
R     [facility] - 2122  EHR eReferral Notification Requirements    To be sent by secure email to [email or web address] or   by fax to [phone]   [facility] Resources  FIELDS ARE REQUIRED TO BE COMPLETED     Patient Name  Christopher Dickerson  Account Number   [id number]  Date of Birth:  18-Sep-1945  Date of Admission:   3/7//2019  Start of Initial Service Date:      PLEASE CHECK OFF TYPE OF REVIEW & CURRENT ADMISSION STATUS FROM LISTS BELOW    Type of Review:  X  Admission Review  Observation Follow-Up Review  . Dates to be Reviewed:  IP Continued Stay Review  . Dates to be Reviewed:   Procedure Review  Cardiac Procedure Review      Concurrent Denial   (If selected, please complete page 2)  Admission Review & Concurrent Denial if found IP (If selected, please complete page 2)  Post Discharge Review  . Discharged Date:   Readmission Review  . Dates of Previous Admission:     Current Order:X Inpatient      Observation / Outpatient Services  Outpatient Procedure / Cardiology / Vascular   Other  Case Manager Name/Contact Number: Antonietta Jewel RN BSN UR [phone]  Attending Physician/Contact Number: DO NOT CONTACT MD   Comments:         Additional Information being Emailed or Faxed:   Yes                 No   Fax Handwritten Supporting Documents to EHR at [phone]

## 2017-07-04 NOTE — PT Eval Note (Signed)
[facility]  Patient: Christopher Dickerson.     CSN: [id number]    Bed: 365/365-A  Physical Therapy EVALUATION  Visit#: 1   Treatment Frequency: one time visit  Last seen by a physical therapist vs. Physical therapist assistant: 07/04/2017      DISCHARGE RECOMMENDATIONS   Discharge Recommendations:   Home with no needs       *Discharge recommendations are subject to change based on patient's progress and/or home support changes - please refer to most recent PT note for current recommendation    DME recommended for Discharge:   None    PMP (Progressive Mobility Program) Recommendations:   Recommend patient  ambulate 2-3 times/day with No device and physical assist and/or supervision of 0 staff, be up as tolerated as tolerated.     Precautions and Contraindications:   NONE    PT Assessment and Plan of Care (Treatment frequency noted above):     HPI (per physician charting) and Pertinent Medical Details:  Admitted 07/03/2017 with/for PMHx of a.fib, cardiomyopathhy  who presents to the hospital with increased shortness of breath. Diagnosed with CHF.      Goals:    None per pt functioning at baseline     PT Assessment:  Pt's PLOF is independent with all ADLs. 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 Patient is functioning at his baseline at this time, no further acute PT needs      Treatment/interventions: No skilled PT interventions needed at this time.    Due to the presence of no treatment options and no comorbidities or personal factors, as well as patient's stable and/or uncomplicated characteristics, modifications were NOT necessary to complete evaluation when examining 1-2 elements (includes body structures and functions, activity limitations and/or participation restrictions) determines the degree of complexity for this patient is LOW    Rehabilitation Potential:Not a PT candidate    Discussed risk, benefits and Plan of Care with: Patient    *note: Clinical Presentation and  Decision Making includes the following sections: Goals, PT assessment, treatment frequency and treatment/interventions):    History Based on physician charted EPIC/EMR information:     Medical Diagnosis: Shortness of breath [R06.02]  Persistent atrial fibrillation [I48.1]  Acute on chronic congestive heart failure, unspecified heart failure type [I50.9]    Problem list:  Patient Active Problem List   Diagnosis   . Abdominal pain   . Hypertension   . Atrial fibrillation   . Nausea   . Atrial fibrillation with RVR   . CHF (congestive heart failure)        Past Medical/Surgical History:  Past Medical History:   Diagnosis Date   . Arthritis    . Atrial fibrillation    . Cardiomyopathy    . Chronic obstructive pulmonary disease    . Hypertension    . 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 APPENDECTOMY     . COLONOSCOPY, POLYPECTOMY  12/16/2012    Procedure: COLONOSCOPY, POLYPECTOMY;  Surgeon: Gwenith Spitz, MD;  Location: [facility];  Service: Gastroenterology;  Laterality: N/A;   . EGD, BIOPSY  12/16/2012    Procedure: EGD, BIOPSY;  Surgeon: Gwenith Spitz, MD;  Location: [facility];  Service: Gastroenterology;  Laterality: N/A;   . KNEE ARTHROSCOPY W/ ACL RECONSTRUCTION     . LIFT, ARM (MEDICAL)  Social History    Information per Patient:    Home Living Arrangements:  Living Arrangements: "there are 3 others there"  Assistance Available: Part time  Type of Home: House  Home Layout: Two level, with 3 stair(s) to enter, Able to live on main level with bedroom and bathroom, Basement    Prior Level of Function:  Two level, with 3 stair(s) to enter, Basement  Fall history: 1 but was due to elevator shaft at work    DME available at home:  None    Subjective   "I work when I can and when I'm not like this."   Patient/family/caregiver consent to therapy session is noted by the participation in the therapy  session.    Pain:  At Rest: 0/10  With Activity: 0/10  Location: N/A  Interventions: None required    Examination of Body Systems (Structures, Function, Activity and Participation)   Patient's medical condition is appropriate for Physical therapy intervention at this time    Observation of patient:  Patient is in bed with telemetry     Cognition:  Oriented to: Oriented x4  Command following: Follows ALL commands and directions without difficulty    Vital Signs (Cardiovascular):  See flowsheet between start time and stop times for vitals taken during session             Balance:  Static Sitting:  WFL  Dynamic Sitting:  WFL  Static Standing:  WFL  Dynamic Standing:  WFL            Musculoskeletal Examination:            Range of motion:  Right LE: Grossly WFL  Left LE: Grossly WFL       Strength:  Right LE: Grossly WFL  Left LE: Grossly WFL        Functional Mobility:    Bed Mobility:  Rolling to Right:   Independent.       Supine to Sit:   Independent.        Sit to Supine:   Independent.        Seated Scooting:   Independent    Transfers:  Sit to Stand:  Independent with No assistive device.         Stand to Sit:  Independent.           Locomotion:  LEVEL AMBULATION:  Distance: 449ft   Assistance level:  Independent  Device:  No assistive device  Pattern:  WFL = Within functional limits  STAIR MANAGEMENT:  Number of steps: 12  One rail  Step-over-step  Forward         Treatment Interventions this session:   Evaluation    Education Provided:   TOPICS: role of physical therapy, plan of care, goals of therapy and benefits of activity     Learner educated: Patient  Method: Explanation  Response to education: Demonstrated understanding    Patient Position at End of Treatment:   Supine, in bed, Needs in reach and No distress    Team Communication:     Spoke to: RN/LPN [place]  Regarding: Patient position at end of session, Patient participation with Therapy, Further recommendations  Whiteboard updated: Yes  PT/PTA  communication: via written note and verbal communication as needed.    Time of treatment:  Time Calculation  PT Received On: 07/04/17  Start Time: 1033  Stop Time: 1048  Time Calculation (min): 15 min    Audry Riles, PT, DPT

## 2017-07-04 NOTE — Plan of Care (Addendum)
Bedside report completed with off-going RN. Assumed care of patient. Reviewed orders and updated plan of care whiteboard. Appropriate safety interventions in place w/ bed alarm on and audible. Tele intact. IV site intact and saline locked. Patient encouraged to call for assist. Will continue to monitor.      Care plan:  Problem: Hemodynamic Status: Cardiac  Goal: Stable vital signs and fluid balance  Outcome: Progressing   07/03/17 1545   Goal/Interventions addressed this shift   Stable vital signs and fluid balance Monitor/assess vital signs and telemetry per unit protocol;Assess signs and symptoms associated with cardiac rhythm changes;Monitor intake/output per unit protocol and/or LIP order;Weigh on admission and record weight daily;Monitor for leg swelling/edema and report to LIP if abnormal;Monitor lab values

## 2017-07-04 NOTE — UM Notes (Signed)
[facility] Utilization Management Review Sheet    Facility :  [facility]    NAME: Christopher Monje Sr.  MR#: [id number]    CSN#: [id number]    ROOM: 365/365-A AGE: 72 y.o.    ADMIT DATE AND TIME: 07/03/2017 10:13 AMMD Order 07/03/2017 @ 1337   Original Order     Ordered On Ordered By    07/03/2017 1:37 PM Astrid Drafts, MD          PATIENT CLASS: Inpatient     ATTENDING PHYSICIAN: Astrid Drafts, MD  PAYOR:Payor: MEDICARE HMO / Plan: Francine Graven MEDICARE PPO / Product Type: MANAGED MEDICARE /       AUTH #:     DIAGNOSIS:     ICD-10-CM    1. Acute on chronic congestive heart failure, unspecified heart failure type I50.9    2. Shortness of breath R06.02    3. Persistent atrial fibrillation I48.1        HISTORY:   Past Medical History:   Diagnosis Date   . Arthritis    . Atrial fibrillation    . Cardiomyopathy    . Chronic obstructive pulmonary disease    . Hypertension    . 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 CHF (congestive heart failure)       ED Treatment    Presentation:  a 72 y.o. male who presents with shortness of breath. The patient has a history of atrial fibrillation as well as cardiomyopathy states that over the last 10 days he's had increasing shortness of breath. He states that he cannot sleep at night. He has to sit up on his couch completely upright to get any kind sleep. Whenever he tries to lay back it becomes very short of breath and his heart starts racing. He denies any chest pain. He does have leg swelling. He denies any cough congestion or fever. He states that he did see his cardiologist and was put on Lasix but he is no longer taking it at this time Positive for cough and shortness of breath. Negative for  chest tightness.  Cardiovascular: Positive for palpitations and leg swelling 155/112, pulse (!) 104, temperature 97.5 F (36.4 C), resp. rate 15, height 1.829 m, weight 97.1 kg, SpO2 97 %., An irregularly irregular rhythm present.  has rhonchi in the right lower field and the left lower field. BLE  exhibits edema  Labs:  TSH 6.4, BNP 3179.1, Glu 126, Creat 1055,  BUM 29,  EGFR 44, WBC 11.5,  CXR Cardiomegaly and small pleural effusions consistent with mild congestive failure.Trop 0.04,   Meds:  Lasix  Medical Admission Review  Medical   H&P:   72 y.o. male with PMHx of a.fib, cardiomyopathhy  who presents to the hospital with increased shortness of breath  .  He reports having difficulty lying flat.   Christopher Dickerson He was not being able to sleep for last  3 nights  because he is not able to lie down flat and gets  Short of breath easily .  He is feeling better after  Lasix in ER. The patient denies any chest pain, cough with expectoration, fever, chills, nasal congestion sore throat. Positive for malaise/fatigue.Positive for cough and shortness of breath  Positive for orthopnea and PND Positive for dizziness and weakness  07/03/17 1519 (!) 159/113 98.6 F (37 C) Oral (!) 106 18 98 % 2 L    Labs:  See above From ER  Assessment/ Plan:  Per MD notes "  Acute systolic congestive heart failure   Known cardiomyopathy with ejection fraction of 15%  Admit to medical floor  Monitor on telemetry  IV Lasix 40 mg twice a day  Monitor daily body weight and urine output  Continue with his spironolactone   Patient is  taking metoprolol at home, (he was discharged on Coreg last admission ). Changed to Coreg  Add ACE inhibitor's    Atrial fibrillation chronic  -  continue with oral    metoprolol and warfarin, close INR monitoring,  IV metoprolol when necessary for heart rate more than 90     History of right apical ventricular thrombus-  on anticoagulation with Coumadin    arf  with CkD stage II-  elevated serum creatinine and urine likely  due to hypoperfusion  /  cardiorenal syndrome in view of decompensated heart failure, continue to monitor      Chronic obstructive pulmonary disease- stable, patient continues to smoke, denies any wheezing    Hypertension-  Uncontrolled,  we'll add lisinopril, expect improvement in blood pressure with IV diuresis     tobacco abuse-   coonseled to quit      DVT  prophylaxis-  on Coumadin  CODE STATUS full    Antonietta Jewel R.N. BSN  Utilization Management  [facility]  [address]  [place], [place] [address]  Phone [phone]   Fax [phone]

## 2017-07-04 NOTE — Progress Notes (Signed)
PROGRESS NOTE - [facility]    Date Time: 07/04/17 2:54 PM  Patient Name: Christopher Dickerson, Christopher SR.  Attending Physician: Astrid Drafts, MD    Assessment and Plan                                                       [facility]     Active Problems:    CHF (congestive heart failure)  Resolved Problems:    * No resolved hospital problems. *      Acute systolic congestive heart failure   Known cardiomyopathy with ejection fraction of 15%  Symptomatically much better  Monitor on telemetry  IV Lasix 40 mg twice a day  Monitor daily body weight and urine output  Continue with his spironolactone   Patient is  taking metoprolol at home, (he was discharged on Coreg last admission ). Changed to Coreg  Add ACE inhibitor    Atrial fibrillation chronic  -  continue with oral    metoprolol and warfarin, close INR monitoring,  IV metoprolol when necessary for heart rate more than 90     History of right apical ventricular thrombus-  on anticoagulation with Coumadin    arf  with CkD stage II-  elevated serum creatinine and urine likely due to hypoperfusion  /  cardiorenal syndrome in view of decompensated heart failure, continue to monitor  , s.creat  Is improving with diuresis     Chronic obstructive pulmonary disease- stable, patient continues to smoke, denies any wheezing    Hypertension-  Uncontrolled,  we'll add lisinopril, expect improvement in blood pressure with IV diuresis     tobacco abuse-   coonseled to quit      DVT  prophylaxis-  on Coumadin  CODE STATUS full          Subjective                                                                          [facility] well , got some sleep last night  , could not sleep at home due to  Orthopnea   No chest pain       Physical Exam:                                                                    [facility]     Temp:  [97.5 F (36.4 C)-98.6 F (37 C)] 97.7 F (36.5 C)  Heart Rate:  [80-116] 94  Resp Rate:   [16-20] 20  BP: (105-169)/(71-127) 108/71    Intake/Output Summary (Last 24 hours) at 07/04/17 1454  Last data filed at 07/04/17 0900   Gross per 24 hour   Intake              590 ml  Output             1950 ml   Net            -1360 ml     Weight Monitoring 02/12/2016 04/13/2017 04/13/2017 04/13/2017 04/14/2017 07/03/2017 07/03/2017   Height 182.9 cm 182.9 cm 182.9 cm - - 182.9 cm -   Height Method Stated Stated Stated - - - -   Weight 92.08 kg 95.255 kg 96.707 kg 92.7 kg 91.218 kg 97.1 kg 93.6 kg   Weight Method Stated Stated Standing Scale - Standing Scale - Standing Scale   BMI (calculated) 27.6 kg/m2 28.5 kg/m2 29 kg/m2 - - 29.1 kg/m2 -       General: awake, alert, oriented x 3; no acute distress.  HEENT: perrla, eomi, sclera anicteric  oropharynx clear without lesions, mucous membranes moist  Glands: No cervical or axillary lymphadenopathy.  Neck: supple, no lymphadenopathy, no thyromegaly, no JVD, no carotid bruits  Cardiovascular: regular rate and rhythm, no rubs or gallops  Lungs: clear to auscultation bilaterally, without wheezing, rhonchi, or rales  Abdomen: soft, non-tender, non-distended; no palpable masses, no hepatosplenomegaly, normoactive bowel sounds, no rebound or guarding  Extremities: no clubbing, cyanosis, or edema  Neuro: cranial nerves grossly intact, strength 5/5 in upper and lower extremities, sensation intact,   Psych: Normal affect, not depressed   Skin: no rashes or lesions noted      Meds:                                                                                  [facility]       Current Facility-Administered Medications   Medication Dose Route Frequency Provider Last Rate Last Dose   . carvedilol (COREG) tablet 25 mg  25 mg Oral Q12H [facility] Astrid Drafts, MD   25 mg at 07/04/17 0904   . docusate sodium (COLACE) capsule 100 mg  100 mg Oral Daily Astrid Drafts, MD       . famotidine (PEPCID) tablet 20 mg  20 mg Oral BID Astrid Drafts, MD   20 mg at 07/04/17  0903   . furosemide (LASIX) injection 40 mg  40 mg Intravenous BID Astrid Drafts, MD   40 mg at 07/04/17 0903   . lisinopril (PRINIVIL,ZESTRIL) tablet 10 mg  10 mg Oral Daily Astrid Drafts, MD   10 mg at 07/04/17 0903   . metoprolol tartrate (LOPRESSOR) injection 5 mg  5 mg Intravenous Q5 Min PRN Astrid Drafts, MD       . naloxone [facility]) injection 0.4 mg  0.4 mg Intravenous PRN Astrid Drafts, MD       . senna-docusate (PERICOLACE) 8.6-50 MG per tablet 2 tablet  2 tablet Oral QHS Astrid Drafts, MD       . sodium chloride (PF) 0.9 % injection 3 mL  3 mL Intravenous Q8H Astrid Drafts, MD   3 mL at 07/04/17 0800   . spironolactone (ALDACTONE) tablet 25 mg  25 mg Oral Enedina Finner, MD   25 mg at 07/04/17 0120   . warfarin (COUMADIN) tablet 5 mg  5 mg Oral Daily Astrid Drafts, MD   5 mg at 07/04/17 0903   .  warfarin therapy placeholder (COUMADIN therapy placeholder) 1 each  1 each Does not apply See Admin Instructions Astrid Drafts, MD           Labs and Imaging:                                                              [facility]       RECENT LABS (from the last 7 days)    Recent Labs  Lab 07/04/17  0505 07/03/17  1939   WBC 8.0 11.4*   RBC 4.67 4.77   Hemoglobin 14.1 15.1   Hematocrit 44.1 45.2   MCV 94 95   PLT CT 214 259       Recent Labs  Lab 07/04/17  0505 07/03/17  1939   PT 18.9* 18.7*   PT INR 1.9* 1.9*       Recent Labs  Lab 07/03/17  2213 07/03/17  1939 07/03/17  1025   Troponin I 0.03* 0.02 0.02      Recent Labs  Lab 07/04/17  0505 07/03/17  1939 07/03/17  1025   Glucose 103*  --  126*   Sodium 137  --  138   Potassium 3.8  --  4.7   Chloride 104  --  106   CO2 24.5  --  25.0   BUN 28*  --  29*   Creatinine 1.41* 1.66* 1.55*   EGFR 50* 41* 44*   Calcium 8.7  --  9.3                  Invalid input(s):  AMORPHOUSUA   No results found for: [id number]         Microbiology, reviewed and are significant for:  Microbiology Results     None           Imaging:  Xr Chest 2 Views    Result Date: 07/03/2017  Cardiomegaly and small pleural effusions consistent with mild congestive failure. ReadingStation:[id number]        Disposition:                                                                       [facility]            Today's date: 07/04/2017  Length of Stay: 1    Signed by: Astrid Drafts, MD    Total time spent with the patient/family, counseling and/or coordination of care with other physicians, other health care professionals is 35 minutes.   I have addressed the principle problem (listed above), the active hospital problems (listed above), discharge planning issues, the results of radiology tests, the results of laboratory tests and medications (current therapy and side effects).   Greater than 50% of the time was spent counseling and coordinating care.    [employer], [place]  [address]  [place], [place]

## 2017-07-04 NOTE — Progress Notes (Signed)
Nutrition Therapy  Nutrition Assessment    Patient Information:     Name:Christopher Linwood Dibbles Sr.   Age: 72 y.o.   Sex: male     MRN: [id number]    Recommendation:     1. Continue cardiac diet  2. Add Ensure BID with meals  3. Add Boost Pudding with lunch    Nutrition History:     Screen - MST 3, unsure wt loss, poor PO    Admitted for CHF exacerbation, presenting with SOB x3 days. PMH afib, arthritis, cardiomyopathy, COPD, HTN, R ventricular apical thrombus (04/13/17), tobacco abuse.    Pt currently on a cardiac diet. Pt reports not feeling very hungry at times, usually grazes throughout the day while at home. Per RN, pt ate 100% of breakfast. Pt willing to try Ensure and Boost Pudding.    Wt history limited, shows some fluctuation but overall stable. Pt states he has lost ~15 lbs in the last month.    Nutrition Focus Physical Exam: Normal    Nutrition Risk Level: Moderate    Nutrition Diagnosis:     Unintentional weight loss related to acute illness as evidenced by poor appetite, and self-reported wt loss.       Monitoring:  Evaluation:    PO/EN/PN intake:  Total energy intake and Amount of food    Labs:  Electrolyte Profile, Renal Profile and Glucose, casual    GI Profile:  Bowel Function   Nutrition Focused Physical:  Overall appearance       Assessment Data:     Admission Dx:  Shortness of breath [R06.02]  Persistent atrial fibrillation [I48.1]  Acute on chronic congestive heart failure, unspecified heart failure type [I50.9]  PMH:  has a past medical history of Arthritis; Atrial fibrillation; Cardiomyopathy; Chronic obstructive pulmonary disease; Hypertension; and 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  PSH:  has a past surgical history that includes LIFT, ARM (MEDICAL); Knee arthroscopy w/ ACL reconstruction; Appendectomy; COLONOSCOPY, POLYPECTOMY (12/16/2012); and EGD, BIOPSY (12/16/2012).     Height: 1.829 m (6')   Weight: 93.6 kg (206 lb 5.6 oz)   BMI: Body mass index is 27.99 kg/m.   IBW: 81 kg  (178 lbs)    Weight Monitoring Weight Weight Method   04/13/2017 95.255 kg Stated   04/13/2017 96.707 kg Standing Scale   04/13/2017 92.7 kg    04/14/2017 91.218 kg Standing Scale   07/03/2017 97.1 kg    07/03/2017 93.6 kg Standing Scale     Pertinent Meds: colace, pepcid, lasix, pericolace, lopressor, coumadin  Pertinent Labs:    Recent Labs  Lab 07/04/17  0505 07/03/17  1939 07/03/17  1025   Sodium 137  --  138   Potassium 3.8  --  4.7   Chloride 104  --  106   CO2 24.5  --  25.0   BUN 28*  --  29*   Creatinine 1.41* 1.66* 1.55*   Glucose 103*  --  126*   Calcium 8.7  --  9.3          Diet Order:  Orders Placed This Encounter   Procedures   . Diet cardiac        GI symptoms:    +BM pta  Hydration: WDL  I/O:  Reviewed, -1.6L since admission  Skin:  No issues noted      Food Security Issues:  No      Learning Needs:  No education needs at this time    Estimated Needs:  Estimated  Energy Needs  Total Energy Estimated Needs: 2025-2430 kcal  Method for Estimating Needs: 25-30 kcal/kg IBW 81 kg  Estimated Protein Needs  Total Protein Estimated Needs: 97-122 g  Method for Estimating Needs: 1.2-1.5 g/kg IBW 81 kg       Additional Comments:       Norvel Richards Cioccio  07/04/2017 8:29 AM

## 2017-07-04 NOTE — Progress Notes (Signed)
[facility]   [address]   [place] [address]     Case Manager    Patient identified as a Moderate Risk for readmission based on the current risk score  Estimated D/C Date: Not documented by MD   Risk Score: Moderate, 15%   RX Coverage:   Yes through [employer] D/C Medication Program: No, prefers [facility]   Confirmed PCP with patient: name/last visit: Yes; Joglekar/ 2 weeks   Confirmed Transportation to F/u Appt: Yes, drives   PCP F/U Appt request is placed in the Navigator: AAs make appointments per pilot     Anticipated DME needed for D/C: No   Anticipated HH, arrange if appropriate: Declined   Anticipated Placement:  Referral to DCP: No   Inpatient Plan of Care:    Adm with CHF. IV diuresing with lasix continues. Daily weights. Strict I/O. Coumadin continues for chronic afib. Taking coumadin PTA, managed by coumadin clinic. Pt on room air. Plan to [place] home to spouse once medically cleared, declined HH. CM following     CM Interventions:    Electronic chart reviewed. CM assessment completed. CM spoke with pt to discuss [place] plan and [place] needs.   Plan of care discussed with RN          07/04/17 1239   Patient Type   Within 30 Days of Previous Admission? No   Healthcare Decisions   Interviewed: Patient   Orientation/Decision Making Abilities of Patient Alert and Oriented x3, able to make decisions   Prior to admission   Prior level of function Independent with ADLs;Ambulates independently   Type of Residence Private residence   Home Layout One level;Stairs to enter with rails (add number in comment)  (3 steps to enter single level home with finished basement)   Have running water, electricity, heat, etc? Yes   Living Arrangements Spouse/significant other   How do you get to your MD appointments? drives   How do you get your groceries? drives   Who fixes your meals? self/spouse/son   Who does your laundry? self/spouse/son   Who picks up your prescriptions? self   Dressing Independent   Grooming  Independent   Feeding Independent   Bathing Independent   Toileting Independent   Home Care/Community Services None   Discharge Planning   Support Systems Spouse/significant other;Children   Patient expects to be discharged to: Home   Anticipated [place] plan discussed with: Same as interviewed   Mode of transportation: Private car (family member)   Consults/Providers   Correct PCP listed in Epic? Yes     Calla Kicks, RN, BSN  Case Manager  [phone]  [phone]: fax

## 2017-07-05 LAB — CBC AND DIFFERENTIAL
Basophils %: 0.7 % (ref 0.0–3.0)
Basophils Absolute: 0.1 10*3/uL (ref 0.0–0.3)
Eosinophils %: 3.4 % (ref 0.0–7.0)
Eosinophils Absolute: 0.3 10*3/uL (ref 0.0–0.8)
Hematocrit: 44.7 % (ref 39.0–52.5)
Hemoglobin: 14.5 gm/dL (ref 13.0–17.5)
Lymphocytes Absolute: 1.7 10*3/uL (ref 0.6–5.1)
Lymphocytes: 18 % (ref 15.0–46.0)
MCH: 31 pg (ref 28–35)
MCHC: 33 gm/dL (ref 32–36)
MCV: 95 fL (ref 80–100)
MPV: 7.8 fL (ref 6.0–10.0)
Monocytes Absolute: 0.8 10*3/uL (ref 0.1–1.7)
Monocytes: 9.2 % (ref 3.0–15.0)
Neutrophils %: 68.7 % (ref 42.0–78.0)
Neutrophils Absolute: 6.3 10*3/uL (ref 1.7–8.6)
PLT CT: 224 10*3/uL (ref 130–440)
RBC: 4.71 10*6/uL (ref 4.00–5.70)
RDW: 13.9 % (ref 11.0–14.0)
WBC: 9.2 10*3/uL (ref 4.0–11.0)

## 2017-07-05 LAB — BASIC METABOLIC PANEL
Anion Gap: 14.3 mMol/L (ref 7.0–18.0)
BUN / Creatinine Ratio: 17.8 Ratio (ref 10.0–30.0)
BUN: 30 mg/dL — ABNORMAL HIGH (ref 7–22)
CO2: 27.9 mMol/L (ref 20.0–30.0)
Calcium: 8.8 mg/dL (ref 8.5–10.5)
Chloride: 100 mMol/L (ref 98–110)
Creatinine: 1.69 mg/dL — ABNORMAL HIGH (ref 0.80–1.30)
EGFR: 40 mL/min/{1.73_m2} — ABNORMAL LOW (ref 60–150)
Glucose: 114 mg/dL — ABNORMAL HIGH (ref 71–99)
Osmolality Calc: 283 mOsm/kg (ref 275–300)
Potassium: 4.2 mMol/L (ref 3.5–5.3)
Sodium: 138 mMol/L (ref 136–147)

## 2017-07-05 LAB — PT/INR
PT INR: 1.9 — ABNORMAL HIGH (ref 0.5–1.3)
PT: 19.1 s — ABNORMAL HIGH (ref 9.5–11.5)

## 2017-07-05 MED ORDER — FUROSEMIDE 40 MG PO TABS
40.00 mg | ORAL_TABLET | Freq: Every day | ORAL | Status: DC
Start: 2017-07-05 — End: 2017-07-05
  Administered 2017-07-05: 10:00:00 40 mg via ORAL
  Filled 2017-07-05 (×2): qty 1

## 2017-07-05 MED ORDER — LISINOPRIL 10 MG PO TABS
10.00 mg | ORAL_TABLET | Freq: Every day | ORAL | 0 refills | Status: DC
Start: 2017-07-06 — End: 2018-05-18

## 2017-07-05 MED ORDER — CARVEDILOL 25 MG PO TABS
25.00 mg | ORAL_TABLET | Freq: Two times a day (BID) | ORAL | 0 refills | Status: DC
Start: 2017-07-05 — End: 2018-03-02

## 2017-07-05 MED ORDER — FUROSEMIDE 40 MG PO TABS
40.00 mg | ORAL_TABLET | Freq: Every day | ORAL | 0 refills | Status: DC
Start: 2017-07-06 — End: 2018-03-04

## 2017-07-05 NOTE — Discharge Instructions (Signed)
Discharge Instructions for Atrial Fibrillation  You have been diagnosed with an abnormal heart rhythm called atrial fibrillation.With this condition, your heart's 2 upper chambers quiver rather than squeeze the blood out in a normal pattern. This leads to an irregular and sometimes rapid heartbeat. Some people will develop associated symptoms such as a flip-flopping heartbeat, chest pain, lightheadedness, or shortness of breath. Other people may have no symptoms at all. Atrial fibrillation is serious because it affects the heart's ability to fill with blood as it should. Blood clots may form. This increases the risk for stroke. Untreated atrial fibrillation can also lead to heart failure. Atrial fibrillation can be controlled. Withtreatment, mostpeople with atrial fibrillation lead normal lives.  Treatment options  Recommended treatment for atrial fibrillation depends on your age, symptoms, how long you have had atrial fibrillation, and other factors. You will havea complete evaluation to find out if you have any abnormalities that caused your heart to go into atrial fibrillation. This might be blocked heart arteries or a thyroid problem. Your doctor will assess your particular case and discuss choices with you.  Treatment choices may include:   Treating an underlyingdisorder that puts you at risk for atrial fibrillation. For example, correcting an abnormal thyroid or electrolyte problem, or treating a blocked heart artery.   Restoring a normal heart rhythm with an electrical shock (cardioversion) or with an antiarrhythmic medicine (chemical cardioversion).   Using medicine to control your heart rate in atrial fibrillation.   Preventing therisk for blood clot and stroke using blood-thinning medicines. Your doctor will tell you what he or sherecommends. Choices may include aspirin, clopidogrel, warfarin, dabigatran, rivaroxaban, apixaban, and edoxaban.   Doing catheter ablation or a surgical maze  procedure. Theseuse different methods to destroy certain areas of heart tissue. This interrupts the electrical signals causing atrial fibrillation.One of these procedures may be a choice whenmedicines do not work, or as an alternative to long-term medicine.   Other treatment choices may be recommended for you by your doctor.  Managing risk factors for stroke and preventing heart failure are important parts of anytreatment plan for atrial fibrillation.  Home care   Take your medicines exactly as directed. Don't skip doses.   Work with your doctor to find the right medicines and doses for you.   Learn to take your own pulse. Keep a record of your results. Ask your doctor which pulse rates mean that you need medical attention. Slowing your pulse is often the goal of treatment. Ask your doctor if it's OK for you to use an automatic machine to check your pulse at home. Sometimes these machines don't count the pulse correctly when you have atrial fibrillation.   Limit your intake of coffee, tea, cola, and other beverages with caffeine. Talkwith your doctor about whether you should eliminate caffeine.   Avoid over-the-counter medicines that have caffeine in them.   Let your doctor know what medicines you take, including prescription and over-the-counter medicines, as well as any supplements. They interfere with some medicines given for atrial fibrillation.   Ask your doctor about whetheryou can drink alcohol. Some people need to avoidalcoholto better treat atrial fibrillation. If you are taking blood-thinner medicines, alcohol may interfere with them by increasing their effect.   Never take stimulants such as amphetamines or cocaine. These drugs can speed upyour heart rate and trigger atrial fibrillation.  Follow-up care  Follow up with your doctor, or as advised.    When should I call my healthcare provider  Call   your healthcare provider right away if you have any of the  following:   Weakness   Dizziness   Fainting   Fatigue   Shortness of breath   Chest pain with increased activity   A change in the usual regularity of your heartbeat, or an unusually fast heartbeat   Date Last Reviewed: 08/20/2014   2000-2018 [employer] [address], [place] [address]. All rights reserved. This information is not intended as a substitute for professional medical care. Always follow your healthcare professional's instructions.

## 2017-07-05 NOTE — Plan of Care (Addendum)
10:57 AM  Assumed care of pt this AM. Pt A/Ox4. VSS. Pt denies pain or needs. Pt in bed with bed alarm on.       Problem: Ineffective Gas Exchange  Goal: Effective breathing pattern   07/05/17 1056   Goal/Interventions addressed this shift   Effective breathing pattern Maintain O2 saturation level per LIP order;Maintain CO2 level per LIP order;Monitor end tidal CO2 level per LIP order;Teach/reinforce use of ordered respiratory interventions (ie. CPAP, BiPAP, Incentive Spirometer, Acapella);Monitor for medication induced respiratory depression;Monitor for sleep apnea     Pt adm with afib, sob and leg edema, pt started on IV lasix, Pt ambulates around room, IV lasix switched to PO

## 2017-07-05 NOTE — Discharge Summary (Addendum)
DISCHARGE SUMMARY - [facility]    Patient Name: Christopher Dickerson, Christopher SR.  Attending Physician: No att. providers found  Primary Care Physician: Gayla Medicus, MD    Date of Admission: 07/03/2017  Date of Discharge: 07/05/2017    Discharge Diagnoses:                                                         [facility]       Acute systolic congestive heart failure  Atrial fibrillation chronic  Right ventricular thrombus  Chronic obstructive pulmonary disease   hypertension  Tobacco abuse      Hospital Course:                               [facility] Christopher Tober Sr. is a 72 y.o. male with PMHx of Cardiomyopathy and chronic atrial fibrillation who presents to the hospital with dyspnea, orthopnea and PND.  Apparently patient was not on any diuretics, was on  metoprolol and spironolactone.  Upon evaluation in the ER he was found to have pulmonary edema with pleural effusion suggestive of CHF. He was started on diuresis with IV Lasix in the emergency room with which his symptoms started improving and is admitted for further evaluation,       (Please see admission History and Physical for details.)    Acute systolic congestive heart failure   Known cardiomyopathy with ejection fraction of 15%  Christopher Dickerson was admitted to telemetry floor  He was maintained on diuresis with IV Lasix 40 mg twice a day,  Monitored daily body weight and urine output he has lost about 7 kg with diuresis and is down to 90 kg which seems to be his dry weight.  Patient was lasix at home. he was maintained on spironolactone and his beta blockers were changed to coreg. His orthopnea and PND has resolved and patient is not able to lie down flat. He will be discharged home with Lasix 40 mg daily. He was advised to check his weight daily, and take an extra dose of Lasix if He is to have gained more than 3 pounds in a day. ACE inhibitor's were added as well    Atrial fibrillation chronic  -   patient had transient  episode of tachycardia with atrial fibrillation which resolved spontaneously. He was maintained on Coreg  And warfarin. With close monitoring of his INR        History of right apical ventricular thrombus-  on anticoagulation with Coumadin    arf  with CkD stage II-  elevated serum creatinine and urine likely due to hypoperfusion  /  cardiorenal syndrome in view of decompensated heart failure, serum creatinine initially improved with diuresis.      Chronic obstructive pulmonary disease- stable, patient continues to smoke, denies any wheezing    Hypertension-  Uncontrolled,   added lisinopril,    tobacco abuse- counseled to quit         Discharge Day Physical Exam:  Temp:  [97 F (36.1 C)-98.1 F (36.7 C)] 97.2 F (36.2 C)  Heart Rate:  [56-100] 56  Resp Rate:  [16-18] 18  BP: (104-145)/(70-89) 114/74  Body mass index is 27.06 kg/m.  General: awake, alert, oriented x 3; no acute distress.  HEENT: perrla, eomi, sclera anicteric  oropharynx clear without lesions, mucous membranes moist  Glands: No cervical or axillary lymphadenopathy.  Neck: supple, no lymphadenopathy, no thyromegaly, no JVD, no carotid bruits  Cardiovascular: regular rate and rhythm, no rubs or gallops  Lungs: clear to auscultation bilaterally, without wheezing, rhonchi, or rales  Abdomen: soft, non-tender, non-distended; no palpable masses, no hepatosplenomegaly, normoactive bowel sounds, no rebound or guarding  Extremities: no clubbing, cyanosis, or edema  Neuro: cranial nerves grossly intact, strength 5/5 in upper and lower extremities, sensation intact,   Psych: Normal affect, not depressed   Skin: no rashes or lesions noted    Discharge Instructions:                                                 [facility]        Diet:2 gm Sodium diet    Activity/Weight Bearing Status: As tolerated.    Elenor QuinonesSeward Speck, MD  [address]  [place] [address]  [phone]    Schedule an appointment as soon as possible for a  visit in 2 week(s)      Joglekar, Lysbeth Penner, MD  [address]  [place] [address]  [phone]    Schedule an appointment as soon as possible for a visit in 1 week(s)  hospital follow up with PCP      Complete instructions and follow up are in the patient's After Visit Summary (AVS).    Discharge Medications:                                                   [facility]          Medication List      START taking these medications    carvedilol 25 MG tablet  Commonly known as:  COREG  Take 1 tablet (25 mg total) by mouth every 12 (twelve) hours.     furosemide 40 MG tablet  Commonly known as:  LASIX  Take 1 tablet (40 mg total) by mouth daily.  Start taking on:  07/06/2017     lisinopril 10 MG tablet  Commonly known as:  PRINIVIL,ZESTRIL  Take 1 tablet (10 mg total) by mouth daily.  Start taking on:  07/06/2017        CONTINUE taking these medications    omeprazole 40 MG capsule  Commonly known as:  PriLOSEC     spironolactone 25 MG tablet  Commonly known as:  ALDACTONE     Vitamin D3 400 units Caps     warfarin 5 MG tablet  Commonly known as:  COUMADIN        STOP taking these medications    metoprolol succinate XL 50 MG 24 hr tablet  Commonly known as:  TOPROL-XL           Where to Get Your Medications      [id number], [place] - [address]  [address], [place] [address]    Phone:  [phone]    carvedilol 25 MG tablet   furosemide 40 MG  tablet   lisinopril 10 MG tablet           Consultations:                                                                    [facility]         Treatment Team: Consulting Physician: Astrid Drafts, MD; Registered Nurse: Guadlupe Spanish, RN; Technician: Fabio Neighbors, CNA    Discharge Condition:                                                     [facility]       The patient was discharged in stable condition.    Total time spent in counseling and/or coordination of discharge plan  with other physicians, other health care professionals, patient and/or family is 45 minutes.    Signed by: Astrid Drafts, MD    [facility]  [address]  [place], [place]    CC: Gayla Medicus, MD

## 2017-07-06 MED ORDER — DOCUSATE SODIUM 100 MG CAPSULE
100.00 mg | ORAL_CAPSULE | ORAL | Status: DC
Start: 2017-07-06 — End: 2017-07-06

## 2017-07-06 MED ORDER — CARVEDILOL 25 MG TABLET
25.00 mg | ORAL_TABLET | ORAL | Status: DC
Start: 2017-07-05 — End: 2017-07-06

## 2017-07-06 MED ORDER — LISINOPRIL 10 MG TABLET
10.00 mg | ORAL_TABLET | ORAL | Status: DC
Start: 2017-07-06 — End: 2017-07-06

## 2017-07-06 MED ORDER — GENERIC EXTERNAL MEDICATION
0.40 mg | Status: DC
Start: ? — End: 2017-07-06

## 2017-07-06 MED ORDER — FUROSEMIDE 40 MG TABLET
40.00 mg | ORAL_TABLET | ORAL | Status: DC
Start: 2017-07-06 — End: 2017-07-06

## 2017-07-06 MED ORDER — METOPROLOL TARTRATE 5 MG/5 ML INTRAVENOUS SOLUTION
5.00 mg | INTRAVENOUS | Status: DC
Start: ? — End: 2017-07-06

## 2017-07-08 ENCOUNTER — Ambulatory Visit: Payer: Medicare PPO

## 2017-07-08 NOTE — Progress Notes (Signed)
Anticoagulation Progress Report   July 08, 2017  Name: Christopher Dickerson, Christopher Dickerson    Medical Record Number: [id number]  Reason for Treatment: Atrial fibrillation chronic     INR Range: 2.0-3.0    Date: 2017-07-08 16:19  Location: [facility]    Update Note: Attempted to call patient to reschedule today's missed   appointment, no answer and no way to leave a message.     Submitted by Lowanda Foster RN

## 2017-07-10 NOTE — Progress Notes (Signed)
Anticoagulation Progress Report   July 10, 2017  Name: Christopher Dickerson, Christopher Dickerson    Medical Record Number: [id number]  Reason for Treatment: Atrial fibrillation chronic     INR Range: 2.0-3.0    Date: 2017-07-10 15:45  Location: [facility]    Update Note: Postcard mailed to patient asking that he contact the clinic   and reschedule his missed appointment as soon as possible.     Submitted by Lowanda Foster RN

## 2017-07-22 NOTE — Progress Notes (Signed)
Anticoagulation Progress Report   July 22, 2017  Name: Christopher Dickerson, Christopher Dickerson    Medical Record Number: [id number]  Reason for Treatment: Atrial fibrillation chronic     INR Range: 2.0-3.0    Date: 2017-07-22 16:14  Location: [facility]    Update Note: Postcard #2 mailed to patient requesting that he contact the   clinic and reschedule his missed appointment as soon as possible.     Submitted by Lowanda Foster RN

## 2017-08-22 NOTE — Progress Notes (Signed)
Anticoagulation Progress Report   August 22, 2017  Name: JERRAN, TAPPAN    Medical Record Number: [id number]  Reason for Treatment: Atrial fibrillation chronic     INR Range: 2.0-3.0    Date: 2017-08-22 09:18  Location: [facility]    Update Note: No appointment in Epic. Letter mailed to patient requesting   that he contact the clinic prior to May 22nd or he will be discharged from   our services at that time.     Submitted by Lowanda Foster RN

## 2017-08-22 NOTE — Progress Notes (Signed)
Anticoagulation Progress Report   August 22, 2017  Name: Christopher Dickerson, Christopher Dickerson    Medical Record Number: [id number]  Reason for Treatment: Atrial fibrillation chronic     INR Range: 2.0-3.0    Date: 2017-08-22 09:21  Location: [facility]    Update Note: Copy faxed to referring provider also.    Submitted by Lowanda Foster RN

## 2017-09-02 ENCOUNTER — Telehealth: Payer: Self-pay

## 2017-09-02 NOTE — Telephone Encounter (Signed)
Current PCP note in chart and current labs have been requested

## 2017-09-04 NOTE — Telephone Encounter (Signed)
RECENT LABS IN EPIC

## 2017-09-08 ENCOUNTER — Encounter: Payer: Self-pay | Admitting: Cardiovascular Disease

## 2017-09-17 NOTE — Progress Notes (Signed)
Anticoagulation Progress Report   Sep 17, 2017  Name: Christopher Dickerson, Christopher Dickerson    Medical Record Number: [id number]  Reason for Treatment: Atrial fibrillation chronic     INR Range: 2.0-3.0    Date: 2017-09-17 07:51  Location: [facility]    Update Note: No response from patient to the letter mailed to patient April   26th requesting that he contact the clinic prior to today and no   appointment in Epic. Will archive at this time and notify the referring   provider.     Submitted by Lowanda Foster RN

## 2018-03-02 ENCOUNTER — Inpatient Hospital Stay
Admission: EM | Admit: 2018-03-02 | Discharge: 2018-03-04 | DRG: 310 | Disposition: A | Discharge status: Home / Self Care | Payer: Medicare PPO | Source: Ambulatory Visit | Attending: Internal Medicine | Admitting: Internal Medicine

## 2018-03-02 ENCOUNTER — Emergency Department: Payer: Medicare PPO

## 2018-03-02 DIAGNOSIS — F17210 Nicotine dependence, cigarettes, uncomplicated: Secondary | ICD-10-CM | POA: Diagnosis present

## 2018-03-02 DIAGNOSIS — I4819 Other persistent atrial fibrillation: Principal | ICD-10-CM | POA: Diagnosis present

## 2018-03-02 DIAGNOSIS — R000 Tachycardia, unspecified: Secondary | ICD-10-CM

## 2018-03-02 DIAGNOSIS — Z7901 Long term (current) use of anticoagulants: Secondary | ICD-10-CM

## 2018-03-02 DIAGNOSIS — I10 Essential (primary) hypertension: Secondary | ICD-10-CM | POA: Diagnosis present

## 2018-03-02 DIAGNOSIS — I513 Intracardiac thrombosis, not elsewhere classified: Secondary | ICD-10-CM | POA: Diagnosis present

## 2018-03-02 DIAGNOSIS — I4891 Unspecified atrial fibrillation: Secondary | ICD-10-CM

## 2018-03-02 DIAGNOSIS — Z9049 Acquired absence of other specified parts of digestive tract: Secondary | ICD-10-CM

## 2018-03-02 DIAGNOSIS — Z885 Allergy status to narcotic agent status: Secondary | ICD-10-CM

## 2018-03-02 DIAGNOSIS — J449 Chronic obstructive pulmonary disease, unspecified: Secondary | ICD-10-CM | POA: Diagnosis present

## 2018-03-02 DIAGNOSIS — Z86010 Personal history of colonic polyps: Secondary | ICD-10-CM

## 2018-03-02 DIAGNOSIS — I420 Dilated cardiomyopathy: Secondary | ICD-10-CM | POA: Diagnosis present

## 2018-03-02 LAB — CBC AND DIFFERENTIAL
Basophils %: 0.6 % (ref 0.0–3.0)
Basophils Absolute: 0.1 10*3/uL (ref 0.0–0.3)
Eosinophils %: 3.3 % (ref 0.0–7.0)
Eosinophils Absolute: 0.3 10*3/uL (ref 0.0–0.8)
Hematocrit: 48.5 % (ref 39.0–52.5)
Hemoglobin: 16.4 gm/dL (ref 13.0–17.5)
Lymphocytes Absolute: 1.4 10*3/uL (ref 0.6–5.1)
Lymphocytes: 13 % — ABNORMAL LOW (ref 15.0–46.0)
MCH: 33 pg (ref 28–35)
MCHC: 34 gm/dL (ref 32–36)
MCV: 97 fL (ref 80–100)
MPV: 8.1 fL (ref 6.0–10.0)
Monocytes Absolute: 0.9 10*3/uL (ref 0.1–1.7)
Monocytes: 8.8 % (ref 3.0–15.0)
Neutrophils %: 74.3 % (ref 42.0–78.0)
Neutrophils Absolute: 7.7 10*3/uL (ref 1.7–8.6)
PLT CT: 235 10*3/uL (ref 130–440)
RBC: 4.98 10*6/uL (ref 4.00–5.70)
RDW: 13.2 % (ref 11.0–14.0)
WBC: 10.4 10*3/uL (ref 4.0–11.0)

## 2018-03-02 LAB — ECG 12-LEAD
Patient Age: 71 years
Q-T Interval(Corrected): 457 ms
Q-T Interval: 314 ms
QRS Axis: 24 deg
QRS Duration: 108 ms
T Axis: 109 years
Ventricular Rate: 127 //min

## 2018-03-02 LAB — COMPREHENSIVE METABOLIC PANEL
ALT: 16 U/L (ref 0–55)
AST (SGOT): 19 U/L (ref 10–42)
Albumin/Globulin Ratio: 1.06 Ratio (ref 0.80–2.00)
Albumin: 3.6 gm/dL (ref 3.5–5.0)
Alkaline Phosphatase: 95 U/L (ref 40–145)
Anion Gap: 14.5 mMol/L (ref 7.0–18.0)
BUN / Creatinine Ratio: 18.6 Ratio (ref 10.0–30.0)
BUN: 24 mg/dL — ABNORMAL HIGH (ref 7–22)
Bilirubin, Total: 0.6 mg/dL (ref 0.1–1.2)
CO2: 25 mMol/L (ref 20–30)
Calcium: 9.1 mg/dL (ref 8.5–10.5)
Chloride: 106 mMol/L (ref 98–110)
Creatinine: 1.29 mg/dL (ref 0.80–1.30)
EGFR: 55 mL/min/{1.73_m2} — ABNORMAL LOW (ref 60–150)
Globulin: 3.4 gm/dL (ref 2.0–4.0)
Glucose: 99 mg/dL (ref 71–99)
Osmolality Calculated: 285 mOsm/kg (ref 275–300)
Potassium: 4.5 mMol/L (ref 3.5–5.3)
Protein, Total: 7 gm/dL (ref 6.0–8.3)
Sodium: 141 mMol/L (ref 136–147)

## 2018-03-02 LAB — PT AND APTT
PT INR: 1.4 — ABNORMAL HIGH (ref 0.5–1.3)
PT: 14.3 s — ABNORMAL HIGH (ref 9.5–11.5)
aPTT: 29 s (ref 24.0–34.0)

## 2018-03-02 LAB — TROPONIN I
Troponin I: 0.02 ng/mL (ref 0.00–0.02)
Troponin I: 0.02 ng/mL (ref 0.00–0.02)
Troponin I: 0.02 ng/mL (ref 0.00–0.02)

## 2018-03-02 MED ORDER — LISINOPRIL 10 MG PO TABS
10.00 mg | ORAL_TABLET | Freq: Every day | ORAL | Status: DC
Start: 2018-03-03 — End: 2018-03-04
  Administered 2018-03-03 – 2018-03-04 (×2): 10 mg via ORAL
  Filled 2018-03-02 (×2): qty 1

## 2018-03-02 MED ORDER — ACETAMINOPHEN 325 MG PO TABS
650.00 mg | ORAL_TABLET | ORAL | Status: DC | PRN
Start: 2018-03-02 — End: 2018-03-04

## 2018-03-02 MED ORDER — ENOXAPARIN SODIUM 100 MG/ML SC SOLN
1.00 mg/kg | Freq: Two times a day (BID) | SUBCUTANEOUS | Status: DC
Start: 2018-03-02 — End: 2018-03-03
  Administered 2018-03-02 – 2018-03-03 (×2): 100 mg via SUBCUTANEOUS
  Filled 2018-03-02 (×3): qty 1

## 2018-03-02 MED ORDER — DILTIAZEM HCL 30 MG PO TABS
60.00 mg | ORAL_TABLET | Freq: Once | ORAL | Status: AC
Start: 2018-03-02 — End: 2018-03-02
  Administered 2018-03-02: 16:00:00 60 mg via ORAL

## 2018-03-02 MED ORDER — FAMOTIDINE 20 MG PO TABS
20.00 mg | ORAL_TABLET | Freq: Two times a day (BID) | ORAL | Status: DC
Start: 2018-03-02 — End: 2018-03-04
  Administered 2018-03-02 – 2018-03-04 (×4): 20 mg via ORAL
  Filled 2018-03-02 (×5): qty 1

## 2018-03-02 MED ORDER — ACETAMINOPHEN 160 MG/5ML PO SOLN
650.00 mg | ORAL | Status: DC | PRN
Start: 2018-03-02 — End: 2018-03-04

## 2018-03-02 MED ORDER — DILTIAZEM HCL 5 MG/ML IV SOLN (WRAP)
INTRAVENOUS | Status: AC
Start: 2018-03-02 — End: ?
  Filled 2018-03-02: qty 5

## 2018-03-02 MED ORDER — DILTIAZEM HCL 5 MG/ML IV SOLN (WRAP)
20.00 mg | Freq: Once | INTRAVENOUS | Status: AC
Start: 2018-03-02 — End: 2018-03-02
  Administered 2018-03-02: 15:00:00 20 mg via INTRAVENOUS

## 2018-03-02 MED ORDER — DILTIAZEM HCL 30 MG PO TABS
ORAL_TABLET | ORAL | Status: AC
Start: 2018-03-02 — End: ?
  Filled 2018-03-02: qty 2

## 2018-03-02 MED ORDER — SODIUM CHLORIDE 0.9 % IJ SOLN
3.00 mL | Freq: Three times a day (TID) | INTRAMUSCULAR | Status: DC
Start: 2018-03-02 — End: 2018-03-04
  Administered 2018-03-02 – 2018-03-04 (×5): 3 mL via INTRAVENOUS

## 2018-03-02 MED ORDER — FUROSEMIDE 10 MG/ML IJ SOLN
40.00 mg | Freq: Two times a day (BID) | INTRAMUSCULAR | Status: DC
Start: 2018-03-03 — End: 2018-03-03
  Administered 2018-03-03 (×2): 40 mg via INTRAVENOUS
  Filled 2018-03-02 (×2): qty 4

## 2018-03-02 MED ORDER — DILTIAZEM HCL 60 MG PO TABS
60.00 mg | ORAL_TABLET | Freq: Four times a day (QID) | ORAL | Status: DC
Start: 2018-03-03 — End: 2018-03-03
  Administered 2018-03-02 – 2018-03-03 (×3): 60 mg via ORAL
  Filled 2018-03-02 (×6): qty 1

## 2018-03-02 MED ORDER — SODIUM CHLORIDE 0.9 % IJ SOLN
0.40 mg | INTRAMUSCULAR | Status: DC | PRN
Start: 2018-03-02 — End: 2018-03-04

## 2018-03-02 MED ORDER — ACETAMINOPHEN 650 MG RE SUPP
650.00 mg | RECTAL | Status: DC | PRN
Start: 2018-03-02 — End: 2018-03-04

## 2018-03-02 MED ORDER — WARFARIN SODIUM 5 MG PO TABS
5.00 mg | ORAL_TABLET | Freq: Every day | ORAL | Status: DC
Start: 2018-03-03 — End: 2018-03-03
  Filled 2018-03-02: qty 1

## 2018-03-02 NOTE — Progress Notes (Signed)
NURSE NOTE SUMMARY  [facility] - SURG TELEMETRY STEP-DOWN   Patient Name: Christopher Dickerson, Christopher SR.   Attending Physician: Astrid Drafts, MD   Today's date:   03/02/2018 LOS: 0 days   Shift Summary:                                                              6:26 PM  Patient resting comfortably in bed. Denies pain or needs right now. Patient transported to 338 from the ED. Patient states he does not want this diet he is on because "it isnt what regular people eat". Will continue to monitor    Provider Notifications:      Rapid Response Notifications:  Mobility:          Weight tracking:  Family Dynamic:   Last 3 Weights for the past 72 hrs (Last 3 readings):   Weight   03/02/18 1300 99.8 kg (220 lb)             Recent Vitals Last Bowel Movement   BP: 106/86 (03/02/2018  5:46 PM)  Heart Rate: 88 (03/02/2018  5:16 PM)  Temp: 98.7 F (37.1 C) (03/02/2018  5:46 PM)  Resp Rate: (!) 26 (03/02/2018  5:16 PM)  Height: 1.829 m (6') (03/02/2018  1:00 PM)  Weight: 99.8 kg (220 lb) (03/02/2018  1:00 PM)  SpO2: 97 % (03/02/2018  5:46 PM)   No data recorded

## 2018-03-02 NOTE — ED Provider Notes (Signed)
[facility] EMERGENCY DEPARTMENT History and Physical Exam      Patient Name: [name], [name]  Encounter Date:  03/02/2018  Attending Physician: Bernadette Hoit  PCP: Christopher Medicus, MD  Patient DOB:  1945/08/21  MRN:  [id number]  Room:  C13/C13-A      History of Presenting Illness     Chief complaint: Atrial Fibrillation    HPI/ROS is limited by: none  HPI/ROS given by: patient    Location: general  Duration: unknown  Severity: moderate    Christopher Fenton Sr. is a 72 y.o. male h/o CHF (EF 10-15%), Afib, LV thrombus, COPD, HTN, and tobacco use who presents from Urgent Care in Afib with RVR for unknown duration. Pt went to Urgent Care earlier today for evaluation of her chronic knee pain. When it was discovered the pt was in Afib with RVR he was sent to the ED for additional eval. Pt does not have any idea how long he has had a rapid heartbeat. He denies dizziness/lightheadedness, CP, SOB, palpitations, or sensation of racing heart. He is not sure whether or not he takes his medications as px'd. 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 Had 1 cup coffee and 1 cup oranje juice this am. Smokes 1/2 ppd. No recent illnesses. No fever/chills, abd pain, N/V/D, changes in bowel or bladder function, or BLE edema.      Review of Systems     Review of Systems   Constitutional: Negative for chills and fever.   HENT: Negative for congestion and sore throat.    Eyes: Negative for blurred vision, double vision and photophobia.   Respiratory: Negative for cough and shortness of breath.    Cardiovascular: Negative for chest pain, palpitations and leg swelling.   Gastrointestinal: Negative for abdominal pain, diarrhea, nausea and vomiting.   Genitourinary: Negative for dysuria, frequency and urgency.   Musculoskeletal: Positive for joint pain.   Skin: Negative for rash.   Neurological: Negative for dizziness and headaches.   All other systems reviewed and are negative.       Allergies     Pt is allergic to codeine.    Medications       Current Facility-Administered Medications:   .  dilTIAZem (CARDIZEM) injection 20 mg, 20 mg, Intravenous, Once in ED, Tomoko Sandra, Rosebud Poles, MD    Current Outpatient Medications:   .  carvedilol (COREG) 25 MG tablet, Take 1 tablet (25 mg total) by mouth every 12 (twelve) hours., Disp: 60 tablet, Rfl: 0  .  Cholecalciferol (VITAMIN D3) 400 units Cap, Take 1 capsule by mouth every morning.  , Disp: , Rfl:   .  furosemide (LASIX) 40 MG tablet, Take 1 tablet (40 mg total) by mouth daily., Disp: 30 tablet, Rfl: 0  .  lisinopril (PRINIVIL,ZESTRIL) 10 MG tablet, Take 1 tablet (10 mg total) by mouth daily., Disp: 30 tablet, Rfl: 0  .  omeprazole (PRILOSEC) 40 MG capsule, Take 40 mg by mouth every morning.  , Disp: , Rfl:   .  spironolactone (ALDACTONE) 25 MG tablet, Take 25 mg by mouth every morning.  , Disp: , Rfl:   .  warfarin (COUMADIN) 5 MG tablet, Take by mouth.  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    Past Surgical  History     Pt has a past surgical history that includes LIFT, ARM (MEDICAL); Knee arthroscopy w/ ACL reconstruction; Appendectomy; COLONOSCOPY, POLYPECTOMY (12/16/2012); and EGD, BIOPSY (12/16/2012).    Family History     The family history is not on file.    Social History     Pt reports that he has been smoking cigarettes. He has been smoking about 0.50 packs per day. He has never used smokeless tobacco. He reports current alcohol use. He reports that he does not use drugs.    Physical Exam     Blood pressure (!) 156/103, pulse (!) 133, temperature 98.8 F (37.1 C), temperature source Oral, resp. rate 20, height 1.829 m, weight 99.8 kg, SpO2 98 %.    Physical Exam  Vitals signs reviewed.   Constitutional:       Appearance: Normal appearance.   HENT:       Head: Normocephalic and atraumatic.      Nose: Nose normal.      Mouth/Throat:      Mouth: Mucous membranes are dry.      Pharynx: Oropharynx is clear.   Eyes:      Extraocular Movements: Extraocular movements intact.      Pupils: Pupils are equal, round, and reactive to light.   Neck:      Musculoskeletal: Normal range of motion. No muscular tenderness.   Cardiovascular:      Rate and Rhythm: Tachycardia present. Rhythm irregular.   Pulmonary:      Effort: Pulmonary effort is normal. No respiratory distress.      Breath sounds: Normal breath sounds. No rhonchi.   Abdominal:      Palpations: Abdomen is soft.      Tenderness: There is no tenderness. There is no guarding or rebound.   Musculoskeletal: Normal range of motion.         General: No swelling, tenderness or deformity.   Skin:     General: Skin is warm.   Neurological:      General: No focal deficit present.      Mental Status: He is alert and oriented to person, place, and time.         Orders Placed     Orders Placed This Encounter   Procedures   . XR Chest 2 Views   . CBC and differential   . Comprehensive metabolic panel   . Troponin I (STAT)   . PT/APTT   . Cardiac Monitoring (Hard Wire)   . ECG 12 lead (Stat)       Diagnostic Results       The results of the diagnostic studies below have been reviewed by myself:    Labs  Results     Procedure Component Value Units Date/Time    Troponin I (STAT) [[id number]] Collected:  03/02/18 1325    Specimen:  Plasma Updated:  03/02/18 1406     Troponin I 0.02 ng/mL     Comprehensive metabolic panel [[id number]]  (Abnormal) Collected:  03/02/18 1325    Specimen:  Plasma Updated:  03/02/18 1358     Sodium 141 mMol/L      Potassium 4.5 mMol/L      Chloride 106 mMol/L      CO2 25 mMol/L      Calcium 9.1 mg/dL      Glucose 99 mg/dL      Creatinine 7.82 mg/dL      BUN 24 mg/dL      Protein, Total 7.0 gm/dL  Albumin 3.6 gm/dL      Alkaline Phosphatase 95 U/L      ALT 16 U/L      AST (SGOT) 19 U/L      Bilirubin, Total  0.6 mg/dL      Albumin/Globulin Ratio 1.06 Ratio      Anion Gap 14.5 mMol/L      BUN/Creatinine Ratio 18.6 Ratio      EGFR 55 mL/min/1.30m2      Osmolality Calculated 285 mOsm/kg      Globulin 3.4 gm/dL     PT/APTT [[id number]]  (Abnormal) Collected:  03/02/18 1325    Specimen:  Blood Updated:  03/02/18 1357     PT 14.3 sec      PT INR 1.4     aPTT 29.0 sec     CBC and differential [[id number]]  (Abnormal) Collected:  03/02/18 1325    Specimen:  Blood Updated:  03/02/18 1349     WBC 10.4 K/cmm      RBC 4.98 M/cmm      Hemoglobin 16.4 gm/dL      Hematocrit 78.2 %      MCV 97 fL      MCH 33 pg      MCHC 34 gm/dL      RDW 95.6 %      PLT CT 235 K/cmm      MPV 8.1 fL      NEUTROPHIL % 74.3 %      Lymphocytes 13.0 %      Monocytes 8.8 %      Eosinophils % 3.3 %      Basophils % 0.6 %      Neutrophils Absolute 7.7 K/cmm      Lymphocytes Absolute 1.4 K/cmm      Monocytes Absolute 0.9 K/cmm      Eosinophils Absolute 0.3 K/cmm      BASO Absolute 0.1 K/cmm           Radiologic Studies  Radiology Results (24 Hour)     ** No results found for the last 24 hours. **          EKG: Afib with PVCs in the 130s. No ST changes      MDM / Critical Care     Blood pressure (!) 156/103, pulse (!) 133, temperature 98.8 F (37.1 C), temperature source Oral, resp. rate 20, height 1.829 m, weight 99.8 kg, SpO2 98 %.    The patient presents to the Emergency Department with palpitations.  Treatment has been initiated in the ER, but the patient has not been stabilized or had significant improvement in symptoms.  The patient appears ill enough and/or has illness/findings/co-morbidities that make admission for IV medications, monitoring, and possible specialist consultation the most appropriate disposition. Differential diagnosis has included but is not limited to atrial tachy/brady dysrythmia, ventricular tachy/brady dysrhythmia, thyrotoxicosis, and complete heart block.  Diagnostic impression and plan were discussed and agreed upon with the  patient and/or family.  Results of lab/radiology tests were reviewed and discussed with the patient and/or family. All questions were answered and concerns addressed.  Appropriate consultation was made for admission and further treatment of this patient.    Afib 120s-130s. Improved with 20 mg IV Cardizem. Given Recent echo with EF 10-15% with LV thrombus and subtherapeutic INR feel as if patient needs to be admitted for further eval and mgmt. Spoke with hospitalist who agrees with admission.   Diagnosis / Disposition     Clinical Impression  1.  Atrial fibrillation with RVR        Disposition  ED Disposition     ED Disposition Condition Date/Time Comment    Admit  Mon Mar 02, 2018  3:04 PM           Prescriptions  New Prescriptions    No medications on file         In addition to the above history, please see nursing notes.  Allergies, meds, past medical, family, social, history and the results from the diagnostic studies performed have been reviewed by myself.     This note has been created by an Electronic Medical Record that may contain additions or subtractions not intended by myself.  Verlon Au, MD             Payton Emerald, MD  Resident  03/02/18 1504       Oretha Ellis, MD  03/05/18 [phone]

## 2018-03-02 NOTE — Plan of Care (Signed)
Problem: Moderate/High Fall Risk Score >5  Goal: Patient will remain free of falls  Outcome: Progressing

## 2018-03-02 NOTE — ED Notes (Signed)
Cardizem 20 mg IV given per MD order. Remains in Atrial fib, HR 90-100's.

## 2018-03-02 NOTE — ED Triage Notes (Signed)
Presented to ED after being seen at [facility] for left knee pain. Pt was found to be in Atrial Fibrillation, with rate 120-130's. BP's 150's/100's consistently. Denies chest pain, dyspnea, palpitations, dizziness, or weakness. Denies fevers, n/v/d.     Hx HTN, Atrial fibrillation, takes Coumadin. Pt states he has not seen MD or had INR checked "for a while."

## 2018-03-02 NOTE — Progress Notes (Signed)
NURSE NOTE SUMMARY  [facility] - SURG TELEMETRY STEP-DOWN   Patient Name: Christopher Dickerson, Christopher SR.   Attending Physician: Astrid Drafts, MD   Today's date:   03/02/2018 LOS: 0 days   Shift Summary:                                                              5956- Report received. Assumed care. Pt awake in bed. Family at bedside. No c/o pain or discomfort at this time. No distress noted. CBIR. Will continue to monitor.   Provider Notifications:      Rapid Response Notifications:  Mobility:          Weight tracking:  Family Dynamic:   Last 3 Weights for the past 72 hrs (Last 3 readings):   Weight   03/02/18 1300 99.8 kg (220 lb)             Recent Vitals Last Bowel Movement   BP: (!) 146/94 (03/02/2018  7:43 PM)  Heart Rate: 83 (03/02/2018  7:43 PM)  Temp: 97.9 F (36.6 C) (03/02/2018  7:43 PM)  Resp Rate: 17 (03/02/2018  7:43 PM)  Height: 1.829 m (6') (03/02/2018  1:00 PM)  Weight: 99.8 kg (220 lb) (03/02/2018  1:00 PM)  SpO2: 97 % (03/02/2018  7:43 PM)   Last BM Date: 03/02/18

## 2018-03-02 NOTE — Consults (Signed)
Cardiology Consultation Note       Date Time: 03/02/18 4:39 PM  Patient Name: Christopher Dickerson, Christopher Dickerson.  MRN#: [id number]  DOB: [birth year]  PCP: Gerilyn Pilgrim, MD  Attending: Oretha Ellis, MD    Primary Cardiologist: Dr Harlin Rain       Reason for Consultation:   Persistent atrial fibrillation rapid ventricular response.  Prior evidence of left atrial appendage clot.  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 RVR.   According to the previous clinical note there was a plan in place for having him undergo electrical cardioversion once he was therapeutic and INR is and had no evidence of intra-atrial appendage clot.   His INR unfortunately is subtherapeutic, I would cover him with IV heparin in the interim and readjust his Coumadin to achieve an INR of 2.   There is no indication at this point in time for electrical cardioversion given how stable he is.   Would continue to optimize his beta-blocker therapy with high-dose beta-blockade to minimize rapid ventricular response, I would not use Cardizem unless for very short periods of time.   I believe he would also benefit from a small dose of digoxin as needed.    Severe dilated cardiomyopathy, etiology unclear, NYHA class II, well compensated, euvolemic.   Again according to the clinic notes once he was optimized on medical therapy I believe there was a plan for an ischemic work-up.  At present he has no indication for it.   Resume home regimen of ACE/ARB with Aldactone as required per   He is remarkably euvolemic clinically we will check a BNP.      At present his heart rate seems to be fairly well controlled.  I will place him on Dr. Jenetta Downer rounding list for tomorrow    I thank you for the opportunity to participate in the care of this patient.     Electronically signed by: Santo Held, MD       Problem List:    Active Problems:    * No active hospital problems. *        History of Present Illness:   Christopher Dickerson. is a rather stoic relatively asymptomatic 72 year old male.  He drinks at least several beers every day, was getting his orthopedic issues evaluated in the local ER when the ER physician noted that he had a very rapid ventricular response irregularly irregular and was found to be in rapid A. fib.  Therefore transferred to the ER.    In the ER he remained asymptomatic, was given IV Cardizem to reduce his heart rate with good effect.  He remains asymptomatic albeit hypertensive at this point in time.  He does not have any complaints which is surprising, he has no orthopnea PND no lower extremity edema he denies any lack of appetite or declining appetite he denies any fluctuations or changes in his weight.  It is remarkable that he is as asymptomatic as he is given that his last LV systolic function estimated to be around 15%.    He is unclear as to the plan going forward, rather upset that he had to be admitted to the hospital.  But understands I believe the gravity of the situation and is willing to stay to get his medications optimized    EKG:   EKG: Rapid A.  fib    Review of Systems:   A comprehensive review of systems was: Otherwise negative except as stated above.    Past Medical History:     Past Medical History:   Diagnosis Date   . Arthritis    . Atrial fibrillation    . Cardiomyopathy    . Chronic obstructive pulmonary disease    . Hypertension    . 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    Secondary to Atrial Fib       Past Surgical History:    has a past surgical history that includes LIFT, ARM (MEDICAL); Knee arthroscopy w/ ACL reconstruction; Appendectomy; COLONOSCOPY, POLYPECTOMY (12/16/2012); and EGD, BIOPSY (12/16/2012).    Family History:   family history is not on file.    Social History:    reports that he has been smoking cigarettes. He has been smoking about 0.50 packs per  day. He has never used smokeless tobacco. He reports current alcohol use. He reports that he does not use drugs.    Allergies:   Codeine    Medications:               Physical Exam:     Tmax: Temp (24hrs), Avg:98.8 F (37.1 C), Min:98.8 F (37.1 C), Max:98.8 F (37.1 C)     BP: (!) 136/105   Heart Rate: 99   SpO2: 100 %    Wt Readings from Last 3 Encounters:   03/02/18 99.8 kg (220 lb)   07/04/17 90.5 kg (199 lb 8.3 oz)   04/14/17 91.2 kg (201 lb 1.6 oz)           No intake or output data in the 24 hours ending 03/02/18 1639    Constitutional -  Well appearing, and in no distress  Eyes - Pupils equal and reactive, extraocular eye movements intact, sclera anicteric  Head -  Normocephalic, atraumatic  Neck - supple, no significant adenopathy, no thyromegaly, no masses  Oropharynx - Moist mucous membranes  Respiratory - Clear to auscultation bilaterally, normal respiratory effort  Cardiovascular system -faint S1-S2-S3 heard no clicks rubs or murmurs  Abdomen - soft, nontender, nondistended, no masses or organomegaly  Neurological - Alert, oriented, no focal neurological deficits  Extremities -lower extremity edema  Skin - Warm and dry, no rashes, normal color   Psych- Appropriate affect      Labs Reviewed:     Recent Labs   Lab 03/02/18  1325   Glucose 99   BUN 24*   Creatinine 1.29   Sodium 141   Potassium 4.5   Chloride 106   CO2 25     Recent Labs   Lab 03/02/18  1325   AST (SGOT) 19   ALT 16         Recent Labs   Lab 03/02/18  1325   WBC 10.4   Hemoglobin 16.4   Hematocrit 48.5   PLT CT 235     Recent Labs   Lab 03/02/18  1325   Troponin I 0.02         No results found for: [id number]   Recent Labs   Lab 03/02/18  1325   PT INR 1.4*     Estimated Creatinine Clearance: 64.3 mL/min (based on SCr of 1.29 mg/dL).    Radiology Studies in last 24 hours:   Xr Chest Ap Portable    Result Date: 03/02/2018  1.  MILD PULMONARY VASCULAR CONGESTION. 2.  OTHERWISE NO ACUTE CARDIOPULMONARY FINDINGS GIVEN PORTABLE TECHNIQUE.  3.  CARDIOMEGALY. ReadingStation:[id number]

## 2018-03-02 NOTE — H&P (Signed)
Medicine History & Physical - [facility], [place]   Patient Name: Christopher Dickerson, Christopher SR. LOS: 0 days   Attending Physician: Oretha Ellis, MD PCP: Gerilyn Pilgrim, MD      Assessment and Plan:                                                              Atrial fibrillation with RVR  Admit to medical floor  Monitor on telemetry  Rate control with Cardizem  -already started on p.o.  , will give Cardizem IV if his heart rate is again high.  Patient apparently takes warfarin at home but his INR was 1.4  We will start him on Lovenox for bridging along with warfarin    History of cardiomyopathy with ejection fraction of 10%  History of intracardiac thrombus  We will put the patient on IV diuresis  Repeat echocardiogram  Trend troponins to rule out ACS    Hypertension-continue with lisinopril    Chronic tobacco smoking-patient was advised to quit     Chronic alcohol use-drinks 2-3 beers daily , patient will be placed on CIWA protocol       DVT PPx: lovenox   Code: full code   Active Hospital Problem List  Active Problems:    Atrial fibrillation with RVR     Subjective   History of Presenting Illness                                CC: Dyspnea, atrial fibrillation  Christopher Fenton Sr. is a 72 y.o. male patient with known history of atrial fibrillation, cardiomyopathy with encounter the urgent care with complaints of pain in his knee.  Patient says he has chronic issues in his knee.  At the urgent care he was found to have rapid heart rate and he was sent to the emergency room for atrial fibrillation.  In the ER patient was given Cardizem 20 mg IV and was started on Cardizem p.o.  His heart rate when I saw him was in 80s.  Patient denies any palpitations, chest pain or shortness of breath.  Old records reviewed patient apparently was admitted in March 2019.  He was discharged on Coumadin  .  Apparently.  He did not follow-up at the Coumadin clinic.  He did not have any  cardiology follow-ups.  He was also diagnosed to have intracardiac thrombus.  However Park, he claims that he has been taking his " warfarin along with his other medications daily.   His INR today was 1.4  Patient also continues to smoke cigarettes and drinks alcohol beer 2-3 every day.      Review of Systems:    All systems were reviewed and are negative unless pertinent positive stated in HPI.  CONSTITUTIONAL: No night sweats. No fatigue. No fever or chills.   Eyes: No visual changes. No eye pain.   ENT: No runny nose. No epistaxis.   RESPIRATORY: No cough. No hemoptysis. No shortness of breath.   CARDIOVASCULAR: No chest pains. No palpitations.   GASTROINTESTINAL: No abdominal pain. No vomiting. No diarrhea or constipation.   GENITOURINARY: No urgency. No frequency. No dysuria.   MUSCULOSKELETAL: Pain in his knees  NEUROLOGICAL: No headache or neck pain. No syncope or seizure.   PSYCHIATRIC: No depression, no psychosis.  SKIN: No rashes. No lesions. No petechiae.  ENDOCRINE: No unexplained weight loss. No polydipsia.   HEMATOLOGIC: No anemia. No purpura. No bleeding.   ALLERGIC AND IMMUNOLOGIC: No pruritus. No swelling      Objective   Physical Exam:     Vitals: T:98.8 F (37.1 C) (Oral),  BP:(!) 139/94, HR:78, RR:17, SaO2:98%    1) General Appearance: Alert and oriented x 4. In no acute distress.  Disheveled appearance  2) Eyes: Pink conjunctiva, anicteric sclera. Pupils are equally reactive to light.  3) ENT: Oral mucosa moist with no pharyngeal congestion, erythema or swelling.  4) Neck: Supple, with full range of motion. Trachea is central, no JVD noted  5) Chest: C diminished breath sounds bilaterally, no crackles or wheezing heard  5) CVS: Irregularly irregular heartbeat  6) Abdomen: Soft, non-tender, no palpable mass. Bowel sounds normal. No CVA tenderness  7) Extremities: No pitting edema, pulses palpable, no calf swelling and gross no deformity.  8) Skin: Warm, dry with normal skin turgor, no rash  9)  Lymphatics: No lymphadenopathy in axillary, cervical and inguinal area.   10) Neurological: Cranial nerves II-XII intact. No gross focal motor or sensory deficits noted.  11) Psychiatric: Affect is appropriate. No hallucinations.  Patient Vitals for the past 12 hrs:   BP Temp Pulse Resp   03/02/18 1647 (!) 139/94 - 78 17   03/02/18 1632 (!) 160/105 - 94 22   03/02/18 1534 (!) 136/105 - 99 (!) 24   03/02/18 1531 (!) 144/95 - (!) 106 21   03/02/18 1526 (!) 166/129 - (!) 141 20   03/02/18 1525 - - (!) 147 (!) 23   03/02/18 1502 (!) 144/121 - (!) 131 21   03/02/18 1447 (!) 142/105 - (!) 106 17   03/02/18 1300 (!) 156/103 98.8 F (37.1 C) (!) 133 20     Weight Monitoring 04/13/2017 04/13/2017 04/14/2017 07/03/2017 07/03/2017 07/04/2017 03/02/2018   Height 182.9 cm - - 182.9 cm - - 182.9 cm   Height Method Stated - - - - - -   Weight 96.707 kg 92.7 kg 91.218 kg 97.1 kg 93.6 kg 90.5 kg 99.791 kg   Weight Method Standing Scale - Standing Scale - Standing Scale Standing Scale -   BMI (calculated) 29 kg/m2 - - 29.1 kg/m2 - - 29.9 kg/m2          Recent Results (from the past 24 hour(s))   CBC and differential    Collection Time: 03/02/18  1:25 PM   Result Value Ref Range    WBC 10.4 4.0 - 11.0 K/cmm    RBC 4.98 4.00 - 5.70 M/cmm    Hemoglobin 16.4 13.0 - 17.5 gm/dL    Hematocrit 16.1 09.6 - 52.5 %    MCV 97 80 - 100 fL    MCH 33 28 - 35 pg    MCHC 34 32 - 36 gm/dL    RDW 04.5 40.9 - 81.1 %    PLT CT 235 130 - 440 K/cmm    MPV 8.1 6.0 - 10.0 fL    NEUTROPHIL % 74.3 42.0 - 78.0 %    Lymphocytes 13.0 (L) 15.0 - 46.0 %    Monocytes 8.8 3.0 - 15.0 %    Eosinophils % 3.3 0.0 - 7.0 %    Basophils % 0.6 0.0 - 3.0 %    Neutrophils Absolute  7.7 1.7 - 8.6 K/cmm    Lymphocytes Absolute 1.4 0.6 - 5.1 K/cmm    Monocytes Absolute 0.9 0.1 - 1.7 K/cmm    Eosinophils Absolute 0.3 0.0 - 0.8 K/cmm    BASO Absolute 0.1 0.0 - 0.3 K/cmm   Comprehensive metabolic panel    Collection Time: 03/02/18  1:25 PM   Result Value Ref Range    Sodium 141 136 - 147  mMol/L    Potassium 4.5 3.5 - 5.3 mMol/L    Chloride 106 98 - 110 mMol/L    CO2 25 20 - 30 mMol/L    Calcium 9.1 8.5 - 10.5 mg/dL    Glucose 99 71 - 99 mg/dL    Creatinine 1.91 4.78 - 1.30 mg/dL    BUN 24 (H) 7 - 22 mg/dL    Protein, Total 7.0 6.0 - 8.3 gm/dL    Albumin 3.6 3.5 - 5.0 gm/dL    Alkaline Phosphatase 95 40 - 145 U/L    ALT 16 0 - 55 U/L    AST (SGOT) 19 10 - 42 U/L    Bilirubin, Total 0.6 0.1 - 1.2 mg/dL    Albumin/Globulin Ratio 1.06 0.80 - 2.00 Ratio    Anion Gap 14.5 7.0 - 18.0 mMol/L    BUN/Creatinine Ratio 18.6 10.0 - 30.0 Ratio    EGFR 55 (L) 60 - 150 mL/min/1.22m2    Osmolality Calculated 285 275 - 300 mOsm/kg    Globulin 3.4 2.0 - 4.0 gm/dL   Troponin I (STAT)    Collection Time: 03/02/18  1:25 PM   Result Value Ref Range    Troponin I 0.02 0.00 - 0.02 ng/mL   PT/APTT    Collection Time: 03/02/18  1:25 PM   Result Value Ref Range    PT 14.3 (H) 9.5 - 11.5 sec    PT INR 1.4 (H) 0.5 - 1.3    aPTT 29.0 24.0 - 34.0 sec   ECG 12 lead (Stat)    Collection Time: 03/02/18  2:15 PM   Result Value Ref Range    Patient Age 32 years    Patient DOB 1945-08-22     Patient Height      Patient Weight      Interpretation Text       Atrial fibrillation  Ventricular premature complex  LVH with secondary repolarization abnormality  Compared to ECG 07/03/2017 10:06:29  Ventricular premature complex(es) now present    Electronically Signed On 03-02-2018 15:39:37 EST by Bernadette Hoit      Physician Interpreter Bernadette Hoit     Ventricular Rate 127 //min    QRS Duration 108 ms    P-R Interval  ms    Q-T Interval 314 ms    Q-T Interval(Corrected) 457 ms    P Wave Axis  deg    QRS Axis 24 deg    T Axis 109 years        Past Medical History:   Diagnosis Date   . Arthritis    . Atrial fibrillation    . Cardiomyopathy    . Chronic obstructive pulmonary disease    . Hypertension    . 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  APPENDECTOMY     . COLONOSCOPY, POLYPECTOMY  12/16/2012    Procedure: COLONOSCOPY, POLYPECTOMY;  Surgeon: Gwenith Spitz, MD;  Location: [facility];  Service: Gastroenterology;  Laterality: N/A;   . EGD, BIOPSY  12/16/2012    Procedure: EGD, BIOPSY;  Surgeon: Gwenith Spitz, MD;  Location: [facility];  Service: Gastroenterology;  Laterality: N/A;   . KNEE ARTHROSCOPY W/ ACL RECONSTRUCTION     . LIFT, ARM (MEDICAL)        History reviewed. No pertinent family history.   Social History     Tobacco Use   . Smoking status: Current Every Day Smoker     Packs/day: 0.50     Types: Cigarettes   . Smokeless tobacco: Never Used   Substance Use Topics   . Alcohol use: Yes     Comment: occasional      Allergies   Allergen Reactions   . Codeine       Xr Chest Ap Portable    Result Date: 03/02/2018  1.  MILD PULMONARY VASCULAR CONGESTION. 2.  OTHERWISE NO ACUTE CARDIOPULMONARY FINDINGS GIVEN PORTABLE TECHNIQUE. 3.  CARDIOMEGALY. ReadingStation:[id number]     Home Medications     Med List Status:  Pharmacy Completed Set By: Claudie Revering, PharmD at 03/02/2018  4:10 PM                Cholecalciferol (VITAMIN D3) 400 units Cap     Take 1 capsule by mouth every morning.         furosemide (LASIX) 40 MG tablet     Take 1 tablet (40 mg total) by mouth daily.     lisinopril (PRINIVIL,ZESTRIL) 10 MG tablet     Take 1 tablet (10 mg total) by mouth daily.     omeprazole (PRILOSEC) 40 MG capsule     Take 40 mg by mouth every morning.         warfarin (COUMADIN) 5 MG tablet     Take 5 mg by mouth every morning           Ongoing Comment    Claudie Revering, PharmD    03/02/2018  4:10 PM    Pt's warfarin previously managed by [facility] anticoag clinic; however, pt discharge from clinic due to noncompliance. Pt still taking 5mg  daily without monitoring.          Meds given in the ED:  Medications   dilTIAZem (CARDIZEM) injection 20 mg (20 mg Intravenous Given 03/02/18 1525)   dilTIAZem (CARDIZEM) tablet 60 mg (60 mg Oral Given 03/02/18  1535)         Astrid Drafts, MD     03/02/18,5:22 PM   MRN: [id number]                                      CSN: [id number] DOB: 09-Mar-1946

## 2018-03-02 NOTE — ED Notes (Signed)
Bed: C13-A  Expected date:   Expected time:   Means of arrival:   Comments:  EMS

## 2018-03-03 ENCOUNTER — Inpatient Hospital Stay: Payer: Medicare PPO

## 2018-03-03 LAB — COMPREHENSIVE METABOLIC PANEL
ALT: 15 U/L (ref 0–55)
AST (SGOT): 18 U/L (ref 10–42)
Albumin/Globulin Ratio: 1.03 Ratio (ref 0.80–2.00)
Albumin: 3.3 gm/dL — ABNORMAL LOW (ref 3.5–5.0)
Alkaline Phosphatase: 84 U/L (ref 40–145)
Anion Gap: 13 mMol/L (ref 7.0–18.0)
BUN / Creatinine Ratio: 17.1 Ratio (ref 10.0–30.0)
BUN: 21 mg/dL (ref 7–22)
Bilirubin, Total: 0.7 mg/dL (ref 0.1–1.2)
CO2: 25 mMol/L (ref 20–30)
Calcium: 8.9 mg/dL (ref 8.5–10.5)
Chloride: 105 mMol/L (ref 98–110)
Creatinine: 1.23 mg/dL (ref 0.80–1.30)
EGFR: 59 mL/min/{1.73_m2} — ABNORMAL LOW (ref 60–150)
Globulin: 3.2 gm/dL (ref 2.0–4.0)
Glucose: 110 mg/dL — ABNORMAL HIGH (ref 71–99)
Osmolality Calculated: 281 mOsm/kg (ref 275–300)
Potassium: 4 mMol/L (ref 3.5–5.3)
Protein, Total: 6.5 gm/dL (ref 6.0–8.3)
Sodium: 139 mMol/L (ref 136–147)

## 2018-03-03 LAB — CBC AND DIFFERENTIAL
Basophils %: 0.5 % (ref 0.0–3.0)
Basophils Absolute: 0 10*3/uL (ref 0.0–0.3)
Eosinophils %: 7 % (ref 0.0–7.0)
Eosinophils Absolute: 0.6 10*3/uL (ref 0.0–0.8)
Hematocrit: 45.3 % (ref 39.0–52.5)
Hemoglobin: 15.4 gm/dL (ref 13.0–17.5)
Lymphocytes Absolute: 2 10*3/uL (ref 0.6–5.1)
Lymphocytes: 24.4 % (ref 15.0–46.0)
MCH: 33 pg (ref 28–35)
MCHC: 34 gm/dL (ref 32–36)
MCV: 97 fL (ref 80–100)
MPV: 8.3 fL (ref 6.0–10.0)
Monocytes Absolute: 0.8 10*3/uL (ref 0.1–1.7)
Monocytes: 9.5 % (ref 3.0–15.0)
Neutrophils %: 58.5 % (ref 42.0–78.0)
Neutrophils Absolute: 4.9 10*3/uL (ref 1.7–8.6)
PLT CT: 201 10*3/uL (ref 130–440)
RBC: 4.67 10*6/uL (ref 4.00–5.70)
RDW: 13.1 % (ref 11.0–14.0)
WBC: 8.4 10*3/uL (ref 4.0–11.0)

## 2018-03-03 LAB — BASIC METABOLIC PANEL
Anion Gap: 15.1 mMol/L (ref 7.0–18.0)
BUN / Creatinine Ratio: 15.6 Ratio (ref 10.0–30.0)
BUN: 21 mg/dL (ref 7–22)
CO2: 25 mMol/L (ref 20–30)
Calcium: 9.1 mg/dL (ref 8.5–10.5)
Chloride: 104 mMol/L (ref 98–110)
Creatinine: 1.35 mg/dL — ABNORMAL HIGH (ref 0.80–1.30)
EGFR: 52 mL/min/{1.73_m2} — ABNORMAL LOW (ref 60–150)
Glucose: 113 mg/dL — ABNORMAL HIGH (ref 71–99)
Osmolality Calculated: 283 mOsm/kg (ref 275–300)
Potassium: 4.1 mMol/L (ref 3.5–5.3)
Sodium: 140 mMol/L (ref 136–147)

## 2018-03-03 LAB — CBC
Hematocrit: 45.9 % (ref 39.0–52.5)
Hematocrit: 52.6 % — ABNORMAL HIGH (ref 39.0–52.5)
Hemoglobin: 15.4 gm/dL (ref 13.0–17.5)
Hemoglobin: 16 gm/dL (ref 13.0–17.5)
MCH: 33 pg (ref 28–35)
MCH: 30 pg (ref 28–35)
MCHC: 34 gm/dL (ref 32–36)
MCHC: 30 gm/dL — ABNORMAL LOW (ref 32–36)
MCV: 98 fL (ref 80–100)
MCV: 98 fL (ref 80–100)
MPV: 8.3 fL (ref 6.0–10.0)
MPV: 8.1 fL (ref 6.0–10.0)
PLT CT: 208 10*3/uL (ref 130–440)
PLT CT: 255 10*3/uL (ref 130–440)
RBC: 4.71 10*6/uL (ref 4.00–5.70)
RBC: 5.37 10*6/uL (ref 4.00–5.70)
RDW: 13 % (ref 11.0–14.0)
RDW: 13.2 % (ref 11.0–14.0)
WBC: 7.9 10*3/uL (ref 4.0–11.0)
WBC: 7.2 10*3/uL (ref 4.0–11.0)

## 2018-03-03 LAB — CREATININE, SERUM
Creatinine: 1.21 mg/dL (ref 0.80–1.30)
EGFR: 60 mL/min/{1.73_m2} (ref 60–150)

## 2018-03-03 LAB — PT/INR
PT INR: 1.4 — ABNORMAL HIGH (ref 0.5–1.3)
PT: 14 s — ABNORMAL HIGH (ref 9.5–11.5)

## 2018-03-03 MED ORDER — APIXABAN 5 MG PO TABS
5.00 mg | ORAL_TABLET | Freq: Two times a day (BID) | ORAL | Status: DC
Start: 2018-03-03 — End: 2018-03-04
  Administered 2018-03-03 – 2018-03-04 (×2): 5 mg via ORAL
  Filled 2018-03-03 (×3): qty 1

## 2018-03-03 MED ORDER — VH SPIRONOLACTONE 25 MG PO TABS
12.50 mg | ORAL_TABLET | Freq: Every day | ORAL | Status: DC
Start: 2018-03-03 — End: 2018-03-04
  Administered 2018-03-03 – 2018-03-04 (×2): 12.5 mg via ORAL
  Filled 2018-03-03 (×2): qty 1

## 2018-03-03 MED ORDER — CARVEDILOL 6.25 MG PO TABS
12.50 mg | ORAL_TABLET | Freq: Two times a day (BID) | ORAL | Status: DC
Start: 2018-03-03 — End: 2018-03-04
  Administered 2018-03-03 – 2018-03-04 (×2): 12.5 mg via ORAL
  Filled 2018-03-03 (×3): qty 2

## 2018-03-03 MED ORDER — VH WARFARIN THERAPY PLACEHOLDER
1.00 | Status: DC
Start: 2018-03-03 — End: 2018-03-03

## 2018-03-03 NOTE — Progress Notes (Signed)
Cardiology Progress Note      Date Time: 03/03/18 5:35 PM  Patient Name: Christopher Dickerson, Christopher SR.      Assessment:      Atrial fibrillation with rapid ventricular response CHADVASc of 3.  Now the heart rate is controlled.   Patient on Coumadin with subtherapeutic INR   History of left atrial thrombus-December 2018   History of severe cardiomyopathy with systolic function of less than 20%   Hypertension   Smoking   EtOH abuse      Plan:   I saw the patient has a consult in the hospital December 2018.  We started him on the appropriate cardiac medications.  At the time, we schedule him for follow-up and stress test as an outpatient however the patient did not follow-up.  Patient does not recall that admission.  Patient has not been following up with a primary care physician since his original primary care physician retired.  He has been taken a fixed dose of Coumadin with no INR checks as an outpatient.  He continues to drink 4 beers a day.    I had an extensive discussion with the patient  I explained the diagnosis and prognosis of cardiomyopathy as well as A. fib.  Also explained the importance of stroke prevention and the signs and symptoms of bleeding.  I explained the importance of strict salt restriction  I explained the importance of completely quitting drinking    1. Since the patient is not very compliant, I would rather for the patient to be on Eliquis-Eliquis 5 mg twice daily.  Will provide him with a month supply of Eliquis.  I informed the patient to call me if he discovered that he would not be able to afford the co-pay of Eliquis  2. Discontinue Coumadin and Lovenox  3. Discontinue Lasix and start Aldactone 12.5 mg  4. Discontinue Cardizem and start Coreg 12.5 mg twice a day  5. Continue with lisinopril  6. Follow-up on the echocardiogram  7. Will possibly schedule for stress test as an outpatient  8. We will arrange for the patient to be seen within 2 weeks as an outpatient.  The patient  verbalized that he will follow-up      Subjective:   [name]  The patient denies chest pain, worsening DIB (difficulty in breathing), palpitations or dizziness.       Physical Exam:     Temp:  [96.8 F (36 C)-98.7 F (37.1 C)] 97.9 F (36.6 C)  Heart Rate:  [70-89] 77  Resp Rate:  [17-18] 18  BP: (106-170)/(77-110) 131/78    Wt Readings from Last 3 Encounters:   03/03/18 91.9 kg (202 lb 9.6 oz)   07/04/17 90.5 kg (199 lb 8.3 oz)   04/14/17 91.2 kg (201 lb 1.6 oz)            Intake/Output Summary (Last 24 hours) at 03/03/2018 1735  Last data filed at 03/03/2018 0830  Gross per 24 hour   Intake 240 ml   Output -   Net 240 ml       General appearance - alert, well appearing, and in no distress  Chest -clear to auscultation, no wheezes, rales or rhonchi, symmetric air entry  Heart - Irregular rate and rythm  Extremities - peripheral pulses normal, no pedal edema, no clubbing or cyanosis  Abd: soft. Non-tender, non-distended,  Neuro: alert and oriented  Skin: no rashes  Psych: normal affect    Medications:     Scheduled Meds:  apixaban, 5 mg, Oral, Q12H SCH  carvedilol, 12.5 mg, Oral, Q12H SCH  famotidine, 20 mg, Oral, BID  lisinopril, 10 mg, Oral, Daily  sodium chloride (PF), 3 mL, Intravenous, Q8H  spironolactone, 12.5 mg, Oral, Daily              Labs:     Recent Labs   Lab 03/03/18  0913 03/03/18  0435 03/02/18  1325   Glucose  --  110* 99   BUN  --  21 24*   Creatinine 1.21 1.23 1.29   Sodium  --  139 141   Potassium  --  4.0 4.5   Chloride  --  105 106   CO2  --  25 25   AST (SGOT)  --  18 19   ALT  --  15 16     Estimated Creatinine Clearance: 61.5 mL/min (based on SCr of 1.21 mg/dL).    No results for input(s): CHOL, TRIG, HDL, LDL in the last 8760 hours.    Recent Labs   Lab 03/03/18  0913 03/03/18  0435 03/02/18  1325   WBC 7.9 8.4 10.4   Hemoglobin 15.4 15.4 16.4   Hematocrit 45.9 45.3 48.5   PLT CT 208 201 235   PT INR 1.4*  --  1.4*       Recent Labs   Lab 03/02/18  1325 03/02/18  1848 03/02/18  2215   TROPI 0.02  0.02 0.02       Recent Labs   Lab 07/03/17  1025   TSH 6.40*         Electronically signed by:  Harlin Rain, MD   [facility] cardiac and vascular medicine

## 2018-03-03 NOTE — Plan of Care (Signed)
Problem: Moderate/High Fall Risk Score >5  Goal: Patient will remain free of falls  Outcome: Progressing

## 2018-03-03 NOTE — UM Notes (Addendum)
Christopher Dickerson, Christopher SR.   MRN: [id number]   DOB: 09/25/1945     M-505    [employer] ID# [id number]      PRESENTING DATE:  03/02/2018     D/C DATE & DISPOSITION:  No discharge date for patient encounter.     DX:      ICD-10-CM    1. Atrial fibrillation with RVR I48.91         PRESENTING S/S OR COMPLAINTS (INCLUDE PREVIOUS RELEVANT OP TREATMENT/INDICATE IF DISASTER SITUATION): "CC: Dyspnea, atrial fibrillation  Christopher Fenton Sr. is a 72 y.o. male patient with known history of atrial fibrillation, cardiomyopathy with encounter the urgent care with complaints of pain in his knee.  Patient says he has chronic issues in his knee.  At the urgent care he was found to have rapid heart rate and he was sent to the emergency room for atrial fibrillation.  In the ER patient was given Cardizem 20 mg IV and was started on Cardizem p.o.  His heart rate when I saw him was in 80s.  Patient denies any palpitations, chest pain or shortness of breath.  Old records reviewed patient apparently was admitted in March 2019.  He was discharged on Coumadin  .  Apparently.  He did not follow-up at the Coumadin clinic.  He did not have any cardiology follow-ups.  He was also diagnosed to have intracardiac thrombus.  However Christopher Dickerson, he claims that he has been taking his " warfarin along with his other medications daily.   His INR today was 1.4  Patient also continues to smoke cigarettes and drinks alcohol beer 2-3 every day." Per Dr. Nolon Bussing H&P    PMH & PSH (Relevant/Recent):    Past Medical History:   Diagnosis Date   . Arthritis    . Atrial fibrillation    . Cardiomyopathy    . Chronic obstructive pulmonary disease    . Hypertension    . 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 APPENDECTOMY     . COLONOSCOPY, POLYPECTOMY  12/16/2012    Procedure: COLONOSCOPY, POLYPECTOMY;  Surgeon: Gwenith Spitz, MD;  Location: [facility];  Service:  Gastroenterology;  Laterality: N/A;   . EGD, BIOPSY  12/16/2012    Procedure: EGD, BIOPSY;  Surgeon: Gwenith Spitz, MD;  Location: [facility];  Service: Gastroenterology;  Laterality: N/A;   . KNEE ARTHROSCOPY W/ ACL RECONSTRUCTION     . LIFT, ARM (MEDICAL)          VS: T98.8 P133 R20 BP156/103 Sat 98%    EXAM:  1) General Appearance: Alert and oriented x 4. In no acute distress.  Disheveled appearance  2) Eyes: Pink conjunctiva, anicteric sclera. Pupils are equally reactive to light.  3) ENT: Oral mucosa moist with no pharyngeal congestion, erythema or swelling.  4) Neck: Supple, with full range of motion. Trachea is central, no JVD noted  5) Chest: C diminished breath sounds bilaterally, no crackles or wheezing heard  5) CVS: Irregularly irregular heartbeat  6) Abdomen: Soft, non-tender, no palpable mass. Bowel sounds normal. No CVA tenderness  7) Extremities: No pitting edema, pulses palpable, no calf swelling and gross no deformity.  8) Skin: Warm, dry with normal skin turgor, no rash  9) Lymphatics: No lymphadenopathy in axillary, cervical and inguinal area.   10) Neurological: Cranial nerves II-XII intact. No gross focal motor or sensory  deficits noted.  11) Psychiatric: Affect is appropriate. No hallucinations    LAB (Include Dates): 03/02/2018 BUN 24 EGFR 55 PT 14.3 INR 1.4     03/03/2018 Glucose 110 EGFR 59 Albumin 3.3 PT 14.0 INR 1.4     IMAGING (Include Dates):  03/02/2018 XR Chest AP Portable: 1.  MILD PULMONARY VASCULAR CONGESTION.  2.  OTHERWISE NO ACUTE CARDIOPULMONARY FINDINGS GIVEN PORTABLE TECHNIQUE.  3.  CARDIOMEGALY.    ED TREATMENT:    . XR Chest 2 Views   . CBC and differential   . Comprehensive metabolic panel   . Troponin I (STAT)   . PT/APTT   . Cardiac Monitoring (Hard Wire)   . ECG 12 lead (Stat)   ED MEDICATIONS: Cardizem 20mg  IV Cardizem 60mg  PO     TREATMENT/PLAN OF CARE:    Assessment and Plan:                                                              Atrial fibrillation with  RVR  Admit to medical floor  Monitor on telemetry  Rate control with Cardizem  -already started on p.o.  , will give Cardizem IV if his heart rate is again high.  Patient apparently takes warfarin at home but his INR was 1.4  We will start him on Lovenox for bridging along with warfarin    History of cardiomyopathy with ejection fraction of 10%  History of intracardiac thrombus  We will put the patient on IV diuresis  Repeat echocardiogram  Trend troponins to rule out ACS    Hypertension-continue with lisinopril    Chronic tobacco smoking-patient was advised to quit     Chronic alcohol use-drinks 2-3 beers daily , patient will be placed on CIWA protocol       DVT PPx: lovenox   Code: full code   Active Hospital Problem List  Active Problems:    Atrial fibrillation with RVR       [place] PLAN & SOCIAL HISTORY:  NA    ADDITIONAL CLINICAL INFORMATION:     CARDIOLOGY: 03/02/2018  Assessment / Plan     Atrial fibrillation, persistent, CHA2DS2-VASc 3, presence by prior imaging of left atrial appendage clot. RVR.   According to the previous clinical note there was a plan in place for having him undergo electrical cardioversion once he was therapeutic and INR is and had no evidence of intra-atrial appendage clot.   His INR unfortunately is subtherapeutic, I would cover him with IV heparin in the interim and readjust his Coumadin to achieve an INR of 2.   There is no indication at this point in time for electrical cardioversion given how stable he is.   Would continue to optimize his beta-blocker therapy with high-dose beta-blockade to minimize rapid ventricular response, I would not use Cardizem unless for very short periods of time.   I believe he would also benefit from a small dose of digoxin as needed.    Severe dilated cardiomyopathy, etiology unclear, NYHA class II, well compensated, euvolemic.   Again according to the clinic notes once he was optimized on medical therapy I believe there was a plan for an  ischemic work-up.  At present he has no indication for it.   Resume home regimen of ACE/ARB with Aldactone as  required per   He is remarkably euvolemic clinically we will check a BNP.      At present his heart rate seems to be fairly well controlled.  I will place him on Dr. Jenetta Downer rounding list for tomorrow    MEDICATIONS: Cardizem 60mg  PO 4 times daily Lovenox 100mg  1mg /kg q 12 hrs Pepcid 20mg  PO 2 times daily     Coby Antrobus L. [facility] RN BSN  [facility]  Utilization Review  [address]  [place] [address]  [phone]  Fax [phone]

## 2018-03-03 NOTE — Progress Notes (Signed)
1535: Assumed care. Dr Monte Fantasia in to see pt. Orders received.  1843: Pt resting comfortably. No complaints or needs voiced. Telemetry remains intact

## 2018-03-03 NOTE — Progress Notes (Signed)
Medicine Progress Note - [facility], [place]   Patient Name: Christopher Dickerson, Christopher SR. LOS: 1 days   Attending Physician: Astrid Drafts, MD PCP: Gerilyn Pilgrim, MD      Assessment and Plan:                                                              Atrial fibrillation with RVR  Heart rate is under better controlled now  Continue with Cardizem p.o.  Anticoagulation with warfarin and Lovenox for bridging  Monitor INR daily  Discussed and emphasized compliance with medications with the patient and need for INR monitoring as an outpatient  RN to guide the patient regarding the Coumadin clinic  .  Cardiology evaluation    Severe dilated cardiomyopathy -patient appears well compensated  We will continue with ACE inhibitors, diuresis with Lasix      Attention-continue with lisinopril    Chronic tobacco smoking- patient was urged to quit smoking    Chronic alcohol use-   no signs of alcohol withdrawal, drinks 2-3 beers daily                Disposition: Home tomorrow if remains stable  DVT PPX:  Lovenox  Code:  Full Code         EMR Generated Hospital Problem List   Active Problems:    Atrial fibrillation with RVR     Subjective   Patient states he does not have any chest pain or palpitations he is doing just fine.  He was ambulating in the hallways without any distress        Objective   Physical Exam:     Vitals: T:98.1 F (36.7 C) (Oral), BP:141/77, HR:89, RR:18, SaO2:99%    General: Patient is awake. In no acute distress.  HEENT: PERRL, EOMI, no conjunctival drainage, vision is intact.  Neck: supple, no thyromegaly.  Chest: CTA bilaterally. No rhonchi, no wheezing. No use of accessory muscles.  CVS: normal rate and regular rhythm no murmurs, without JVD.  Abdomen: soft, non-tender, no guarding or rigidity, with normal bowel sounds.  Extremities: No pitting edema, pulses palpable, no calf swelling and gross no deformity.  Skin: Warm, dry, no rash and no worrisome  lesions.  NEURO: no motor or sensory deficits.  Psychiatric: alert, interactive, appropriate, normal affect.  Weight Monitoring 04/13/2017 04/14/2017 07/03/2017 07/03/2017 07/04/2017 03/02/2018 03/03/2018   Height - - 182.9 cm - - 182.9 cm -   Height Method - - - - - - -   Weight 92.7 kg 91.218 kg 97.1 kg 93.6 kg 90.5 kg 99.791 kg 91.9 kg   Weight Method - Standing Scale - Standing Scale Standing Scale - Standing Scale   BMI (calculated) - - 29.1 kg/m2 - - 29.9 kg/m2 -         Intake/Output Summary (Last 24 hours) at 03/03/2018 1428  Last data filed at 03/03/2018 0830  Gross per 24 hour   Intake 240 ml   Output -   Net 240 ml     Body mass index is 27.48 kg/m.   Meds:   Current Facility-Administered Medications   Medication Dose Route Frequency   . dilTIAZem  60 mg Oral 4 times per day   . enoxaparin  1 mg/kg Subcutaneous  Q12H   . famotidine  20 mg Oral BID   . furosemide  40 mg Intravenous BID   . lisinopril  10 mg Oral Daily   . sodium chloride (PF)  3 mL Intravenous Q8H   . warfarin  5 mg Oral Daily at 1800   . warfarin therapy placeholder  1 each Does not apply See Admin Instructions       PRN Meds: acetaminophen **OR** acetaminophen **OR** acetaminophen, naloxone.   LABS:   Estimated Creatinine Clearance: 61.5 mL/min (based on SCr of 1.21 mg/dL).  Recent Labs   Lab 03/03/18  0913 03/03/18  0435   WBC 7.9 8.4   RBC 4.71 4.67   Hemoglobin 15.4 15.4   Hematocrit 45.9 45.3   MCV 98 97   PLT CT 208 201     Recent Labs   Lab 03/03/18  0913 03/02/18  1325   PT 14.0* 14.3*   PT INR 1.4* 1.4*   aPTT  --  29.0     Recent Labs   Lab 03/02/18  2215 03/02/18  1848 03/02/18  1325   Troponin I 0.02 0.02 0.02     No results found for: HGBA1CPERCNT  Recent Labs   Lab 03/03/18  0913 03/03/18  0435 03/02/18  1325   Glucose  --  110* 99   Sodium  --  139 141   Potassium  --  4.0 4.5   Chloride  --  105 106   CO2  --  25 25   BUN  --  21 24*   Creatinine 1.21 1.23 1.29   EGFR 60 59* 55*   Calcium  --  8.9 9.1     Recent Labs   Lab  03/03/18  0435 03/02/18  1325   Albumin 3.3* 3.6   Protein, Total 6.5 7.0   Bilirubin, Total 0.7 0.6   Alkaline Phosphatase 84 95   ALT 15 16   AST (SGOT) 18 19           Invalid input(s):  AMORPHOUSUA   Patient Lines/Drains/Airways Status    Active PICC Line / CVC Line / PIV Line / Drain / Airway / Intraosseous Line / Epidural Line / ART Line / Line / Wound / Pressure Ulcer / NG/OG Tube     Name:   Placement date:   Placement time:   Site:   Days:    Peripheral IV 03/02/18 Right Forearm   03/02/18    1335    Forearm   1               Xr Chest Ap Portable    Result Date: 03/02/2018  1.  MILD PULMONARY VASCULAR CONGESTION. 2.  OTHERWISE NO ACUTE CARDIOPULMONARY FINDINGS GIVEN PORTABLE TECHNIQUE. 3.  CARDIOMEGALY. ReadingStation:[id number]     Time spent: 40 minutwees   Nutrition assessment done in collaboration with Registered Dietitians:        Astrid Drafts, MD     03/03/18,2:28 PM   MRN: [id number]                                      CSN: [id number] DOB: Nov 05, 1945

## 2018-03-03 NOTE — Progress Notes (Addendum)
NURSE NOTE SUMMARY  [facility] - SURG TELEMETRY STEP-DOWN   Patient Name: Christopher Dickerson, Christopher SR.   Attending Physician: Astrid Drafts, MD   Today's date:   03/03/2018 LOS: 1 days   Shift Summary:                                                              11:04 AM  Patient resting comfortably in bed. Denies pain or needs. Patient concerned that nutrition isn't coming to the door to get his food orders. Patient educated on how to use phone to order. Will continue to monitor   2:00 PM  Patient transported to have his ECHO completed   Provider Notifications:      Rapid Response Notifications:  Mobility:      PMP Activity: Step 6 - Walks in Room (03/02/2018 10:00 PM)     Weight tracking:  Family Dynamic:   Last 3 Weights for the past 72 hrs (Last 3 readings):   Weight   03/03/18 0258 91.9 kg (202 lb 9.6 oz)   03/02/18 1300 99.8 kg (220 lb)             Recent Vitals Last Bowel Movement   BP: (!) 149/96 (03/03/2018 10:37 AM)  Heart Rate: 70 (03/03/2018  7:44 AM)  Temp: 98.4 F (36.9 C) (03/03/2018  7:44 AM)  Resp Rate: 18 (03/03/2018  7:44 AM)  Height: 1.829 m (6') (03/02/2018  1:00 PM)  Weight: 91.9 kg (202 lb 9.6 oz) (03/03/2018  2:58 AM)  SpO2: 98 % (03/03/2018  7:44 AM)   Last BM Date: 03/02/18

## 2018-03-04 LAB — CBC AND DIFFERENTIAL
Basophils %: 0.8 % (ref 0.0–3.0)
Basophils Absolute: 0.1 10*3/uL (ref 0.0–0.3)
Eosinophils %: 7.1 % — ABNORMAL HIGH (ref 0.0–7.0)
Eosinophils Absolute: 0.6 10*3/uL (ref 0.0–0.8)
Hematocrit: 48 % (ref 39.0–52.5)
Hemoglobin: 16.3 gm/dL (ref 13.0–17.5)
Lymphocytes Absolute: 1.8 10*3/uL (ref 0.6–5.1)
Lymphocytes: 21.4 % (ref 15.0–46.0)
MCH: 33 pg (ref 28–35)
MCHC: 34 gm/dL (ref 32–36)
MCV: 96 fL (ref 80–100)
MPV: 8.2 fL (ref 6.0–10.0)
Monocytes Absolute: 0.7 10*3/uL (ref 0.1–1.7)
Monocytes: 8.6 % (ref 3.0–15.0)
Neutrophils %: 62.1 % (ref 42.0–78.0)
Neutrophils Absolute: 5.3 10*3/uL (ref 1.7–8.6)
PLT CT: 224 10*3/uL (ref 130–440)
RBC: 4.98 10*6/uL (ref 4.00–5.70)
RDW: 13.1 % (ref 11.0–14.0)
WBC: 8.6 10*3/uL (ref 4.0–11.0)

## 2018-03-04 LAB — BASIC METABOLIC PANEL
Anion Gap: 16.4 mMol/L (ref 7.0–18.0)
BUN / Creatinine Ratio: 15.6 Ratio (ref 10.0–30.0)
BUN: 19 mg/dL (ref 7–22)
CO2: 24 mMol/L (ref 20–30)
Calcium: 9 mg/dL (ref 8.5–10.5)
Chloride: 103 mMol/L (ref 98–110)
Creatinine: 1.22 mg/dL (ref 0.80–1.30)
EGFR: 59 mL/min/{1.73_m2} — ABNORMAL LOW (ref 60–150)
Glucose: 109 mg/dL — ABNORMAL HIGH (ref 71–99)
Osmolality Calculated: 282 mOsm/kg (ref 275–300)
Potassium: 3.4 mMol/L — ABNORMAL LOW (ref 3.5–5.3)
Sodium: 140 mMol/L (ref 136–147)

## 2018-03-04 LAB — PT/INR
PT INR: 1.3 (ref 0.5–1.3)
PT: 12.9 s — ABNORMAL HIGH (ref 9.5–11.5)

## 2018-03-04 MED ORDER — CARVEDILOL 25 MG PO TABS
25.00 mg | ORAL_TABLET | Freq: Two times a day (BID) | ORAL | 0 refills | Status: DC
Start: 2018-03-04 — End: 2018-03-04

## 2018-03-04 MED ORDER — CARVEDILOL 25 MG PO TABS
25.00 mg | ORAL_TABLET | Freq: Two times a day (BID) | ORAL | 0 refills | Status: AC
Start: 2018-03-04 — End: 2018-04-03

## 2018-03-04 MED ORDER — APIXABAN 5 MG PO TABS
5.00 mg | ORAL_TABLET | Freq: Two times a day (BID) | ORAL | 1 refills | Status: AC
Start: 2018-03-04 — End: 2018-04-03

## 2018-03-04 MED ORDER — VH POTASSIUM CHLORIDE CRYS ER 20 MEQ PO TBCR (WRAP)
40.00 meq | EXTENDED_RELEASE_TABLET | Freq: Once | ORAL | Status: AC
Start: 2018-03-04 — End: 2018-03-04
  Administered 2018-03-04: 09:00:00 40 meq via ORAL
  Filled 2018-03-04: qty 2

## 2018-03-04 MED ORDER — SPIRONOLACTONE 25 MG PO TABS
12.50 mg | ORAL_TABLET | Freq: Every day | ORAL | 0 refills | Status: AC
Start: 2018-03-05 — End: 2018-04-04

## 2018-03-04 MED ORDER — APIXABAN 5 MG PO TABS
5.00 mg | ORAL_TABLET | Freq: Two times a day (BID) | ORAL | 1 refills | Status: DC
Start: 2018-03-04 — End: 2018-03-04

## 2018-03-04 MED ORDER — SPIRONOLACTONE 25 MG PO TABS
12.50 mg | ORAL_TABLET | Freq: Every day | ORAL | 0 refills | Status: DC
Start: 2018-03-05 — End: 2018-03-04

## 2018-03-04 MED ORDER — METOPROLOL TARTRATE 5 MG/5ML IV SOLN
5.00 mg | Freq: Once | INTRAVENOUS | Status: AC
Start: 2018-03-04 — End: 2018-03-04
  Administered 2018-03-04: 11:00:00 5 mg via INTRAVENOUS
  Filled 2018-03-04 (×2): qty 5

## 2018-03-04 NOTE — Plan of Care (Signed)
NURSE NOTE SUMMARY  [facility] - SURG TELEMETRY STEP-DOWN   Patient Name: Christopher Dickerson, Christopher Dickerson.   Attending Physician: Astrid Drafts, MD   Today's date:   03/04/2018 LOS: 2 days   Shift Summary:                                                              Pt care assumed,pt resting in bed, VSS, Iv flushed-no pain/redness/swelling, pt oriented to call bell, CBIR/bedside table in reach, pt has no further needs or complaints of pain at this time, will continue to monitor pt.    Pt had a burst of HR of 160, physician ordered metoprolol 5mg  IV, pt tolerated well. Will continue to monitor pt.    Provider Notifications:      Rapid Response Notifications:  Mobility:      PMP Activity: Step 7 - Walks out of Room (03/04/2018  2:00 PM)     Weight tracking:  Family Dynamic:   Last 3 Weights for the past 72 hrs (Last 3 readings):   Weight   03/04/18 0234 90.6 kg (199 lb 11.8 oz)   03/03/18 0258 91.9 kg (202 lb 9.6 oz)   03/02/18 1300 99.8 kg (220 lb)             Recent Vitals Last Bowel Movement   BP:   (!) 130/94 (03/04/2018 11:42 AM)  Heart Rate: 80 (03/04/2018 11:42 AM)  Temp: 97.5 F (36.4 C) (03/04/2018 11:42 AM)  Resp Rate: 16 (03/04/2018 11:42 AM)  Weight: 90.6 kg (199 lb 11.8 oz) (03/04/2018  2:34 AM)  SpO2: 96 % (03/04/2018 11:42 AM)   Last BM Date: 03/04/18         Problem: Moderate/High Fall Risk Score >5  Goal: Patient will remain free of falls  Outcome: Progressing  Flowsheets (Taken 03/04/2018 1414)  Moderate Risk (6-13): LOW-Fall Interventions Appropriate for Low Fall Risk

## 2018-03-04 NOTE — Progress Notes (Addendum)
NURSE NOTE SUMMARY  [facility] - SURG TELEMETRY STEP-DOWN   Patient Name: Christopher Dickerson, Christopher SR.   Attending Physician: Astrid Drafts, MD   Today's date:   03/04/2018 LOS: 2 days   Shift Summary:                                                              1610- Report received. Assumed care. Pt awake in bed. No c/o pain or discomfort at this time. No distress noted. CBIR. Will continue to monitor.  0600- Pt stable overnight.   Provider Notifications:      Rapid Response Notifications:  Mobility:      PMP Activity: Step 7 - Walks out of Room (03/03/2018  8:10 PM)     Weight tracking:  Family Dynamic:   Last 3 Weights for the past 72 hrs (Last 3 readings):   Weight   03/04/18 0234 90.6 kg (199 lb 11.8 oz)   03/03/18 0258 91.9 kg (202 lb 9.6 oz)   03/02/18 1300 99.8 kg (220 lb)             Recent Vitals Last Bowel Movement   BP: (!) 152/108 (03/04/2018  2:57 AM)  Heart Rate: 87 (03/04/2018  2:57 AM)  Temp: 98.1 F (36.7 C) (03/04/2018  2:34 AM)  Resp Rate: 18 (03/04/2018  2:34 AM)  Weight: 90.6 kg (199 lb 11.8 oz) (03/04/2018  2:34 AM)  SpO2: 96 % (03/04/2018  2:34 AM)   Last BM Date: 03/03/18

## 2018-03-04 NOTE — Discharge Summary (Signed)
Medicine Discharge Summary - [facility]  [place] [facility], [place]   Patient Name: Christopher Dickerson, Christopher SR.   Attending Physician: No att. providers found PCP: Gerilyn Pilgrim, MD   Date of Admission: 03/02/2018 D/C Date: 03/04/2018   Discharge Diagnoses:   Atrial fibrillation with RVR  Severe cardiomyopathy  Hypertension  Chronic tobacco smoking  Chronic alcohol use         Hospital Course     Christopher Jaggers Sr. is a 72 y.o. male patient.  Was transferred to the ER from the urgent care for rapid atrial fibrillation.  Patient is known to have cardiomyopathy and atrial fibrillation and was on Coumadin at home.   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  Apparently after that patient lost to follow-up and did not go to the Coumadin clinic.  Patient reported he has been taking his Coumadin regularly.  His INR was 1.4 upon presentation.  Patient was started on Cardizem drip for RVR.  His heart rate stabilized.   He was seen by cardiologist.   Due to questionable compliance patient was switched to Eliquis for anticoagulation.  He was also started on Coreg and Spironolactone.  During the hospital stay patient was also treated with IV Lasix for diuresis.  With improvement in his symptoms patient was cleared for discharge.  He was counseled at length to quit smoking and drinking alcohol  Medical compliance was emphasized and patient supposed to have follow-up with cardiologist Dr. Jonah Blue in 2 weeks.          Active Problems:    Atrial fibrillation with RVR  Resolved Problems:    * No resolved hospital problems. *        Discharge Instructions:        Disposition:  home  Diet: Cardiac Diet  Activity: As tolerated  Discharge Code Status: Prior  Gerilyn Pilgrim, MD  [address] [place] [address]  [phone]    Follow up  Office should call you regarding  follow up. If they do not call you, please call them.    Harlin Rain, MD  [address]  [place] [address]  [phone]    Follow up on 03/18/2018  @11 :40am Katie K.M. Annabell Howells, PA-C.     Discharge Medications:                                                                      Discharge Medication List      Taking    apixaban 5 MG  Dose:  5 mg  Commonly known as:  ELIQUIS  For:  Thromboembolism secondary to Atrial Fibrillation  Take 1 tablet (5 mg total) by mouth every 12 (twelve) hours     carvedilol 25 MG tablet  Dose:  25 mg  Commonly known as:  COREG  Take 1 tablet (25 mg total) by mouth every 12 (twelve) hours     lisinopril 10 MG tablet  Dose:  10 mg  Commonly known as:  PRINIVIL,ZESTRIL  For:  Heart Failure  Take 1 tablet (10 mg total) by mouth  daily.     omeprazole 40 MG capsule  Dose:  40 mg  Commonly known as:  PriLOSEC  Take 40 mg by mouth every morning.     spironolactone 25 MG tablet  Dose:  12.5 mg  Commonly known as:  ALDACTONE  Start taking on:  March 05, 2018  Take 0.5 tablets (12.5 mg total) by mouth daily     Vitamin D3 10 MCG (400 UNIT) Caps  Dose:  1 capsule  Take 1 capsule by mouth every morning.        STOP taking these medications    furosemide 40 MG tablet  Commonly known as:  LASIX     warfarin 5 MG tablet  Commonly known as:  COUMADIN           Discharge Day Exam (03/04/2018):   Blood pressure (!) 130/94, pulse 80, temperature 97.5 F (36.4 C), temperature source Oral, resp. rate 16, height 1.829 m (6'), weight 90.6 kg (199 lb 11.8 oz), SpO2 96 %.    General: Patient is awake. In no acute distress.  HEENT: PERRL, EOMI, no conjunctival drainage, vision is intact.  Neck: supple, no thyromegaly.  Chest: CTA bilaterally. No rhonchi, no wheezing. No use of accessory muscles.  CVS: normal rate and regular rhythm no murmurs, without JVD.  Abdomen: soft, non-tender, no guarding or rigidity, with normal bowel sounds.  Extremities: No pitting edema, pulses palpable, no calf swelling  and gross no deformity.  Skin: Warm, dry, no rash and no worrisome lesions.  NEURO: no motor or sensory deficits.  Psychiatric: alert, interactive, appropriate, normal affect.   Recent Labs    Recent Labs   Lab 03/04/18  0238 03/03/18  1641 03/03/18  0913 03/03/18  0435 03/02/18  1325   WBC 8.6 7.2 7.9 8.4 10.4   RBC 4.98 5.37 4.71 4.67 4.98   Hemoglobin 16.3 16.0 15.4 15.4 16.4   Hematocrit 48.0 52.6* 45.9 45.3 48.5   MCV 96 98 98 97 97   PLT CT 224 255 208 201 235     Recent Labs   Lab 03/04/18  0238 03/03/18  0913 03/02/18  1325   PT 12.9* 14.0* 14.3*   PT INR 1.3 1.4* 1.4*   aPTT  --   --  29.0     Recent Labs   Lab 03/02/18  2215 03/02/18  1848 03/02/18  1325   Troponin I 0.02 0.02 0.02     No results found for: HGBA1CPERCNT  Recent Labs   Lab 03/04/18  0238 03/03/18  1641 03/03/18  0913 03/03/18  0435 03/02/18  1325   Glucose 109* 113*  --  110* 99   Sodium 140 140  --  139 141   Potassium 3.4* 4.1  --  4.0 4.5   Chloride 103 104  --  105 106   CO2 24 25  --  25 25   BUN 19 21  --  21 24*   Creatinine 1.22 1.35* 1.21 1.23 1.29   EGFR 59* 52* 60 59* 55*   Calcium 9.0 9.1  --  8.9 9.1     Recent Labs   Lab 03/03/18  0435 03/02/18  1325   Albumin 3.3* 3.6   Protein, Total 6.5 7.0   Bilirubin, Total 0.7 0.6   Alkaline Phosphatase 84 95   ALT 15 16   AST (SGOT) 18 19      Allergies:      Codeine   Time spent on discharging  the patient:  45 minutes   Xr Chest Ap Portable    Result Date: 03/02/2018  1.  MILD PULMONARY VASCULAR CONGESTION. 2.  OTHERWISE NO ACUTE CARDIOPULMONARY FINDINGS GIVEN PORTABLE TECHNIQUE. 3.  CARDIOMEGALY. ReadingStation:[id number]     Astrid Drafts, MD         03/04/18 5:54 PM   MRN: [id number]                                      CSN: [id number] DOB: 1945-05-30

## 2018-03-12 ENCOUNTER — Telehealth: Payer: Self-pay

## 2018-03-12 NOTE — Telephone Encounter (Signed)
PCP was contacted/Faxed for recent office note and labs, Cardiac Testing with Tracing

## 2018-03-12 NOTE — Telephone Encounter (Signed)
Lab reports located in Epic.

## 2018-03-12 NOTE — Telephone Encounter (Signed)
Notes scanned into the chart

## 2018-03-16 ENCOUNTER — Encounter: Payer: Self-pay | Admitting: Cardiovascular Disease

## 2018-03-16 ENCOUNTER — Inpatient Hospital Stay: Payer: Self-pay | Admitting: Cardiovascular Disease

## 2018-03-18 ENCOUNTER — Inpatient Hospital Stay: Payer: Self-pay | Admitting: Physician Assistant

## 2018-03-19 ENCOUNTER — Telehealth: Payer: Self-pay

## 2018-03-19 NOTE — Telephone Encounter (Signed)
LM relaying prep instructions for stress test on 11/22. Needs to hold Coreg for 24 hours, ONLY if able to walk on TM.     Lacinda Axon CMA

## 2018-03-20 NOTE — Progress Notes (Deleted)
[facility]                                                                                                                Patient Name: Christopher Friesen Sr.   Date of Birth: 04-01-1946  Age: 72 y.o.  Gender: male    Patient Care Team:  Gerilyn Pilgrim, MD as PCP - General (Family Medicine)  Glynis Smiles, FNP as Nurse Practitioner (Family Nurse Practitioner)    Chief Complaint: No chief complaint on file.      Diagnosis      1. DCM (dilated cardiomyopathy)     2. Atrial fibrillation, unspecified type     3. Essential hypertension         Plan:     1. Systolic Dysfunction  - Previous ECHO (***) showed an EF of 20%  - Systolic dysfunction -   NYHA ***  BB - *** Coreg 12.5mg  BID  ACEI - *** Lisinopril  Continue with salt restriction, fluid restriction and the DASH diet. Monitor the weight regularly  Device Therapy: ***  - Aldactone 12.5mg        2. Atrial fibrillation, unspecified type  ***  - Anti- coagulated: 5mg  of Eliquis  - Rate Control: Coreg 12.5mg   - We discussed the diagnosis and the prognosis of atrial fibrillation - CHADVASc 3.  We discussed the signs and symptoms of bleeding and a stroke and I asked the patient to seek medical attention if either of them are suspected.      3. Essential hypertension  ***  - Continue with Lisinopril     Pt is very non- compliant and does not follow up with a primary care doctor.     We discussed an exercise and diet plan - written DASH diet instructions were provided    Follow up at the office in ***     Respectfully,     Electronically signed by:  [facility]   Phone: [phone]  Fax: [phone] - [phone]       History of Present Illness   Christopher Sahakian Sr. is a 72 y.o. male with a past medical history significant for Systolic Dysfunction, Atrial Fibrillation, HTN, alcohol and tobacco use who comes to the office for a hospital follow up visit. He was seen in the  hospital by Dr. Monte Fantasia.     Hospital Course Summary Per Medical Records:    - Pt presented on 03/02/18 to Urgent care for knee pain and was found to have a rapid heart rate   - He was given Cardizem 20mg  IV and PO, pt was asymptomatic at this time.   - Pt was in the hospital in March 2019- he was discharged on coumadin for an intracardiac thrombus, but never followed up with the coumadin clinic or with cardiology. His INR on presentation was 1.4.   - ECHO in the hospital showed an EF of 10-15%.   -  On discharge coumadin was stopped and Eliquis 5mg  was started. Pt was also discharged on Aldactone, Coreg and Lisinopril, with a planned follow up with cardiology, a repeat echo and stress test    Events and changes since : .    *** ECHO, stress test   ***Alcohol   ***Falls, bleeds    The patient denies chest pain, worsening DIB ( difficulty in breathing), palpitations or syncope. No orthopnea or paroxysmal dyspnea (PND). No new symptoms suggestive of TIA or stroke. No symptoms suggestive of claudication.     Cardiac medications: The patient is compliant   Salt restriction: The patient is compliant   The patient's functional capacity: Able to perform daily activities with no CV restriction   Weight changes since last visit: ***    No Hx of premature CAD or sudden cardiac death in the first degree relatives.    The patient does not smoke, drink excessively or use drugs. The patient does not exercise on regular basis but able to walk ***.     Review of Systems     CONSTITUTIONAL: There is no history of fever, weight loss or cough.  DERMATOLOGIC: The patient denies any lesions or rashes.  ENT: No history of congestion, postnasal drip, sore throat or hearing changes.  RESPIRATORY: See HPI   CARDIOVASCULAR: See HPI   GASTROINTESTINAL: No history of nausea, vomiting, diarrhea or abdominal pain.  GENITOURINARY: No history of dysuria or polyuria   MUSCULOSKELETAL: No significant muscle weakness   ENDOCRINE: ***She denies  any heat or cold intolerance or excessive thirst or urination.  NEUROLOGICAL: Currently, no headache. ***She has no vision changes, dizziness or fainting. No numbness or tingling.  PSYCHIATRIC: No apparent significant depression     Past Medical History     Hospital 03/02/18-03/04/18: Afib w/ RVR. Cardiomyopathy.    Past Medical Hx: 03/23/2018 KW/GM    1. Atrial Fibrillation.   a. Patient was on Coumadin but changed to Eliquis due to history of noncompliance.   b. Echo, 03/2017: Global left ventricular systolic function is severely decreased. Estimated EF 10-15%. Global hypokinesis with base contracting little better. The right ventricular systolic function is mildly decreased. The left atrium is moderately enlarged. There is spontaneous echo contrast present in the left atrial cavity. Mobile thrombus present in the left atrial appendage. Spontaneous echo contrast seen in the left atrial appendage.   c. Echo, 03/03/2018: The left ventricle is moderate to severely dilated at 6.3 cm. Global hypokinesis with left ventricular ejection fraction severely decreased, estimated at 10-15%. The right ventricular size is mildy increased. The right ventricular systolic function is mildly decreased. The left atrium is moderately enlarged. The right atrium is mildly enlarged. There is mild aortic dilatation at the level of the sinuses of Valsalva. Normal respiratory variation of the inferior vena cava is present. No evidence of an atrial septal defect by color doppler. Early closure of the mitral valve suggesting elevated left ventricular end-diastolic pressure.     2. Severe Dilated Cardiomyopathy.  a. EF 10-15% on Echo November 2019.     3. COPD.     4. Hypertension.     5. Left Atrial thrombus, secondary to afib. 03/2017.    Past Surgical History     Past Surgical History:   Procedure Laterality Date   . APPENDECTOMY     . COLONOSCOPY, POLYPECTOMY  12/16/2012    Procedure: COLONOSCOPY, POLYPECTOMY;  Surgeon: Gwenith Spitz,  MD;  Location: [facility];  Service: Gastroenterology;  Laterality: N/A;   .  EGD, BIOPSY  12/16/2012    Procedure: EGD, BIOPSY;  Surgeon: Gwenith Spitz, MD;  Location: [facility];  Service: Gastroenterology;  Laterality: N/A;   . KNEE ARTHROSCOPY W/ ACL RECONSTRUCTION     . LIFT, ARM (MEDICAL)         Family History     No family history on file.    Social History     Social History     Tobacco Use   . Smoking status: Current Every Day Smoker     Packs/day: 0.50     Types: Cigarettes   . Smokeless tobacco: Never Used   Substance Use Topics   . Alcohol use: Yes     Comment: occasional   . Drug use: No       Allergies     Allergies   Allergen Reactions   . Codeine        Medications     Current Outpatient Medications   Medication Sig   . apixaban (ELIQUIS) 5 MG Take 1 tablet (5 mg total) by mouth every 12 (twelve) hours   . carvedilol (COREG) 25 MG tablet Take 1 tablet (25 mg total) by mouth every 12 (twelve) hours   . Cholecalciferol (VITAMIN D3) 400 units Cap Take 1 capsule by mouth every morning.       [name] lisinopril (PRINIVIL,ZESTRIL) 10 MG tablet Take 1 tablet (10 mg total) by mouth daily.   [name] omeprazole (PRILOSEC) 40 MG capsule Take 40 mg by mouth every morning.       [name] spironolactone (ALDACTONE) 25 MG tablet Take 0.5 tablets (12.5 mg total) by mouth daily         Physical Exam     There were no vitals filed for this visit.  There is no height or weight on file to calculate BMI.  Wt Readings from Last 3 Encounters:   03/04/18 90.6 kg (199 lb 11.8 oz)   07/04/17 90.5 kg (199 lb 8.3 oz)   04/14/17 91.2 kg (201 lb 1.6 oz)        General appearance - alert, well appearing, and in no distress  Head: normocephalic, atraumatic  Mental status -  Alert and oriented,   Eyes - extraocular eye movements intact  Neck -  Supple, no thyromegaly  Chest - Clear to auscultation bilaterally      Cardiovascular system -    Regular rate and rhythm   +ve S1-S2   No gallop murmur or rubs   No jugular venous distension   +2  pulses in the DP/PD     Neurological - alert, oriented x3, no obvious gross neurological deficits  Extremities -  no clubbing or cyanosis.  No edema bilateral  Psych- appropriate affect    Labs     Lab Results   Component Value Date/Time    WBC 8.6 03/04/2018 02:38 AM    RBC 4.98 03/04/2018 02:38 AM    HGB 16.3 03/04/2018 02:38 AM    HCT 48.0 03/04/2018 02:38 AM    PLT 224 03/04/2018 02:38 AM    TSH 6.40 (H) 07/03/2017 10:25 AM        Lab Results   Component Value Date/Time    NA 140 03/04/2018 02:38 AM    K 3.4 (L) 03/04/2018 02:38 AM    CL 103 03/04/2018 02:38 AM    CO2 24 03/04/2018 02:38 AM    GLU 109 (H) 03/04/2018 02:38 AM    BUN 19 03/04/2018 02:38 AM  PROT 6.5 03/03/2018 04:35 AM    ALKPHOS 84 03/03/2018 04:35 AM    AST 18 03/03/2018 04:35 AM    ALT 15 03/03/2018 04:35 AM       No results found for: CHOL, TRIG, HDL, LDL    No results found for: HGBA1CPERCNT    Cardiac testing and imaging:      EKG: ***NSR, no significant ST/T wave changes, No AV heart blocks, no evidence of myocardial infarction or injury    ECHO (03/03/18):   Left ventricle is moderately- severely dilated at 6.3cm. Global hypokinesis, EF: 10-15%. RV size and function is moderately decreased. Left atrium is moderately enlarged. Mild TR.

## 2018-03-23 ENCOUNTER — Inpatient Hospital Stay: Payer: Self-pay | Admitting: Physician Assistant

## 2018-05-18 ENCOUNTER — Emergency Department: Payer: Medicare PPO

## 2018-05-18 ENCOUNTER — Inpatient Hospital Stay
Admission: EM | Admit: 2018-05-18 | Discharge: 2018-05-27 | DRG: 286 | Disposition: A | Discharge status: Home / Self Care | Payer: Medicare PPO | Attending: Internal Medicine | Admitting: Internal Medicine

## 2018-05-18 DIAGNOSIS — Z91148 Patient's other noncompliance with medication regimen for other reason: Secondary | ICD-10-CM | POA: Insufficient documentation

## 2018-05-18 DIAGNOSIS — I2510 Atherosclerotic heart disease of native coronary artery without angina pectoris: Secondary | ICD-10-CM | POA: Diagnosis present

## 2018-05-18 DIAGNOSIS — I130 Hypertensive heart and chronic kidney disease with heart failure and stage 1 through stage 4 chronic kidney disease, or unspecified chronic kidney disease: Secondary | ICD-10-CM | POA: Diagnosis present

## 2018-05-18 DIAGNOSIS — I509 Heart failure, unspecified: Secondary | ICD-10-CM

## 2018-05-18 DIAGNOSIS — I110 Hypertensive heart disease with heart failure: Secondary | ICD-10-CM

## 2018-05-18 DIAGNOSIS — F0390 Unspecified dementia without behavioral disturbance: Secondary | ICD-10-CM | POA: Diagnosis present

## 2018-05-18 DIAGNOSIS — F17213 Nicotine dependence, cigarettes, with withdrawal: Secondary | ICD-10-CM | POA: Diagnosis present

## 2018-05-18 DIAGNOSIS — I272 Pulmonary hypertension, unspecified: Secondary | ICD-10-CM | POA: Diagnosis present

## 2018-05-18 DIAGNOSIS — N183 Chronic kidney disease, stage 3 (moderate): Secondary | ICD-10-CM | POA: Diagnosis present

## 2018-05-18 DIAGNOSIS — Z9119 Patient's noncompliance with other medical treatment and regimen: Secondary | ICD-10-CM

## 2018-05-18 DIAGNOSIS — Z9114 Patient's other noncompliance with medication regimen: Secondary | ICD-10-CM

## 2018-05-18 DIAGNOSIS — Z7901 Long term (current) use of anticoagulants: Secondary | ICD-10-CM

## 2018-05-18 DIAGNOSIS — I4821 Permanent atrial fibrillation: Principal | ICD-10-CM | POA: Diagnosis present

## 2018-05-18 DIAGNOSIS — J449 Chronic obstructive pulmonary disease, unspecified: Secondary | ICD-10-CM | POA: Diagnosis present

## 2018-05-18 DIAGNOSIS — I10 Essential (primary) hypertension: Secondary | ICD-10-CM | POA: Insufficient documentation

## 2018-05-18 DIAGNOSIS — Z885 Allergy status to narcotic agent status: Secondary | ICD-10-CM

## 2018-05-18 DIAGNOSIS — Z66 Do not resuscitate: Secondary | ICD-10-CM | POA: Diagnosis not present

## 2018-05-18 DIAGNOSIS — Z9049 Acquired absence of other specified parts of digestive tract: Secondary | ICD-10-CM

## 2018-05-18 DIAGNOSIS — I4891 Unspecified atrial fibrillation: Secondary | ICD-10-CM

## 2018-05-18 DIAGNOSIS — Z79899 Other long term (current) drug therapy: Secondary | ICD-10-CM

## 2018-05-18 DIAGNOSIS — I420 Dilated cardiomyopathy: Secondary | ICD-10-CM | POA: Diagnosis present

## 2018-05-18 DIAGNOSIS — R0602 Shortness of breath: Secondary | ICD-10-CM | POA: Insufficient documentation

## 2018-05-18 DIAGNOSIS — I472 Ventricular tachycardia: Secondary | ICD-10-CM | POA: Diagnosis not present

## 2018-05-18 DIAGNOSIS — Z86010 Personal history of colonic polyps: Secondary | ICD-10-CM

## 2018-05-18 DIAGNOSIS — I5023 Acute on chronic systolic (congestive) heart failure: Secondary | ICD-10-CM | POA: Diagnosis present

## 2018-05-18 DIAGNOSIS — I480 Paroxysmal atrial fibrillation: Secondary | ICD-10-CM

## 2018-05-18 DIAGNOSIS — F329 Major depressive disorder, single episode, unspecified: Secondary | ICD-10-CM | POA: Diagnosis present

## 2018-05-18 LAB — COMPREHENSIVE METABOLIC PANEL
ALT: 62 U/L — ABNORMAL HIGH (ref 0–55)
AST (SGOT): 30 U/L (ref 10–42)
Albumin/Globulin Ratio: 1.27 Ratio (ref 0.80–2.00)
Albumin: 3.3 gm/dL — ABNORMAL LOW (ref 3.5–5.0)
Alkaline Phosphatase: 98 U/L (ref 40–145)
Anion Gap: 13.9 mMol/L (ref 7.0–18.0)
BUN / Creatinine Ratio: 12.7 Ratio (ref 10.0–30.0)
BUN: 15 mg/dL (ref 7–22)
Bilirubin, Total: 1 mg/dL (ref 0.1–1.2)
CO2: 25 mMol/L (ref 20–30)
Calcium: 8.8 mg/dL (ref 8.5–10.5)
Chloride: 106 mMol/L (ref 98–110)
Creatinine: 1.18 mg/dL (ref 0.80–1.30)
EGFR: 61 mL/min/{1.73_m2} (ref 60–150)
Globulin: 2.6 gm/dL (ref 2.0–4.0)
Glucose: 115 mg/dL — ABNORMAL HIGH (ref 71–99)
Osmolality Calculated: 281 mOsm/kg (ref 275–300)
Potassium: 4.9 mMol/L (ref 3.5–5.3)
Protein, Total: 5.9 gm/dL — ABNORMAL LOW (ref 6.0–8.3)
Sodium: 140 mMol/L (ref 136–147)

## 2018-05-18 LAB — CBC AND DIFFERENTIAL
Basophils %: 0.7 % (ref 0.0–3.0)
Basophils Absolute: 0.1 10*3/uL (ref 0.0–0.3)
Eosinophils %: 2.7 % (ref 0.0–7.0)
Eosinophils Absolute: 0.3 10*3/uL (ref 0.0–0.8)
Hematocrit: 44 % (ref 39.0–52.5)
Hemoglobin: 14.9 gm/dL (ref 13.0–17.5)
Lymphocytes Absolute: 1.3 10*3/uL (ref 0.6–5.1)
Lymphocytes: 14.1 % — ABNORMAL LOW (ref 15.0–46.0)
MCH: 32 pg (ref 28–35)
MCHC: 34 gm/dL (ref 32–36)
MCV: 95 fL (ref 80–100)
MPV: 8 fL (ref 6.0–10.0)
Monocytes Absolute: 0.8 10*3/uL (ref 0.1–1.7)
Monocytes: 8.8 % (ref 3.0–15.0)
Neutrophils %: 73.7 % (ref 42.0–78.0)
Neutrophils Absolute: 6.9 10*3/uL (ref 1.7–8.6)
PLT CT: 246 10*3/uL (ref 130–440)
RBC: 4.62 10*6/uL (ref 4.00–5.70)
RDW: 13.4 % (ref 11.0–14.0)
WBC: 9.4 10*3/uL (ref 4.0–11.0)

## 2018-05-18 LAB — PT AND APTT
PT INR: 1.1 (ref 0.5–1.3)
PT: 11.1 s (ref 9.5–11.5)
aPTT: 29.4 s (ref 24.0–34.0)

## 2018-05-18 LAB — ECG 12-LEAD
Patient Age: 72 years
Q-T Interval(Corrected): 462 ms
Q-T Interval: 306 ms
QRS Axis: 59 deg
QRS Duration: 103 ms
T Axis: 136 years
Ventricular Rate: 137 //min

## 2018-05-18 LAB — TROPONIN I
Troponin I: 0.04 ng/mL — ABNORMAL HIGH (ref 0.00–0.02)
Troponin I: 0.05 ng/mL — ABNORMAL HIGH (ref 0.00–0.02)
Troponin I: 0.06 ng/mL — ABNORMAL HIGH (ref 0.00–0.02)

## 2018-05-18 LAB — THYROID STIMULATING HORMONE (TSH), REFLEX ON ABNORMAL TO FREE T4, SERUM: TSH: 2.75 u[IU]/mL (ref 0.40–4.20)

## 2018-05-18 LAB — B-TYPE NATRIURETIC PEPTIDE: B-Natriuretic Peptide: 1653.2 pg/mL — ABNORMAL HIGH (ref 0.0–100.0)

## 2018-05-18 MED ORDER — FUROSEMIDE 10 MG/ML IJ SOLN
40.00 mg | Freq: Two times a day (BID) | INTRAMUSCULAR | Status: DC
Start: 2018-05-19 — End: 2018-05-20
  Administered 2018-05-19 (×2): 40 mg via INTRAVENOUS
  Filled 2018-05-18 (×4): qty 4

## 2018-05-18 MED ORDER — ONDANSETRON HCL 4 MG/2ML IJ SOLN
4.00 mg | Freq: Three times a day (TID) | INTRAMUSCULAR | Status: DC | PRN
Start: 2018-05-18 — End: 2018-05-27
  Administered 2018-05-20: 12:00:00 4 mg via INTRAVENOUS
  Filled 2018-05-18: qty 2

## 2018-05-18 MED ORDER — DILTIAZEM HCL 5 MG/ML IV SOLN (WRAP)
10.00 mg | Freq: Once | INTRAVENOUS | Status: AC
Start: 2018-05-18 — End: 2018-05-18
  Administered 2018-05-18: 14:00:00 10 mg via INTRAVENOUS

## 2018-05-18 MED ORDER — LISINOPRIL 20 MG PO TABS
20.0000 mg | ORAL_TABLET | Freq: Every day | ORAL | Status: DC
Start: 2018-05-19 — End: 2018-05-18

## 2018-05-18 MED ORDER — DILTIAZEM HCL 5 MG/ML IV SOLN (WRAP)
10.00 mg | Freq: Once | INTRAVENOUS | Status: AC
Start: 2018-05-18 — End: 2018-05-18
  Administered 2018-05-18: 13:00:00 10 mg via INTRAVENOUS

## 2018-05-18 MED ORDER — NICOTINE 21 MG/24HR TD PT24
1.00 | MEDICATED_PATCH | Freq: Every day | TRANSDERMAL | Status: DC
Start: 2018-05-18 — End: 2018-05-27
  Filled 2018-05-18 (×10): qty 1

## 2018-05-18 MED ORDER — APIXABAN 5 MG PO TABS
5.0000 mg | ORAL_TABLET | Freq: Two times a day (BID) | ORAL | Status: DC
Start: 2018-05-18 — End: 2018-05-19
  Administered 2018-05-18 – 2018-05-19 (×2): 5 mg via ORAL
  Filled 2018-05-18 (×3): qty 1

## 2018-05-18 MED ORDER — LISINOPRIL 10 MG PO TABS
10.0000 mg | ORAL_TABLET | Freq: Two times a day (BID) | ORAL | Status: DC
Start: 2018-05-18 — End: 2018-05-20
  Administered 2018-05-18 – 2018-05-19 (×3): 10 mg via ORAL
  Filled 2018-05-18 (×5): qty 1

## 2018-05-18 MED ORDER — FUROSEMIDE 10 MG/ML IJ SOLN
INTRAMUSCULAR | Status: AC
Start: 2018-05-18 — End: ?
  Filled 2018-05-18: qty 4

## 2018-05-18 MED ORDER — FUROSEMIDE 40 MG PO TABS
40.0000 mg | ORAL_TABLET | Freq: Every day | ORAL | Status: DC
Start: 2018-05-18 — End: 2018-05-18
  Filled 2018-05-18 (×2): qty 1

## 2018-05-18 MED ORDER — SODIUM CHLORIDE (PF) 0.9 % IJ SOLN
3.00 mL | Freq: Three times a day (TID) | INTRAMUSCULAR | Status: DC
Start: 2018-05-18 — End: 2018-05-27
  Administered 2018-05-18 – 2018-05-27 (×19): 3 mL via INTRAVENOUS

## 2018-05-18 MED ORDER — ACETAMINOPHEN 325 MG PO TABS
650.0000 mg | ORAL_TABLET | ORAL | Status: DC | PRN
Start: 2018-05-18 — End: 2018-05-27
  Administered 2018-05-22 (×2): 650 mg via ORAL
  Filled 2018-05-18 (×2): qty 2

## 2018-05-18 MED ORDER — CARVEDILOL 25 MG PO TABS
25.0000 mg | ORAL_TABLET | Freq: Two times a day (BID) | ORAL | Status: DC
Start: 2018-05-18 — End: 2018-05-18
  Filled 2018-05-18: qty 1

## 2018-05-18 MED ORDER — LABETALOL HCL 200 MG PO TABS
100.0000 mg | ORAL_TABLET | Freq: Every day | ORAL | Status: DC
Start: 2018-05-18 — End: 2018-05-18
  Filled 2018-05-18: qty 1

## 2018-05-18 MED ORDER — METOPROLOL SUCCINATE ER 50 MG PO TB24
50.00 mg | ORAL_TABLET | Freq: Every day | ORAL | Status: DC
Start: 2018-05-18 — End: 2018-05-18
  Filled 2018-05-18: qty 1

## 2018-05-18 MED ORDER — METOPROLOL TARTRATE 50 MG PO TABS
50.0000 mg | ORAL_TABLET | Freq: Four times a day (QID) | ORAL | Status: DC
Start: 2018-05-18 — End: 2018-05-20
  Administered 2018-05-18 – 2018-05-20 (×8): 50 mg via ORAL
  Filled 2018-05-18 (×11): qty 1

## 2018-05-18 MED ORDER — FUROSEMIDE 10 MG/ML IJ SOLN
40.00 mg | Freq: Once | INTRAMUSCULAR | Status: AC
Start: 2018-05-18 — End: 2018-05-18
  Administered 2018-05-18: 14:00:00 40 mg via INTRAVENOUS

## 2018-05-18 MED ORDER — NALOXONE HCL 0.4 MG/ML IJ SOLN (WRAP)
0.40 mg | INTRAMUSCULAR | Status: DC | PRN
Start: 2018-05-18 — End: 2018-05-27

## 2018-05-18 MED ORDER — ALBUTEROL SULFATE HFA 108 (90 BASE) MCG/ACT IN AERS
2.00 | INHALATION_SPRAY | RESPIRATORY_TRACT | Status: DC | PRN
Start: 2018-05-18 — End: 2018-05-27
  Administered 2018-05-18: 19:00:00 2 via RESPIRATORY_TRACT
  Filled 2018-05-18: qty 1

## 2018-05-18 MED ORDER — DILTIAZEM HCL 30 MG PO TABS
30.00 mg | ORAL_TABLET | Freq: Four times a day (QID) | ORAL | Status: DC
Start: 2018-05-18 — End: 2018-05-18
  Administered 2018-05-18: 13:00:00 30 mg via ORAL
  Filled 2018-05-18 (×3): qty 1

## 2018-05-18 MED ORDER — FUROSEMIDE 10 MG/ML IJ SOLN
40.00 mg | Freq: Two times a day (BID) | INTRAMUSCULAR | Status: DC
Start: 2018-05-18 — End: 2018-05-18

## 2018-05-18 MED ORDER — DILTIAZEM HCL 30 MG PO TABS
ORAL_TABLET | ORAL | Status: AC
Start: 2018-05-18 — End: ?
  Filled 2018-05-18: qty 1

## 2018-05-18 MED ORDER — FAMOTIDINE 20 MG PO TABS
20.0000 mg | ORAL_TABLET | Freq: Two times a day (BID) | ORAL | Status: DC
Start: 2018-05-18 — End: 2018-05-22
  Administered 2018-05-18 – 2018-05-22 (×9): 20 mg via ORAL
  Filled 2018-05-18 (×10): qty 1

## 2018-05-18 MED ORDER — ONDANSETRON 4 MG PO TBDP
4.00 mg | ORAL_TABLET | Freq: Three times a day (TID) | ORAL | Status: DC | PRN
Start: 2018-05-18 — End: 2018-05-27

## 2018-05-18 MED ORDER — DILTIAZEM HCL 5 MG/ML IV SOLN (WRAP)
INTRAVENOUS | Status: AC
Start: 2018-05-18 — End: ?
  Filled 2018-05-18: qty 5

## 2018-05-18 MED ORDER — ACETAMINOPHEN 650 MG RE SUPP
650.00 mg | RECTAL | Status: DC | PRN
Start: 2018-05-18 — End: 2018-05-27

## 2018-05-18 MED ORDER — CARVEDILOL 25 MG PO TABS
25.0000 mg | ORAL_TABLET | Freq: Two times a day (BID) | ORAL | Status: DC
Start: 2018-05-18 — End: 2018-05-18

## 2018-05-18 MED ORDER — ACETAMINOPHEN 160 MG/5ML PO SOLN
650.00 mg | ORAL | Status: DC | PRN
Start: 2018-05-18 — End: 2018-05-27

## 2018-05-18 NOTE — ED Notes (Signed)
XR at bedside

## 2018-05-18 NOTE — ED Notes (Signed)
Bed: W43-A  Expected date:   Expected time:   Means of arrival:   Comments:  Ems  Eta 1225  No phone to call report

## 2018-05-18 NOTE — Plan of Care (Signed)
Problem: Moderate/High Fall Risk Score >5  Goal: Patient will remain free of falls  Outcome: Progressing  Flowsheets (Taken 05/18/2018 2007)  Moderate Risk (6-13): LOW-Fall Interventions Appropriate for Low Fall Risk

## 2018-05-18 NOTE — Consults (Signed)
Cardiology Consultation Note       Date Time: 05/18/18 3:03 PM  Patient Name: Christopher Dickerson, Christopher Dickerson.  MRN#: [id number]  DOB: Feb 12, 1946  PCP: Gerilyn Pilgrim, MD  Attending: Georgia Duff, DO    Primary Cardiologist: Dr. Monte Fantasia     Reason for Consultation:   Atrial fibrillation / Severe cardiomyopathy and acute on chronic heart failure      Assessment / Plan   1.  Atrial fibrillation, rapid rate Hr 137  -restart his bblocker will use metoprolol with titration parameters.  -will need to monitor hr will add accordingly.   2.  Chronic anticoagulation with Eliquis 5 mg twice a day  -denies missing this.  3. Systolic heart failure, with EF 15%, in acute on chronic heart failure, with severe Lv enlargement  -noncompliant with his medication. Has been on his lisinopril will restart his medications, diuresis  -really may need w/u with [facility]C define his heart as he has not shown up for f/u. 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 ?ICD implant as some point if this therapy is going to be recommended I this extremely sick LV function  -Daily I and O therapy.    4. COPD/ongoing tobacco abuse, encouraged to stop smoking  -inhaler therapy  5. Htn-restart medications and slow titration asx from his htn.  6. Mild dementia as he does not remember [place] even seeing him in 02/2018    He needs social work consultation for med assistance and plan.      We  thank you for the opportunity to participate in the care of this patient.     Electronically signed by: Jimmye Norman, PA       Problem List:   Active Problems:    Atrial fibrillation with RVR        History of Present Illness:   Christopher Dickerson. is a 73 y.o. male history of atrial fibrillation, copd/ongoing smoking, noncompliant medications presents with atrial fibrillation today to the emergency room we saw him 03/02/2018.      At that time we treated with IV Cardizem echocardiogram was  completed with an EF global hypokinesis severely reduced 10 to 15%.  The left atrium was mildly enlarged the right atrium mildly enlarged he had mild dilation of his aorta    Chads vascular score (placed on Eliquis) heart rate was controlled he was not therapeutic on his Coumadin he was switched to Eliquis 5 mg twice a day.      He has a history of hypertension and tobacco abuse.      Our physician consulted reviewed the importance of his medications.  He was placed on Aldactone 12.5 mg daily discontinuing laces.  His Cardizem was discontinued and he was placed on carvedilol 12.5 twice a day he was continued on his ACE inhibitor with lisinopril and it was recommended that he have an outpatient stress test and follow-up in 2 weeks.  He has not followed up since. Does not even remember seeing [place].    He presents today with shortness of breath his chest x-ray shows that he has trace interstitial edema with small left pleural effusion no focal consolidation or pneumothorax.  BNP is 1653 troponin 0 0.04 he was noted to be in extreme hypertension when he arrived. He had tried to go down to [facility] to refill his medications, but they were closing due to power outage  so [facility] and he was sent to the ER.    He relays he has had sob, edema, cough orthopnea for 5days. He does not wt self. He lives in [place] son in and out wife died in the summer. Another daughter lives far away. 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 His friend is trying to help him get Medicaid with his medicare or other assistance.    EKG:   EKG:  Rapid afib with LVH and t wave inversion    Review of Systems:   A comprehensive review of systems was: cough/wheeze orthopnea, edema, arthritis  Otherwise negative except as stated above.    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    Family History:   family history is not on file.    Social History:    reports that he has been smoking cigarettes. He has been smoking about 0.50 packs per day. He has never used smokeless tobacco. He reports current alcohol use. He reports that he does not use drugs.   Few beers a week denies heavy drinking now.    Allergies:   Codeine    Medications:     carvedilol, 25 mg, Oral, Q12H SCH  dilTIAZem, 30 mg, Oral, 4 times per day        Current Facility-Administered Medications   Medication         Physical Exam:     Tmax: Temp (24hrs), Avg:98.6 F (37 C), Min:98.4 F (36.9 C), Max:98.8 F (37.1 C)     BP: (!) 174/106   Heart Rate: 78   SpO2: 98 %    Wt Readings from Last 3 Encounters:   05/18/18 93.9 kg (207 lb)   03/04/18 90.6 kg (199 lb 11.8 oz)   07/04/17 90.5 kg (199 lb 8.3 oz)           No intake or output data in the 24 hours ending 05/18/18 1503    Constitutional -  Chronically ill copd smoker no distress  Eyes - Pupils equal and reactive, extraocular eye movements intact, sclera anicteric  Head -  Normocephalic, atraumatic  Neck - supple, no significant adenopathy, no thyromegaly, no masses  Oropharynx - Moist mucous membranes/poor dentition  Respiratory - diminished air movement with expir wheeze bibasilar crackles.  Cardiovascular system - irreg/irreg rate rapid  Abdomen - soft, nontender, nondistended, no masses or organomegaly  Neurological - Alert, oriented, no focal neurological deficits  Extremities - +2 LE edema  Skin - Warm and dry, no rashes, normal  color   Psych- Appropriate affect      Labs Reviewed:     Recent Labs   Lab 05/18/18  1245   Glucose 115*   BUN 15   Creatinine 1.18   Sodium 140   Potassium 4.9  Chloride 106   CO2 25     Recent Labs   Lab 05/18/18  1245   AST (SGOT) 30   ALT 62*   TSH 2.75         Recent Labs   Lab 05/18/18  1245   WBC 9.4   Hemoglobin 14.9   Hematocrit 44.0   PLT CT 246     Recent Labs   Lab 05/18/18  1245   Troponin I 0.04*     Recent Labs   Lab 05/18/18  1245   B-Natriuretic Peptide 1,653.2*     No results found for: HGBA1CPERCNT   Recent Labs   Lab 05/18/18  1245   PT INR 1.1     Estimated Creatinine Clearance: 67.3 mL/min (based on SCr of 1.18 mg/dL).    Radiology Studies in last 24 hours:   Xr Chest Ap Portable    Result Date: 05/18/2018  Cardiomegaly with trace interstitial edema and small left effusion. No focal consolidation or pneumothorax. ReadingStation:[id number]

## 2018-05-18 NOTE — Progress Notes (Addendum)
NURSE NOTE SUMMARY  [facility] - SURG TELEMETRY STEP-DOWN   Patient Name: Christopher Dickerson, Christopher SR.   Attending Physician: Georgia Duff, DO   Today's date:   05/18/2018 LOS: 0 days   Shift Summary:                                                              1530- Pt arrived to room 315 from ED. Pt offers no complaints, no distress noted. Pt oriented to room and staff. Telemetry shows A-fib. BP 166/114. Priscella Mann in to see pt, discussing medications with pt. CBIR, will continue to monitor.     1830- BP now 158/106. Pt denies SOB or chest pain. Continues in A-fib, HR 80-90's. Needs addressed. CBIR.   Provider Notifications:      Rapid Response Notifications:  Mobility:          Weight tracking:  Family Dynamic:   Last 3 Weights for the past 72 hrs (Last 3 readings):   Weight   05/18/18 1515 93.8 kg (206 lb 12.7 oz)   05/18/18 1234 93.9 kg (207 lb)             Recent Vitals Last Bowel Movement   BP: (!) 158/106 (05/18/2018  6:05 PM)  Heart Rate: 87 (05/18/2018  6:05 PM)  Temp: 97.5 F (36.4 C) (05/18/2018  3:15 PM)  Resp Rate: 19 (05/18/2018  3:15 PM)  Height: 1.829 m (6') (05/18/2018 12:34 PM)  Weight: 93.8 kg (206 lb 12.7 oz) (05/18/2018  3:15 PM)  SpO2: 96 % (05/18/2018  3:15 PM)   Last BM Date: 05/18/18

## 2018-05-18 NOTE — ED Triage Notes (Signed)
Pt brought in by EMS from [facility] for SOB x one week. Pt has not had his Lasix in over a week because he states he ran out. Pt is in rapid Afib upon arrival. Pt denies CP. Denies any pain.

## 2018-05-18 NOTE — ED Provider Notes (Signed)
Physician/Midlevel provider first contact with patient: 05/18/18 1227         History     Chief Complaint   Patient presents with   . Shortness of Breath       Christopher Cossin Sr.  is a 73 y.o. male presenting with shortness of breath, orthopnea, PND and bilateral ankle edema:    SOB, orthopnea, PND, bilateral ankle edema: Patient with pre-existing atrial fibrillation, cardiomyopathy with an EF between 10 - 15%, COPD and continued tobacco abuse.  Pt reports one week of medication non-compliance (taking only Eliquis and Lisinopril, last dose this AM) with associated progressive worsening shortness of breath, orthopnea, PND and 1 day ago of increased ankle edema. He reports 1 week of non compliance with all medications other than Eliquis and Lisinopril. He reports non-compliance as a result of not being able to obtain refill or office appts.  Reports he presented to [facility]  in attempt to get refill.  UC notified EMS secondary rapid afib and hypoxia with reported O2 sat of 85%. EMS reports UC  placed pt on 1 L NCO2, corrected to 97%. EMS reports en route pt's O2 sat reamanied >95% on room air. Pt  denies chest pain, subjective palpatations, cough or diaphoresis.    AFIB: > 5 years. Denies CAD, MI.     03/03/2018 Echo:  Conclusions        1: The left ventricle is moderate to severely dilated at 6.3 cm.                       2: Global hypokinesis with left ventricular ejection fraction severely decreased, estimated at 10-15%.                       3: The right ventricular size is mildy increased.                       4: The right ventricular systolic function is mildly decreased.                       5: The left atrium is moderately enlarged.                       6: The right atrium is mildly enlarged.                       7: There is mild aortic dilatation at the level of the sinuses of Valsalva.                       8: Normal respiratory variation of the inferior vena cava is present.                        9: No evidence of an atrial septal defect by color doppler.                       10: Early closure of the mitral valve suggesting elevated left ventricular end-diastolic pressure.                       11: No significant pericardial effusion detected.            SHx: Positive for tob use, occasional EtOH.     Past Medical History:   Diagnosis  Date   . Arthritis    . Atrial fibrillation    . Cardiomyopathy    . Chronic obstructive pulmonary disease    . Hypertension    . 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 APPENDECTOMY     . COLONOSCOPY, POLYPECTOMY  12/16/2012    Procedure: COLONOSCOPY, POLYPECTOMY;  Surgeon: Gwenith Spitz, MD;  Location: [facility];  Service: Gastroenterology;  Laterality: N/A;   . EGD, BIOPSY  12/16/2012    Procedure: EGD, BIOPSY;  Surgeon: Gwenith Spitz, MD;  Location: [facility];  Service: Gastroenterology;  Laterality: N/A;   . KNEE ARTHROSCOPY W/ ACL RECONSTRUCTION     . LIFT, ARM (MEDICAL)         History reviewed. No pertinent family history.    Social  Social History     Tobacco Use   . Smoking status: Current Every Day Smoker     Packs/day: 0.50     Types: Cigarettes   . Smokeless tobacco: Never Used   Substance Use Topics   . Alcohol use: Yes     Comment: occasional   . Drug use: No       .     Allergies   Allergen Reactions   . Codeine        Home Medications     Med List Status:  Pharmacy Completed Set By: Quintin Alto, PharmD at 05/18/2018  2:31 PM        Status Comment        05/18/2018  2:33 PM    Pt was not sure what he was currently taking. All PTA medications are per his only known outpatient pharmacy as well as his dispense report. Pt was on aldactone and coreg per his last discharge but has not had those filled since 03/04/2018 for 15 and 30 day supplies respectively.                  albuterol (PROVENTIL HFA;VENTOLIN HFA) 108 (90 Base) MCG/ACT inhaler     Inhale 2  puffs into the lungs every 6 (six) hours as needed (wheezing)     apixaban (ELIQUIS) 5 MG     Take 5 mg by mouth every 12 (twelve) hours     fluticasone (FLONASE) 50 MCG/ACT nasal spray     1 spray by Nasal route 2 (two) times daily as needed (allergies)     lisinopril (PRINIVIL,ZESTRIL) 20 MG tablet     Take 20 mg by mouth every morning        omeprazole (PRILOSEC) 40 MG capsule     Take 40 mg by mouth every morning.            Ongoing Comment    Quintin Alto, PharmD    05/18/2018  2:33 PM    05/18/2018 - Pt was not sure what he was currently taking. All PTA medications are per his only known outpatient pharmacy as well as his dispense report. Pt was on aldactone and coreg per his last discharge but has not had those filled since 03/04/2018 for 15 and 30 day supplies respectively.    ---------------------------------------------------------------------------------------------------------------------------    Pt's warfarin previously man aged by [facility] antico[facility]; however, pt discharge from clinic due to noncompliance. Pt still taking 5mg  daily without monitoring.            Review of Systems  Constitutional: Negative for activity change, chills, diaphoresis and fever.   HENT: Negative for congestion, ear pain and sore throat.    Respiratory: Positive for shortness of breath. Negative for cough.         Orthopnea and PND   Cardiovascular: Positive for leg swelling (bilateral ankle edema). Negative for chest pain.   Gastrointestinal: Negative for abdominal pain, blood in stool (Melena neg), diarrhea, nausea and vomiting.   Genitourinary: Negative for dysuria and flank pain.   Musculoskeletal:        Denies Injuries   Skin: Negative for rash.   Neurological: Negative for weakness and numbness.   Psychiatric/Behavioral: Negative for behavioral problems.   All other systems reviewed and are negative.      Physical Exam    BP: (!) 159/115, Heart Rate: (!) 131, Temp: 98.8 F (37.1 C), Resp Rate: 20, SpO2: 98 %,  Weight: 93.9 kg     Physical Exam  Vitals signs and nursing note reviewed. Exam conducted with a chaperone present.   Constitutional:       Appearance: Normal appearance. He is well-developed and normal weight.   HENT:      Head: Normocephalic and atraumatic.      Right Ear: Tympanic membrane normal.      Left Ear: Tympanic membrane normal.      Nose: Nose normal.      Mouth/Throat:      Mouth: Mucous membranes are moist.      Pharynx: Oropharynx is clear.      Comments: Mostly edentulous   Neck:      Musculoskeletal: Normal range of motion and neck supple.   Cardiovascular:      Rate and Rhythm: Tachycardia present. Rhythm irregular.      Pulses: Normal pulses.      Heart sounds: Normal heart sounds.      Comments: 2+ pulses throughout.  Pulmonary:      Effort: Pulmonary effort is normal.      Breath sounds: Decreased breath sounds and rhonchi (bilateral bases) present.   Abdominal:      General: Abdomen is flat. Bowel sounds are normal.      Palpations: Abdomen is soft.      Tenderness: There is no abdominal tenderness. There is no right CVA tenderness or left CVA tenderness.   Musculoskeletal: Normal range of motion.      Right lower leg: 1+ Pitting Edema present.      Left lower leg: 1+ Pitting Edema present.   Skin:     General: Skin is warm and dry.   Neurological:      General: No focal deficit present.      Mental Status: He is alert and oriented to person, place, and time.      Motor: No abnormal muscle tone.   Psychiatric:         Mood and Affect: Mood normal.         Behavior: Behavior normal.         Thought Content: Thought content normal.         Judgment: Judgment normal.         I have interpreted the EKG at the time it was performed and my official reading is as follows:     Last EKG Result     Procedure Component Value Units Date/Time    ECG 12 lead (Specify reason for exam) [[id number]] Collected:  05/18/18 1233     Updated:  05/18/18 1754  Patient Age 55 years      Patient DOB Feb 25, 1946      Patient Height --     Patient Weight --     Interpretation Text --     Atrial fibrillation with RVR  Left ventricular hypertrophy with repolarization abnormality  Compared to ECG 03/02/2018 14:15:47  Resolution of occasional PVC's  Otherwise minimal change      Electronically Signed On 05-18-2018 17:52:53 EST by Burna Cash       Physician Interpreter Burna Cash     Ventricular Rate 137 //min      QRS Duration 103 ms      P-R Interval -- ms      Q-T Interval 306 ms      Q-T Interval(Corrected) 462 ms      P Wave Axis -- deg      QRS Axis 59 deg      T Axis 136 years           DDX     DDX Shortness of breath: includes but is not limited to upper airway foreign body, epiglottitis, retropharyngeal abscess, laryngospasm, vocal cord dysfunction, laryngeal malignancy, COPD exacerbation,  asthma exacerbation, bronchitis, pneumonia, pneumothorax, pulmonary embolism, pulmonary hypertension, malignancy, pulmonary fibrosis, pleural effusion, congestive heart failure, acute mitral valve regurgitation, aortic stenosis, unstable angina, acute coronary syndrome, acute myocardial infarction, arrhythmia, cardiomyopathy, pericarditis, myocarditis, pericardial effusion, anemia, diabetic ketoacidosis, anxiety, panic attack.      ED Course     ED Medication Orders (From admission, onward)    Start Ordered     Status Ordering Provider    05/18/18 2000 05/18/18 1402    Every 12 hours cardiac     Route: Oral  Ordered Dose: 25 mg     Discontinued Anber Mckiver K    05/18/18 1403 05/18/18 1402  dilTIAZem (CARDIZEM) injection 10 mg  Once in ED     Route: Intravenous  Ordered Dose: 10 mg     Last MAR action:  Given Rudy Domek K    05/18/18 1344 05/18/18 1343  furosemide (LASIX) injection 40 mg  Once in ED     Route: Intravenous  Ordered Dose: 40 mg     Last MAR action:  Given Kerstyn Coryell K    05/18/18 1255 05/18/18 1254  dilTIAZem (CARDIZEM) injection 10 mg  Once in ED     Route: Intravenous  Ordered Dose: 10 mg     Last MAR  action:  Given Yohann Curl K    05/18/18 1255 05/18/18 1254    4 times per day     Route: Oral  Ordered Dose: 30 mg     Discontinued Derrel Moore K                Procedures    Results     Labs Reviewed   COMPREHENSIVE METABOLIC PANEL - Abnormal; Notable for the following components:       Result Value    Glucose 115 (*)     Protein, Total 5.9 (*)     Albumin 3.3 (*)     ALT 62 (*)     All other components within normal limits   CBC AND DIFFERENTIAL - Abnormal; Notable for the following components:    Lymphocytes 14.1 (*)     All other components within normal limits   TROPONIN I - Abnormal; Notable for the following components:    Troponin I 0.04 (*)     All other components within normal limits  B-TYPE NATRIURETIC PEPTIDE - Abnormal; Notable for the following components:    B-Natriuretic Peptide 1,653.2 (*)     All other components within normal limits   TROPONIN I - Abnormal; Notable for the following components:    Troponin I 0.05 (*)     All other components within normal limits   TROPONIN I - Abnormal; Notable for the following components:    Troponin I 0.06 (*)     All other components within normal limits   CBC AND DIFFERENTIAL - Abnormal; Notable for the following components:    WBC 13.4 (*)     Lymphocytes 13.1 (*)     Neutrophils Absolute 10.0 (*)     All other components within normal limits   COMPREHENSIVE METABOLIC PANEL - Abnormal; Notable for the following components:    Glucose 119 (*)     Creatinine 1.44 (*)     Albumin 3.1 (*)     EGFR 48 (*)     All other components within normal limits   THYROID STIMULATING HORMONE (TSH), REFLEX ON ABNORMAL TO FREE T4, SERUM   PT AND APTT       XR Chest AP Portable   Final Result   Cardiomegaly with trace interstitial edema and small left effusion. No focal consolidation or pneumothorax.      ReadingStation:[id number]          Lab and Radiology results discussed with patient.       MDM   ED course: Unclear regular medications list Last D/C med list includes  Coreg 25 mg twice daily. One dose given.  Short term control of the patient's heart rate and blood pressure managed with diltiazem 10 mg boluses x2 + 30 mg p.o.  Initiation of diuresis with 40 mg of Lasix.  (Patient reports he took both Eliquis and lisinopril 10 mg this morning)    Shortness of breath, PND, orthopnea, ankle edema, noncompliance with medication,  minimal elevation in troponin,  elevated BNP:   Suspect CHF presentation secondary to severe undelying cardiomyopathy with superimposed noncompliance with regular rate control, HTN and diuretic medications. Resultant A. Fib/RVR  with resulting congestive heart failure.  Suspect mild elevation of troponin secondary demand ischemia.  Low suspicion for pulmonary embolism with patient reporting compliance with Eliquis.  The patient does not have an AICD.  Cardiology consult obtained by admitting service.      ALT elevation: Suspect nonspecific. Serial lab indicated.    Discussed with hospitalist.    Questions answered at bedside.     Vitals: Initial hypertension improved, initial tachycardia improved, transient tachypnea, afebrile O2 sat 95-99% on room air    Clinical Impression & Disposition     Clinical Impression  Final diagnoses:   Acute congestive heart failure, unspecified heart failure type   SOB (shortness of breath)   Atrial fibrillation with rapid ventricular response   Noncompliance with medication regimen   Hypertension, unspecified type        ED Disposition     ED Disposition Condition Date/Time Comment    Admit  Mon May 18, 2018  2:16 PM Admitting Physician: Georgia Duff [[id number]]   Diagnosis: Atrial fibrillation with RVR [604540]   Estimated Length of Stay: > or = to 2 midnights   Tentative Discharge Plan?: Home or Self Care [1]   Patient Class (>51 yrs old): Inpatient [101]             Current Discharge Medication List  No follow-up provider specified.    The documentation recorded by my scribe, Alois Cliche, accurately reflects  the services I personally performed and the decisions made by me Burna Cash, MD         Cloyde Reams Lum Keas, MD  05/19/18 1252

## 2018-05-18 NOTE — ED Notes (Signed)
Pt updated on plan of care. No changes from initial assessment. Pt denies any needs at this time. Pt on tele box [id number]. Tele monitor room notified. Pt awaiting transport to floor.

## 2018-05-18 NOTE — H&P (Addendum)
Medicine History & Physical   [facility]  [employer]   Patient Name: Christopher Dickerson, Christopher SR. LOS: 0 days   Attending Physician: Bernette Redbird, MD PCP: Gerilyn Pilgrim, MD   Please call Dr. Tyson Babinski at [phone] with any questions or concerns.      Assessment and Plan:                                                              #Atrial fib w/ RVR  --likely due to running out of meds  --admit for tele; given cardizem in ER  --not taking home meds; restarted home BB  --on eliquis  --cards consult to assist w/ optimizing meds     #AECHF  --mild w/ some xray effusion and LE swelling  --diuretics--iv lasix  --daily weights  --strict input and output  --fluid restriction a day  --sodium restriction <2g a day  --cardioprotective meds: ace and BB  --EF; last echo--EF of 10-15%, global hypokinesis  --probnp--1653.2  --cards consulted to assist w/ medical management  --he has not been taking his diuretic    #Tobacco Abuse  --nicotine patch offered for nicotine withdrawal  --educated on cessation     #COPD  --not in exacerbation  --continue home inhalers    #HTN  --chronic medical condition; stable; continue home medications with no changes      DVT PPx: Anticoagulated   Dispo: Inpatient    Code: full code     History of Presenting Illness                                CC: shortness of breath  Christopher Oki Sr. is a 73 y.o. male patient with PMH of a. Fib on eliquis, CHF-systolic w/ EF of 15%, CMO, COPD, tobacco abuse, and HTN who presents to ER w/ complaints of worsening sob w/ exertion and some LE edema. He apparently ran out of meds about a week ago and tried to get them filled w/ Dr. Leanora Ivanoff office today but was told to come to ER. In ER he was found to be in a. Fib w/ RVR and some exacerbation of CHF w/ interstitial edema and increased bnp. In ER he was not complaining of chest pain but was having some more sob and stated he noticed some weight gain and he couldn't lay flat. He  admits that he hasn't had most of his meds in about a week. He was started on some oral cardizem, IV lasix in ER. He denied any chest pain and no palpitations. He was not hypoxic and was in no respiratory distress.   Past Medical History:   Diagnosis Date   . Arthritis    . Atrial fibrillation    . Cardiomyopathy    . Chronic obstructive pulmonary disease    . Hypertension    . 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 APPENDECTOMY     . COLONOSCOPY, POLYPECTOMY  12/16/2012    Procedure: COLONOSCOPY, POLYPECTOMY;  Surgeon: Gwenith Spitz, MD;  Location: [facility];  Service: Gastroenterology;  Laterality: N/A;   . EGD,  BIOPSY  12/16/2012    Procedure: EGD, BIOPSY;  Surgeon: Gwenith Spitz, MD;  Location: [facility];  Service: Gastroenterology;  Laterality: N/A;   . KNEE ARTHROSCOPY W/ ACL RECONSTRUCTION     . LIFT, ARM (MEDICAL)       History reviewed. No pertinent family history.  Social History     Tobacco Use   . Smoking status: Current Every Day Smoker     Packs/day: 0.50     Types: Cigarettes   . Smokeless tobacco: Never Used   Substance Use Topics   . Alcohol use: Yes     Comment: occasional   . Drug use: No       Subjective   Review of Systems:  All systems were reviewed and are negative unless pertinent positive stated in HPI.  CONSTITUTIONAL: No night sweats. No fatigue. No fever or chills.   Eyes: No visual changes. No eye pain.   ENT: No runny nose. No epistaxis.   RESPIRATORY: No cough. No hemoptysis. shortness of breath w/ exertion  CARDIOVASCULAR: No chest pains. No palpitations.   GASTROINTESTINAL: No abdominal pain. No vomiting. No diarrhea or constipation.   GENITOURINARY: No urgency. No frequency. No dysuria.   MUSCULOSKELETAL: No musculoskeletal pain. No joint swelling.   NEUROLOGICAL: No headache or neck pain. No syncope or seizure.   PSYCHIATRIC: No depression, no psychosis.  SKIN: No rashes. No  lesions. No petechiae.  ENDOCRINE: No unexplained weight loss. No polydipsia.   HEMATOLOGIC: No anemia. No purpura. No bleeding.   ALLERGIC AND IMMUNOLOGIC: No pruritus. No swelling.         Objective   Physical Exam:     Vitals: T:98.8 F (37.1 C) (Oral),  BP:(!) 165/120, HR:(!) 107, RR:21, SaO2:95%    1) General Appearance: Alert and oriented x 4. In no acute distress.   2) Eyes: Pink conjunctiva, anicteric sclera. Pupils are equally reactive to light.  3) ENT: Oral mucosa moist with no pharyngeal congestion, erythema or swelling.  4) Neck: Supple, with full range of motion. Trachea is central, no JVD noted  5) Chest: crackles b/l, no wheezes or rhonchi.  6) CVS: irregularly irregular, with no murmurs.  7) Abdomen: Soft, non-tender, no palpable mass. Bowel sounds normal. No CVA tenderness  8) Extremities: No pitting edema, pulses palpable, no calf swelling and no gross deformity.  9) Skin: Warm, dry with normal skin turgor, no rash  10) Lymphatics: No lymphadenopathy in axillary, cervical and inguinal area.   11) Neurological: Cranial nerves II-XII intact. No gross focal motor or sensory deficits noted.  12) Psychiatric: Affect is appropriate. No hallucinations.    Chest Xray: Per my review shows cardiomegaly w/ interstitial edmea    Patient Vitals for the past 12 hrs:   BP Temp Pulse Resp   05/18/18 1424 (!) 165/120 - (!) 107 21   05/18/18 1342 (!) 160/121 - (!) 112 (!) 24   05/18/18 1318 - - 98 17   05/18/18 1312 (!) 156/119 - (!) 105 17   05/18/18 1302 - - (!) 131 -   05/18/18 1301 (!) 136/103 - (!) 127 (!) 27   05/18/18 1239 (!) 159/115 - (!) 145 (!) 29   05/18/18 1234 (!) 159/115 98.8 F (37.1 C) (!) 131 20     Weight Monitoring 07/03/2017 07/03/2017 07/04/2017 03/02/2018 03/03/2018 03/04/2018 05/18/2018   Height 182.9 cm - - 182.9 cm - - 182.9 cm   Height Method - - - - - - Stated  Weight 97.1 kg 93.6 kg 90.5 kg 99.791 kg 91.9 kg 90.6 kg 93.895 kg   Weight Method - Standing Scale Standing Scale - Standing Scale  Standing Scale Estimated   BMI (calculated) 29.1 kg/m2 - - 29.9 kg/m2 - - 28.1 kg/m2          Recent Results (from the past 24 hour(s))   ECG 12 lead (Specify reason for exam)    Collection Time: 05/18/18 12:33 PM   Result Value Ref Range    Patient Age 38 years    Patient DOB 1946-03-27     Patient Height      Patient Weight      Interpretation Text       Atrial fibrillation  Probable LVH with secondary repol abnrm  Anterior Q waves, possibly due to LVH  Compared to ECG 03/02/2018 14:15:47  Q waves now present  Ventricular premature complex(es) no longer present      Physician Interpreter      Ventricular Rate 137 //min    QRS Duration 103 ms    P-R Interval  ms    Q-T Interval 306 ms    Q-T Interval(Corrected) 462 ms    P Wave Axis  deg    QRS Axis 59 deg    T Axis 136 years   Comprehensive metabolic panel    Collection Time: 05/18/18 12:45 PM   Result Value Ref Range    Sodium 140 136 - 147 mMol/L    Potassium 4.9 3.5 - 5.3 mMol/L    Chloride 106 98 - 110 mMol/L    CO2 25 20 - 30 mMol/L    Calcium 8.8 8.5 - 10.5 mg/dL    Glucose 540 (H) 71 - 99 mg/dL    Creatinine 9.81 1.91 - 1.30 mg/dL    BUN 15 7 - 22 mg/dL    Protein, Total 5.9 (L) 6.0 - 8.3 gm/dL    Albumin 3.3 (L) 3.5 - 5.0 gm/dL    Alkaline Phosphatase 98 40 - 145 U/L    ALT 62 (H) 0 - 55 U/L    AST (SGOT) 30 10 - 42 U/L    Bilirubin, Total 1.0 0.1 - 1.2 mg/dL    Albumin/Globulin Ratio 1.27 0.80 - 2.00 Ratio    Anion Gap 13.9 7.0 - 18.0 mMol/L    BUN/Creatinine Ratio 12.7 10.0 - 30.0 Ratio    EGFR 61 60 - 150 mL/min/1.82m2    Osmolality Calculated 281 275 - 300 mOsm/kg    Globulin 2.6 2.0 - 4.0 gm/dL   CBC and differential    Collection Time: 05/18/18 12:45 PM   Result Value Ref Range    WBC 9.4 4.0 - 11.0 K/cmm    RBC 4.62 4.00 - 5.70 M/cmm    Hemoglobin 14.9 13.0 - 17.5 gm/dL    Hematocrit 47.8 29.5 - 52.5 %    MCV 95 80 - 100 fL    MCH 32 28 - 35 pg    MCHC 34 32 - 36 gm/dL    RDW 62.1 30.8 - 65.7 %    PLT CT 246 130 - 440 K/cmm    MPV 8.0 6.0 - 10.0 fL     NEUTROPHIL % 73.7 42.0 - 78.0 %    Lymphocytes 14.1 (L) 15.0 - 46.0 %    Monocytes 8.8 3.0 - 15.0 %    Eosinophils % 2.7 0.0 - 7.0 %    Basophils % 0.7 0.0 - 3.0 %    Neutrophils Absolute  6.9 1.7 - 8.6 K/cmm    Lymphocytes Absolute 1.3 0.6 - 5.1 K/cmm    Monocytes Absolute 0.8 0.1 - 1.7 K/cmm    Eosinophils Absolute 0.3 0.0 - 0.8 K/cmm    BASO Absolute 0.1 0.0 - 0.3 K/cmm   Troponin I    Collection Time: 05/18/18 12:45 PM   Result Value Ref Range    Troponin I 0.04 (H) 0.00 - 0.02 ng/mL   B-type Natriuretic Peptide    Collection Time: 05/18/18 12:45 PM   Result Value Ref Range    B-Natriuretic Peptide 1,653.2 (H) 0.0 - 100.0 pg/mL   TSH, Abn Reflex to Free T4, Serum    Collection Time: 05/18/18 12:45 PM   Result Value Ref Range    T4 Free Not Indicated 0.70 - 1.48 ng/dL    TSH 1.61 0.96 - 0.45 uIU/mL   PT/APTT    Collection Time: 05/18/18 12:45 PM   Result Value Ref Range    PT 11.1 9.5 - 11.5 sec    PT INR 1.1 0.5 - 1.3    aPTT 29.4 24.0 - 34.0 sec        Allergies   Allergen Reactions   . Codeine       Xr Chest Ap Portable    Result Date: 05/18/2018  Cardiomegaly with trace interstitial edema and small left effusion. No focal consolidation or pneumothorax. ReadingStation:[email or web address]     Home Medications     Med List Status:  In Progress Set By: Collier Flowers, RN at 05/18/2018  2:12 PM                albuterol (PROVENTIL HFA;VENTOLIN HFA) 108 (90 Base) MCG/ACT inhaler     Inhale 2 puffs into the lungs every 6 (six) hours as needed (wheezing)     apixaban (ELIQUIS) 5 MG     Take 5 mg by mouth every 12 (twelve) hours     fluticasone (FLONASE) 50 MCG/ACT nasal spray     1 spray by Nasal route 2 (two) times daily as needed (allergies)     lisinopril (PRINIVIL,ZESTRIL) 20 MG tablet     Take 20 mg by mouth every morning        omeprazole (PRILOSEC) 40 MG capsule     Take 40 mg by mouth every morning.         spironolactone (ALDACTONE) 25 MG tablet     Take 25 mg by mouth 2 (two) times daily        Ongoing Comment     Quintin Alto, PharmD    05/18/2018  2:29 PM    05/18/2018 - Pt was not sure what he was currently taking. All PTA medications are per his only known outpatient pharmacy as well as his dispense report.       ---------------------------------------------------------------------------------------------------------------------------    Pt's warfarin previously managed by [facility] anticoag clinic; however, pt discharge from clinic due to noncompliance. Pt still taking 5mg  daily without monitoring.          Meds given in the ED:  Medications   dilTIAZem (CARDIZEM) tablet 30 mg (30 mg Oral Given 05/18/18 1302)   carvedilol (COREG) tablet 25 mg (has no administration in time range)   dilTIAZem (CARDIZEM) injection 10 mg (10 mg Intravenous Given 05/18/18 1303)   furosemide (LASIX) injection 40 mg (40 mg Intravenous Given 05/18/18 1421)   dilTIAZem (CARDIZEM) injection 10 mg (10 mg Intravenous Given 05/18/18 1417)      Time Spent: 40 minutes  Georgia Duff, DO     05/18/18,2:30 PM   MRN: [id number]                                      CSN: [id number] DOB: [birth year]

## 2018-05-19 LAB — CBC AND DIFFERENTIAL
Basophils %: 0.6 % (ref 0.0–3.0)
Basophils Absolute: 0.1 10*3/uL (ref 0.0–0.3)
Eosinophils %: 3.3 % (ref 0.0–7.0)
Eosinophils Absolute: 0.4 10*3/uL (ref 0.0–0.8)
Hematocrit: 44.8 % (ref 39.0–52.5)
Hemoglobin: 14.5 gm/dL (ref 13.0–17.5)
Lymphocytes Absolute: 1.8 10*3/uL (ref 0.6–5.1)
Lymphocytes: 13.1 % — ABNORMAL LOW (ref 15.0–46.0)
MCH: 31 pg (ref 28–35)
MCHC: 32 gm/dL (ref 32–36)
MCV: 97 fL (ref 80–100)
MPV: 7.8 fL (ref 6.0–10.0)
Monocytes Absolute: 1.2 10*3/uL (ref 0.1–1.7)
Monocytes: 8.7 % (ref 3.0–15.0)
Neutrophils %: 74.3 % (ref 42.0–78.0)
Neutrophils Absolute: 10 10*3/uL — ABNORMAL HIGH (ref 1.7–8.6)
PLT CT: 252 10*3/uL (ref 130–440)
RBC: 4.62 10*6/uL (ref 4.00–5.70)
RDW: 13.5 % (ref 11.0–14.0)
WBC: 13.4 10*3/uL — ABNORMAL HIGH (ref 4.0–11.0)

## 2018-05-19 LAB — COMPREHENSIVE METABOLIC PANEL
ALT: 54 U/L (ref 0–55)
AST (SGOT): 24 U/L (ref 10–42)
Albumin/Globulin Ratio: 1 Ratio (ref 0.80–2.00)
Albumin: 3.1 gm/dL — ABNORMAL LOW (ref 3.5–5.0)
Alkaline Phosphatase: 93 U/L (ref 40–145)
Anion Gap: 13.7 mMol/L (ref 7.0–18.0)
BUN / Creatinine Ratio: 12.5 Ratio (ref 10.0–30.0)
BUN: 18 mg/dL (ref 7–22)
Bilirubin, Total: 1.1 mg/dL (ref 0.1–1.2)
CO2: 26 mMol/L (ref 20–30)
Calcium: 8.8 mg/dL (ref 8.5–10.5)
Chloride: 105 mMol/L (ref 98–110)
Creatinine: 1.44 mg/dL — ABNORMAL HIGH (ref 0.80–1.30)
EGFR: 48 mL/min/{1.73_m2} — ABNORMAL LOW (ref 60–150)
Globulin: 3.1 gm/dL (ref 2.0–4.0)
Glucose: 119 mg/dL — ABNORMAL HIGH (ref 71–99)
Osmolality Calculated: 282 mOsm/kg (ref 275–300)
Potassium: 4.7 mMol/L (ref 3.5–5.3)
Protein, Total: 6.2 gm/dL (ref 6.0–8.3)
Sodium: 140 mMol/L (ref 136–147)

## 2018-05-19 MED ORDER — DEXTROMETHORPHAN POLISTIREX ER 30 MG/5ML PO SUER
30.00 mg | Freq: Two times a day (BID) | ORAL | Status: DC
Start: 2018-05-19 — End: 2018-05-19

## 2018-05-19 MED ORDER — GUAIFENESIN 100 MG/5ML PO SOLN
200.00 mg | ORAL | Status: DC | PRN
Start: 2018-05-19 — End: 2018-05-27
  Administered 2018-05-19 – 2018-05-25 (×6): 200 mg via ORAL
  Filled 2018-05-19 (×9): qty 10

## 2018-05-19 MED ORDER — APIXABAN 5 MG PO TABS
5.0000 mg | ORAL_TABLET | Freq: Two times a day (BID) | ORAL | Status: DC
Start: 2018-05-20 — End: 2018-05-19

## 2018-05-19 MED ORDER — DEXTROMETHORPHAN POLISTIREX ER 30 MG/5ML PO SUER
30.00 mg | Freq: Two times a day (BID) | ORAL | Status: DC | PRN
Start: 2018-05-19 — End: 2018-05-27
  Administered 2018-05-19 – 2018-05-25 (×4): 30 mg via ORAL
  Filled 2018-05-19 (×6): qty 5

## 2018-05-19 MED ORDER — APIXABAN 5 MG PO TABS
5.0000 mg | ORAL_TABLET | Freq: Once | ORAL | Status: DC
Start: 2018-05-19 — End: 2018-05-19
  Filled 2018-05-19: qty 1

## 2018-05-19 MED ORDER — APIXABAN 5 MG PO TABS
5.0000 mg | ORAL_TABLET | Freq: Two times a day (BID) | ORAL | Status: DC
Start: 2018-05-19 — End: 2018-05-19
  Filled 2018-05-19: qty 1

## 2018-05-19 NOTE — Progress Notes (Addendum)
Cardiology Progress Note       Date Time: 05/19/18 11:30 AM  Patient Name: Christopher Dickerson, Christopher SR.    LOS: 1 days      Assessment / Plan     1.  A fib- rate now controlled on BB and Eliquis  2.  Acute on chronic systolic heart failure EF 15%  3.  COPD/tobacco abuse  4.  Hypertension  5.  Mild elevation of creat 1.44  6. Noncompliance with all meds incl eliquis  PLAN:  He is agreeable to proceed with THC this admission.  Will hold Eliquis  make NPO.  Continue with diuresis.  Plan THC in AM if he is able to lay without orthopnea.   Repeat BMP in AM    Dr Manson Passey will do cath and follow him    Restart eliquis after cath          Problem List:   Active Problems:    Atrial fibrillation with RVR    Acute on chronic systolic heart failure    COPD    hypertension      Subjective   Subjective :   Sitting on side of bed.  States breathing is improving.  Still not able to lay flat but is able to lay lower than admission.  Continues to diurese.  Denies any CP, or palpitations.  Remains in a fib with rate controlled.  He had his Eliquis this AM and has been eating.  He is agreeable to have a [facility] tomorrow if he can lay flat without dyspnea.          Objective     Physical Exam:     Tmax: Temp (24hrs), Avg:98 F (36.7 C), Min:96.6 F (35.9 C), Max:98.8 F (37.1 C)     BP: (!) 155/111   Heart Rate: (!) 103   SpO2: 96 %    Wt Readings from Last 3 Encounters:   05/19/18 93.3 kg (205 lb 9.6 oz)   03/04/18 90.6 kg (199 lb 11.8 oz)   07/04/17 90.5 kg (199 lb 8.3 oz)               Intake/Output Summary (Last 24 hours) at 05/19/2018 1130  Last data filed at 05/19/2018 1000  Gross per 24 hour   Intake 350 ml   Output 2150 ml   Net -1800 ml         Constitutional -  Well appearing, male in no distress  Eyes - Pupils equal and reactive, extraocular eye movements intact, sclera anicteric  Respiratory - coarse breath sounds throughout, no rales or wheezes, normal respiratory effort  Cardiovascular - irregularly irregular rhythm,  controlled rate, no murmur  Abdomen - soft, nontender, nondistended, no masses or organomegaly  Neurological - Alert, oriented, no focal neurological deficits  Extremities -  1+ peripheral pulses, trace lower extremity edema  Skin - Warm and dry, no rashes, normal color         Medications:   apixaban, 5 mg, Oral, Q12H  famotidine, 20 mg, Oral, BID  furosemide, 40 mg, Intravenous, BID  lisinopril, 10 mg, Oral, BID  metoprolol tartrate, 50 mg, Oral, Q6H  nicotine, 1 patch, Transdermal, Daily  sodium chloride (PF), 3 mL, Intravenous, Q8H      Current Facility-Administered Medications   Medication         Labs:     Recent Labs   Lab 05/19/18  0333 05/18/18  1245   Glucose 119* 115*   BUN 18 15  Creatinine 1.44* 1.18   Sodium 140 140   Potassium 4.7 4.9   Chloride 105 106   CO2 26 25     Recent Labs   Lab 05/19/18  0333 05/18/18  1245   AST (SGOT) 24 30   ALT 54 62*   TSH  --  2.75         Recent Labs   Lab 05/19/18  0333 05/18/18  1245   WBC 13.4* 9.4   Hemoglobin 14.5 14.9   Hematocrit 44.8 44.0   PLT CT 252 246     Recent Labs   Lab 05/18/18  1813 05/18/18  1532 05/18/18  1245   Troponin I 0.06* 0.05* 0.04*     Recent Labs   Lab 05/18/18  1245   B-Natriuretic Peptide 1,653.2*     No results found for: HGBA1CPERCNT   Recent Labs   Lab 05/18/18  1245   PT INR 1.1     Estimated Creatinine Clearance: 55 mL/min (A) (based on SCr of 1.44 mg/dL (H)).    Radiology tests last 24 hours:   Xr Chest Ap Portable    Result Date: 05/18/2018  Cardiomegaly with trace interstitial edema and small left effusion. No focal consolidation or pneumothorax. ReadingStation:[email or web address]             Signed by: Leverne Humbles MD

## 2018-05-19 NOTE — Progress Notes (Signed)
Medicine Progress Note   [facility]  [employer]   Patient Name: Christopher Dickerson, Christopher SR. LOS: 1 days   Attending Physician: Obie Dredge, * PCP: Gerilyn Pilgrim, MD      Hospital Course:                                                            Christopher Fenton Sr. is a 73 y.o. male patient with PMH of A. Fib on Eliquis, CHF-systolic with LVEF of 15%, presumed nonischemic cardiomyopathy, COPD, tobacco abuse, , essential and hypertension who presents to ER with complaints of worsening dyspnea on exertion and some LE edema. He apparently ran out of meds about a week ago and tried to get them filled with  Dr. Leanora Ivanoff office but was told to come to ER.   In ER he was found to be in a. Fib with  RVR and evidence of exacerbation of CHF ; x-ray showing interstitial edema and  had increased BNP (as persistently high BNP in previous records) .   He was seen by cardiologist ; also noted patient's noncompliance with medications and medical follow-up: Reportedly patient did not follow-up outpatient plan for total heart cath [facility])  to define his underlying cardiomyopathy.      Assessment and Plan:    Permanent atrial fib with  RVR  --Running out of his medications with a history of  medical noncompliance  --Rates controlled and IV Cardizem infusion discontinued  --Continue on metoprolol  --on Eliquis  --Cardio consult on board  --Monitor on telemetry    Acute on chronic systolic heart failure decompensation setting of presumed nonischemic cardiomyopathy with LVEF of 10 to 15% by TTE in 2018 and 2019  --Patient did not follow-up for total heart catheterization to define underlying etiology of his cardiomyopathy  --Likely decompensation of his heart failure to medication noncompliance  --Improving of his dyspnea and leg swelling  --Continue IV Lasix  --Continue on ACE inhibitor and beta-blocker  --daily weights  --strict input and output  --fluid restriction a day  --Cardio  consult on board and patient will need to continue optimizing medical management and evaluation for possible ICD implant  --Patient also follows with  heart failure clinic    Acute renal insufficiency likely prerenal versus cardiorenal syndrome  --Slight worsening of creatinine after initiation of diuretics  --We will monitor renal function test    Tobacco Abuse  --nicotine patch offered for nicotine withdrawal  --educated on cessation     COPD ongoing tobacco use   --not in exacerbation  --continue home inhalers    Hypertension  --Controlled  --Continue on lisinopril and metoprolol    Suspicion of mild cognitive impairment (MCI)  --Patient will need appropriate neurocognitive evaluation as outpatient    Disposition: Continue patient care; anticipate discharge in 1 to 2 days  DVT PPX:  Anticoagulated  Code:  Full Code     Subjective   Patient states he still feels short of breath.  Denies chest pain.  Denies palpitation.  Denies numbness or weakness extremities.        Objective   Physical Exam:       Vitals: T:98.6 F (37 C) (Oral), BP:(!) 151/94, HR:97, RR:18, SaO2:94%    General: Patient is awake. In no acute distress.  HEENT: No conjunctival drainage, vision is intact, anicteric sclera.  Neck: Supple, no thyromegaly.  Chest: CTA bilaterally. No rhonchi, no wheezing. No use of accessory muscles.  CVS: Normal rate and regular rhythm no murmurs, without JVD, +1 legs no pitting edema, pulses palpable.  Abdomen: Soft, non-tender, no guarding or rigidity, with normal bowel sounds.  Extremities: No calf swelling and no gross deformity.  Skin: Warm, dry, no rash and no worrisome lesions.  NEURO: No motor or sensory deficits.  Psychiatric: Alert, interactive, appropriate, normal affect.  Weight Monitoring 07/04/2017 03/02/2018 03/03/2018 03/04/2018 05/18/2018 05/18/2018 05/19/2018   Height - 182.9 cm - - 182.9 cm - -   Height Method - - - - Stated - -   Weight 90.5 kg 99.791 kg 91.9 kg 90.6 kg 93.895 kg 93.8 kg 93.26 kg    Weight Method Standing Scale - Standing Scale Standing Scale Estimated Standing Scale -   BMI (calculated) - 29.9 kg/m2 - - 28.1 kg/m2 - -         Intake/Output Summary (Last 24 hours) at 05/19/2018 0957  Last data filed at 05/18/2018 2146  Gross per 24 hour   Intake    Output 1150 ml   Net -1150 ml     Body mass index is 27.88 kg/m.     Meds:     Current Facility-Administered Medications   Medication Dose Route Frequency    apixaban  5 mg Oral Q12H    famotidine  20 mg Oral BID    furosemide  40 mg Intravenous BID    lisinopril  10 mg Oral BID    metoprolol tartrate  50 mg Oral Q6H    nicotine  1 patch Transdermal Daily    sodium chloride (PF)  3 mL Intravenous Q8H       PRN Meds: acetaminophen **OR** acetaminophen **OR** acetaminophen, albuterol, dextromethorphan polistirex ER, guaiFENesin, naloxone, ondansetron **OR** ondansetron.     LABS:     Estimated Creatinine Clearance: 55 mL/min (A) (based on SCr of 1.44 mg/dL (H)).  Recent Labs   Lab 05/19/18  0333 05/18/18  1245   WBC 13.4* 9.4   RBC 4.62 4.62   Hemoglobin 14.5 14.9   Hematocrit 44.8 44.0   MCV 97 95   PLT CT 252 246     Recent Labs   Lab 05/18/18  1245   PT 11.1   PT INR 1.1   aPTT 29.4     Recent Labs   Lab 05/18/18  1813 05/18/18  1532 05/18/18  1245   Troponin I 0.06* 0.05* 0.04*     No results found for: HGBA1CPERCNT  Recent Labs   Lab 05/19/18  0333 05/18/18  1245   Glucose 119* 115*   Sodium 140 140   Potassium 4.7 4.9   Chloride 105 106   CO2 26 25   BUN 18 15   Creatinine 1.44* 1.18   EGFR 48* 61   Calcium 8.8 8.8     Recent Labs   Lab 05/19/18  0333 05/18/18  1245   Albumin 3.1* 3.3*   Protein, Total 6.2 5.9*   Bilirubin, Total 1.1 1.0   Alkaline Phosphatase 93 98   ALT 54 62*   AST (SGOT) 24 30           Invalid input(s):  AMORPHOUSUA   Patient Lines/Drains/Airways Status    Active PICC Line / CVC Line / PIV Line / Drain / Airway / Intraosseous Line / Epidural Line / ART  Line / Line / Wound / Pressure Ulcer / NG/OG Tube     Name:    Placement date:   Placement time:   Site:   Days:    Peripheral IV 05/18/18 Left Forearm   05/18/18    1245    Forearm   less than 1               Xr Chest Ap Portable    Result Date: 05/18/2018  Cardiomegaly with trace interstitial edema and small left effusion. No focal consolidation or pneumothorax. ReadingStation:[email or web address]     Home Health Needs:  There are no questions and answers to display.       Nutrition assessment done in collaboration with Registered Dietitians:     Time spent:      Obie Dredge, MD     05/19/18,9:57 AM   MRN: [id number]                                      CSN: [id number] DOB: 19-Dec-1945

## 2018-05-19 NOTE — Plan of Care (Signed)
Problem: Moderate/High Fall Risk Score >5  Goal: Patient will remain free of falls  Outcome: Progressing  Flowsheets (Taken 05/18/2018 2007 by Meyer Russel, RN)  Moderate Risk (6-13): LOW-Fall Interventions Appropriate for Low Fall Risk

## 2018-05-19 NOTE — Progress Notes (Signed)
Heart Failure Pharmacist Education Encounter    Patient Name:  Christopher Dickerson  PMH:  Atrial fibrillation, HFrEF, HTN, COPD, tobacco use    Most Recent Ejection Fraction:  10-15%  Admission Brain Natriuretic Peptide (BNP):  1653      Medication Counseling:  Visited patient to discuss his heart medications. Claims he was taking lisinopril, furosemide, and apixaban as prescribed. His fill history seems to support this claim. However, he was not taking carvedilol because he claims he could not reach his PCP to authorize more refills [facility] He went to the office and person and was sent to the hospital for afib w/ RVR. I sympathized with his frustrations with his PCP office but explained that carvedilol is his primary drug for rate control and that missing it likely caused this event. I explained that pharmacies will often provide an emergency supply of drug if waiting for a refill. We also talked about how uncontrolled afib and fluid build up are closely related. Recommended weighing himself daily to detect change in fluid status. He seems to understand this, although he still does not remember his last admission in November and diverted his gaze for majority of the encounter. He does not endorse any medication related adverse events.    Mr. Deziel was concerned about 2 things. One is that he lives off SS and would like to apply for medicaid for copays. Recommend medassist screen and notified his CM. Two is that he was requesting a sleeping pill to help him sleep.     Signed:  Drucilla Schmidt, PharmD, BCPS [phone]

## 2018-05-19 NOTE — UM Notes (Signed)
Mcg criteria used m-505    Dx-afib rvr ,chf      Hx-tobacco, copd, chf ,htn    presents to ER w/ complaints of worsening sob w/ exertion and some LE edema. He apparently ran out of meds about a week ago and tried to get them filled w/ Dr. Leanora Ivanoff office today but was told to come to ER.In ER he was found to be in a. Fib w/ RVR and some exacerbation of CHF w/ interstitial edema and increased bnp. In ER he was not complaining of chest pain but was having some more sob and stated he noticed some weight gain and he couldn't lay flat. He admits that he hasn't had most of his meds in about a week.     98.8  Hr 131  rr 29  159/115  Sat 97%    cxray  IMPRESSION:   Cardiomegaly with trace interstitial edema and small left effusion. No focal consolidation or pneumothorax.    Results for Christopher Dickerson, Christopher Dickerson. (MRN [id number]) as of 05/19/2018 13:21   Ref. Range 05/18/2018 12:45 05/18/2018 15:32 05/18/2018 18:13   Troponin I Latest Ref Range: 0.00 - 0.02 ng/mL 0.04 (H) 0.05 (H) 0.06 (H)   B-Natriuretic Peptide Latest Ref Range: 0.0 - 100.0 pg/mL 1,653.2 (H)           diuretics--iv lasix 40 bid   --daily weights  --strict input and output  --fluid restriction a day  --sodium restriction <2g a day  --cardioprotective meds: ace and BB    See from cards   .  Atrial fibrillation, rapid rate Hr 137  -restart his bblocker will use metoprolol with titration parameters.  -will need to monitor hr will add accordingly.   2.  Chronic anticoagulation with Eliquis 5 mg twice a day  -denies missing this.  3. Systolic heart failure, with EF 15%, in acute on chronic heart failure, with severe Lv enlargement  -noncompliant with his medication. Has been on his lisinopril will restart his medications, diuresis  -really may need w/u with [facility] define his heart as he has not shown up for f/u. 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 ?ICD implant as some point if this therapy  is going to be recommended I this extremely sick LV function  -Daily I and O therapy.    4. COPD/ongoing tobacco abuse, encouraged to stop smoking  -inhaler therapy  5. Htn-restart medications and slow titration asx from his htn.  6. Mild dementia as he does not remember [place] even seeing him in 02/2018      Going to take the patient to the cath lab

## 2018-05-20 ENCOUNTER — Encounter
Admission: EM | Disposition: A | Discharge status: Home / Self Care | Payer: Self-pay | Source: Home / Self Care | Attending: Internal Medicine

## 2018-05-20 HISTORY — PX: RIGHT & LEFT HEART CATH: CATH5

## 2018-05-20 LAB — CBC AND DIFFERENTIAL
Basophils %: 0.7 % (ref 0.0–3.0)
Basophils Absolute: 0.1 10*3/uL (ref 0.0–0.3)
Eosinophils %: 3.4 % (ref 0.0–7.0)
Eosinophils Absolute: 0.4 10*3/uL (ref 0.0–0.8)
Hematocrit: 43.6 % (ref 39.0–52.5)
Hemoglobin: 14.6 gm/dL (ref 13.0–17.5)
Lymphocytes Absolute: 2.3 10*3/uL (ref 0.6–5.1)
Lymphocytes: 20.2 % (ref 15.0–46.0)
MCH: 32 pg (ref 28–35)
MCHC: 34 gm/dL (ref 32–36)
MCV: 96 fL (ref 80–100)
MPV: 7.8 fL (ref 6.0–10.0)
Monocytes Absolute: 0.9 10*3/uL (ref 0.1–1.7)
Monocytes: 8.3 % (ref 3.0–15.0)
Neutrophils %: 67.4 % (ref 42.0–78.0)
Neutrophils Absolute: 7.6 10*3/uL (ref 1.7–8.6)
PLT CT: 249 10*3/uL (ref 130–440)
RBC: 4.55 10*6/uL (ref 4.00–5.70)
RDW: 13.2 % (ref 11.0–14.0)
WBC: 11.3 10*3/uL — ABNORMAL HIGH (ref 4.0–11.0)

## 2018-05-20 LAB — CBC
Hematocrit: 43 % (ref 39.0–52.5)
Hemoglobin: 14.6 gm/dL (ref 13.0–17.5)
MCH: 32 pg (ref 28–35)
MCHC: 34 gm/dL (ref 32–36)
MCV: 95 fL (ref 80–100)
MPV: 8 fL (ref 6.0–10.0)
PLT CT: 246 10*3/uL (ref 130–440)
RBC: 4.52 10*6/uL (ref 4.00–5.70)
RDW: 13.2 % (ref 11.0–14.0)
WBC: 10 10*3/uL (ref 4.0–11.0)

## 2018-05-20 LAB — BASIC METABOLIC PANEL
Anion Gap: 12.9 mMol/L (ref 7.0–18.0)
Anion Gap: 13.5 mMol/L (ref 7.0–18.0)
BUN / Creatinine Ratio: 16.9 Ratio (ref 10.0–30.0)
BUN / Creatinine Ratio: 16.2 Ratio (ref 10.0–30.0)
BUN: 23 mg/dL — ABNORMAL HIGH (ref 7–22)
BUN: 23 mg/dL — ABNORMAL HIGH (ref 7–22)
CO2: 26 mMol/L (ref 20–30)
CO2: 24.3 mMol/L (ref 20.0–30.0)
Calcium: 8.9 mg/dL (ref 8.5–10.5)
Calcium: 8.5 mg/dL (ref 8.5–10.5)
Chloride: 105 mMol/L (ref 98–110)
Chloride: 105 mMol/L (ref 98–110)
Creatinine: 1.36 mg/dL — ABNORMAL HIGH (ref 0.80–1.30)
Creatinine: 1.42 mg/dL — ABNORMAL HIGH (ref 0.80–1.30)
EGFR: 52 mL/min/{1.73_m2} — ABNORMAL LOW (ref 60–150)
EGFR: 49 mL/min/{1.73_m2} — ABNORMAL LOW (ref 60–150)
Glucose: 113 mg/dL — ABNORMAL HIGH (ref 71–99)
Glucose: 159 mg/dL — ABNORMAL HIGH (ref 71–99)
Osmolality Calculated: 284 mOsm/kg (ref 275–300)
Osmolality Calculated: 285 mOsm/kg (ref 275–300)
Potassium: 3.9 mMol/L (ref 3.5–5.3)
Potassium: 3.8 mMol/L (ref 3.5–5.3)
Sodium: 140 mMol/L (ref 136–147)
Sodium: 139 mMol/L (ref 136–147)

## 2018-05-20 LAB — MAGNESIUM: Magnesium: 1.7 mg/dL (ref 1.6–2.6)

## 2018-05-20 LAB — I-STAT ACT KAOLIN: ACT POCT: 241 s

## 2018-05-20 SURGERY — RIGHT & LEFT HEART CATH
Anesthesia: Anesthesia Choice

## 2018-05-20 MED ORDER — ASPIRIN 81 MG PO CHEW
CHEWABLE_TABLET | ORAL | Status: AC
Start: 2018-05-20 — End: 2018-05-20
  Administered 2018-05-20: 16:00:00 324 mg via ORAL
  Filled 2018-05-20: qty 4

## 2018-05-20 MED ORDER — MIDAZOLAM HCL 1 MG/ML IJ SOLN (WRAP)
INTRAMUSCULAR | Status: AC
Start: 2018-05-20 — End: 2018-05-20
  Administered 2018-05-20: 16:00:00 1 mg via INTRAVENOUS
  Filled 2018-05-20: qty 2

## 2018-05-20 MED ORDER — HYDRALAZINE HCL 50 MG PO TABS
50.0000 mg | ORAL_TABLET | Freq: Three times a day (TID) | ORAL | Status: DC
Start: 2018-05-20 — End: 2018-05-21
  Administered 2018-05-20 – 2018-05-21 (×3): 50 mg via ORAL
  Filled 2018-05-20 (×7): qty 1

## 2018-05-20 MED ORDER — ALPRAZOLAM 0.5 MG PO TABS
0.50 mg | ORAL_TABLET | Freq: Once | ORAL | Status: AC
Start: 2018-05-20 — End: 2018-05-20
  Filled 2018-05-20: qty 1

## 2018-05-20 MED ORDER — HEPARIN (PORCINE) IN NACL 2-0.9 UNIT/ML-% IJ SOLN (WRAP)
INTRAVENOUS | Status: DC
Start: 2018-05-20 — End: 2018-05-20
  Filled 2018-05-20: qty 500

## 2018-05-20 MED ORDER — VH HEPARIN 10,000 UNITS/1000 ML NS
INJECTION | INTRAVENOUS | Status: AC
Start: 2018-05-20 — End: 2018-05-20
  Filled 2018-05-20: qty 1000

## 2018-05-20 MED ORDER — METOPROLOL SUCCINATE ER 50 MG PO TB24
100.00 mg | ORAL_TABLET | Freq: Two times a day (BID) | ORAL | Status: DC
Start: 2018-05-20 — End: 2018-05-25
  Administered 2018-05-20 – 2018-05-25 (×10): 100 mg via ORAL
  Filled 2018-05-20 (×11): qty 2

## 2018-05-20 MED ORDER — FUROSEMIDE 40 MG PO TABS
40.0000 mg | ORAL_TABLET | Freq: Every day | ORAL | Status: DC
Start: 2018-05-20 — End: 2018-05-23
  Administered 2018-05-20 – 2018-05-23 (×4): 40 mg via ORAL
  Filled 2018-05-20 (×4): qty 1

## 2018-05-20 MED ORDER — VERAPAMIL HCL 2.5 MG/ML IV SOLN
INTRAVENOUS | Status: AC
Start: 2018-05-20 — End: 2018-05-20
  Administered 2018-05-20: 16:00:00 2.5 mg via INTRA_ARTERIAL
  Filled 2018-05-20: qty 2

## 2018-05-20 MED ORDER — VH HEPARIN 10,000 UNITS/1000 ML NS
INJECTION | INTRAVENOUS | Status: DC
Start: 2018-05-20 — End: 2018-05-20
  Filled 2018-05-20: qty 1000

## 2018-05-20 MED ORDER — APIXABAN 5 MG PO TABS
5.0000 mg | ORAL_TABLET | Freq: Two times a day (BID) | ORAL | Status: DC
Start: 2018-05-20 — End: 2018-05-26
  Administered 2018-05-20 – 2018-05-26 (×12): 5 mg via ORAL
  Filled 2018-05-20 (×13): qty 1

## 2018-05-20 MED ORDER — ALPRAZOLAM 0.5 MG PO TABS
0.5000 mg | ORAL_TABLET | Freq: Every evening | ORAL | Status: DC | PRN
Start: 2018-05-20 — End: 2018-05-21
  Administered 2018-05-20: 22:00:00 0.5 mg via ORAL
  Filled 2018-05-20: qty 1

## 2018-05-20 MED ORDER — VH SPIRONOLACTONE 25 MG PO TABS
25.0000 mg | ORAL_TABLET | Freq: Every day | ORAL | Status: DC
Start: 2018-05-20 — End: 2018-05-25
  Administered 2018-05-20 – 2018-05-25 (×6): 25 mg via ORAL
  Filled 2018-05-20 (×6): qty 1

## 2018-05-20 MED ORDER — HEPARIN (PORCINE) IN NACL 2-0.9 UNIT/ML-% IJ SOLN (WRAP)
INTRAVENOUS | Status: AC
Start: 2018-05-20 — End: 2018-05-20
  Filled 2018-05-20: qty 500

## 2018-05-20 MED ORDER — FENTANYL CITRATE (PF) 50 MCG/ML IJ SOLN (WRAP)
INTRAMUSCULAR | Status: AC
Start: 2018-05-20 — End: 2018-05-20
  Administered 2018-05-20: 16:00:00 50 ug via INTRAVENOUS
  Filled 2018-05-20: qty 2

## 2018-05-20 MED ORDER — VERAPAMIL HCL 2.5 MG/ML IV SOLN
INTRAVENOUS | Status: DC
Start: 2018-05-20 — End: 2018-05-20
  Filled 2018-05-20: qty 2

## 2018-05-20 MED ORDER — HEPARIN SODIUM (PORCINE) 1000 UNIT/ML IJ SOLN
INTRAMUSCULAR | Status: DC
Start: 2018-05-20 — End: 2018-05-20
  Filled 2018-05-20: qty 10

## 2018-05-20 MED ORDER — HEPARIN SODIUM (PORCINE) 1000 UNIT/ML IJ SOLN
INTRAMUSCULAR | Status: AC
Start: 2018-05-20 — End: 2018-05-20
  Administered 2018-05-20: 17:00:00 8000 [IU] via INTRAVASCULAR
  Filled 2018-05-20: qty 10

## 2018-05-20 MED ORDER — LISINOPRIL 5 MG PO TABS
2.5000 mg | ORAL_TABLET | Freq: Every day | ORAL | Status: DC
Start: 2018-05-20 — End: 2018-05-21
  Administered 2018-05-20 – 2018-05-21 (×2): 2.5 mg via ORAL
  Filled 2018-05-20 (×2): qty 1

## 2018-05-20 MED ORDER — SODIUM CHLORIDE 0.9 % IV SOLN
INTRAVENOUS | Status: AC
Start: 2018-05-20 — End: 2018-05-20

## 2018-05-20 NOTE — Progress Notes (Signed)
Readmission Risk  [facility] - SURG TELEMETRY STEP-DOWN   Patient Name: Christopher Dickerson, Christopher SR.   Attending Physician: Clabe Seal, MD   Today's date:   05/20/2018 LOS: 2 days   Expected Discharge Date Expected Discharge Date: 05/21/18    Readmission Assessment:                                                              Discharge Planning  Expected Discharge Date: 05/21/18  ReAdmit Risk Score: 14  Does the patient have perscription coverage?: Yes  Utilize [place] [facility]: No(uses [facility])  Confirmed PCP with Pt: Yes  Confirmed PCP name: Therapist, music to F/U Appt.: Self/Private Vehicle/Friend  Social Work Referral: Not Applicable  Anticipated Home Health at [facility]: No, Refused  Anticipated Placement at [facility]: No  CM Comments: 05/20/2018 RNCM--Admitted with afib with RVR. Telemetry monitoring. On eliquis prior to admission, but now is on hold for heart cath today. Cardiology following. Pt on room air. Plan for [place] is home with son. Declines HH. Independent PTA. CM to follow and assist as needed  Advance Directive Readdressed: No      Health Care Agent: Victorino December - Daughter - [phone]   IDPA:   Patient Type  Within 30 Days of Previous Admission?: No  Healthcare Decisions  Interviewed:: Patient  Orientation/Decision Making Abilities of Patient: Alert and Oriented x3, able to make decisions  Advance Directive: Patient has advance directive, copy not in chart  Advance Directive not in Chart: Copy requested from family/decision maker  Healthcare Agent Appointed: No  Prior to admission  Prior level of function: Independent with ADLs, Ambulates independently  Type of Residence: Private residence  Home Layout: One level, Stairs to enter with rails (add number in comment)(4)  Have running water, electricity, heat, etc?: Yes  Living Arrangements: Children  How do you get to your MD appointments?: drives  How do you get your groceries?: drives  Who fixes your meals?: self/son  Who does your  laundry?: self/son  Who picks up your prescriptions?: self  Dressing: Independent  Grooming: Independent  Feeding: Independent  Bathing: Independent  Toileting: Independent  DME Currently at Home: [id number], [address], [place], [employer], Biomedical scientist, Manual, BP Cuff  Home Care/Community Services: None  Discharge Planning  Support Systems: Children, Family members  Patient expects to be discharged to:: Home  Anticipated  plan discussed with:: Same as interviewed  Mode of transportation:: Private car (family member)  Does the patient have perscription coverage?: Yes  Consults/Providers  PT Evaluation Needed: No  OT Evalulation Needed: No  SLP Evaluation Needed: No  Correct PCP listed in Epic?: Yes  PCP  PCP on file was verified as the current PCP?: Yes   30 Day Readmission:       Provider Notifications:      Calla Kicks, RN, BSN  Case Manager  [phone]  [phone]: fax

## 2018-05-20 NOTE — Progress Notes (Signed)
[facility]  PROGRESS NOTE      Patient: Christopher Tester Sr.  Date: 05/20/2018   LOS: 2 Days  Admission Date: 05/18/2018   MRN: [id number]  Attending: Clabe Seal, MD       INTERIM SUMMARY           SUBJECTIVE     Christopher Fenton Sr. thinks breathing is improving.  Denies chest pain.    NAD otherwise      ASSESSMENT/PLAN     Christopher Cuffe Sr. is a 73 y.o. male     # Acute on chronic congestive heart failure, related to cardiomyopathy   - Cardiology consulted, plan for cardiac cath today   - LVEF 10-15% (02/2018)   - IV diuresis held today due to elevation in Cr and planned cath,  plan to resume tomorrow  - daily weights  - strict intake and output  - NPO for cath, will resume salt/fluid restricted diet s/p cath     CKD 3 -- hold ace and lasix today bc of iv contrast for heart cath    # Atrial fibrillation, rate controlled   - continue beta blocker  - resume eliquis later    -    # COPD  - continue PRN inhalers  - not acutely SOB     # Hypertension, uncontrolled  - pt was without medications for 1-2 weeks  - continue home medications, metoprolol and hydralazine  - will optimize home medications   - will continue to monitor       # Tobacco Use  - educated upon cessation  - continue nicotine patch      Disposition: Pending cardiac cath   DVT PPX:  SCDs   Code:            Full Code           Code Status: Full Code             MEDICATIONS     Current Facility-Administered Medications   Medication Dose Route Frequency   . famotidine  20 mg Oral BID   . hydrALAZINE  50 mg Oral Q8H SCH   . metoprolol tartrate  50 mg Oral Q6H   . nicotine  1 patch Transdermal Daily   . sodium chloride (PF)  3 mL Intravenous Q8H       PHYSICAL EXAM     Vitals:    05/20/18 1202   BP: (!) 169/109   Pulse: 98   Resp: 18   Temp: 98.1 F (36.7 C)   SpO2: 99%       Temperature: Temp  Min: 97 F (36.1 C)  Max: 98.6 F (37 C)  Pulse: Pulse  Min: 72  Max: 98  Respiratory: Resp  Min: 16  Max: 18  Non-Invasive  BP: BP  Min: 154/106  Max: 179/104  Pulse Oximetry SpO2  Min: 95 %  Max: 99 %      Constitutional: Seems to be NAD   HEENT: NC/AT, PERRL,EOMI. Neck supple  Cardiovascular: RRR, normal S1 S2, no murmurs, gallops.  Respiratory: Very subtle crackles bibasilarly Normal rate. No retractions or increased work of breathing.  Gastrointestinal: Normal BS, non-distended, soft, non-tender, no rebound or guarding.  Genitourinary: no suprapubic or costovertebral angle tenderness  Musculoskeletal: ROM and motor strength grossly normal. No clubbing, edema.   Skin: no rashes, jaundice , cyanosis  Neurologic: AAO x3.  CN 2-12 grossly intact. No gross motor or sensory deficits  Psychiatric: Affect  and mood appropriate. Cooperative.     LABS     Recent Labs   Lab 05/20/18  0231 05/19/18  0333 05/18/18  1245   WBC 11.3* 13.4* 9.4   RBC 4.55 4.62 4.62   Hemoglobin 14.6 14.5 14.9   Hematocrit 43.6 44.8 44.0   MCV 96 97 95   PLT CT 249 252 246       Recent Labs   Lab 05/20/18  0231 05/19/18  0333 05/18/18  1245   Sodium 140 140 140   Potassium 3.9 4.7 4.9   Chloride 105 105 106   CO2 26 26 25    BUN 23* 18 15   Creatinine 1.36* 1.44* 1.18   Glucose 113* 119* 115*   Calcium 8.9 8.8 8.8   Magnesium 1.7  --   --        Recent Labs   Lab 05/19/18  0333 05/18/18  1245   ALT 54 62*   AST (SGOT) 24 30   Bilirubin, Total 1.1 1.0   Albumin 3.1* 3.3*   Alkaline Phosphatase 93 98       Recent Labs   Lab 05/18/18  1813 05/18/18  1532 05/18/18  1245   Troponin I 0.06* 0.05* 0.04*       Recent Labs   Lab 05/18/18  1245   PT INR 1.1   PT 11.1           Eliott Nine, MD  4:56 PM 05/20/2018

## 2018-05-20 NOTE — Progress Notes (Signed)
Pt arrived to room from cath lab s/p total heart cath. Right radial access site cdi, with TR band in place. No bleeding or hematoma noted. Pulses palpable and pt denies pain, numbness or tingling in RUE. Pt up on EOB eating dinner without complaints. Will continue with frequent neurovascular & vital checks.

## 2018-05-20 NOTE — Progress Notes (Signed)
Cardiology Progress Note       Date Time: 05/20/18 6:21 PM  Patient Name: Christopher Dickerson, Christopher SR.    LOS: 2 days      Assessment / Plan     Hypertension  Atrial fibrillation with RVR  Cardiomyopathy  Coronary artery disease  Plan:  Add ACE inhibitor  Cardiolite stress test to evaluate need for revascularization  LifeVest      Problem List:   Active Problems:    Atrial fibrillation with RVR    SOB (shortness of breath)    Noncompliance with medication regimen    Hypertension, unspecified type    Paroxysmal atrial fibrillation    Acute on chronic systolic congestive heart failure        Subjective   Subjective :   Cath today showed borderline LAD disease  BP is not adequately controlled  Heart rate control is marginal  Patient has no complaints but seems to have poor memory  Spoke with his sister and I do not get a sense that he is a heavy drinker         Objective     Physical Exam:     Tmax: Temp (24hrs), Avg:97.7 F (36.5 C), Min:97 F (36.1 C), Max:98.6 F (37 C)     BP: (!) 160/117   Heart Rate: 92   SpO2: 97 %    Wt Readings from Last 3 Encounters:   05/20/18 92.2 kg (203 lb 4.2 oz)   03/04/18 90.6 kg (199 lb 11.8 oz)   07/04/17 90.5 kg (199 lb 8.3 oz)               Intake/Output Summary (Last 24 hours) at 05/20/2018 1821  Last data filed at 05/20/2018 0300  Gross per 24 hour   Intake    Output 1000 ml   Net -1000 ml         Constitutional -  Well appearing, and in no distress  Eyes - Pupils equal and reactive, extraocular eye movements intact, sclera anicteric  Respiratory -few rales at bases, normal respiratory effort  Cardiovascular -irregularly irregular rate and rhythm with varying S1 and S2 without murmurs rubs or gallops  Abdomen - soft, nontender, nondistended, no masses or organomegaly  Neurological - Alert, oriented, no focal neurological deficits  Extremities -no cyanosis clubbing or edema  Skin - Warm and dry, no rashes, normal color         Medications:   apixaban, 5 mg, Oral, Q12H SCH   famotidine, 20 mg, Oral, BID  furosemide, 40 mg, Oral, Daily  hydrALAZINE, 50 mg, Oral, Q8H SCH  lisinopril, 2.5 mg, Oral, Daily  metoprolol succinate XL, 100 mg, Oral, BID  nicotine, 1 patch, Transdermal, Daily  sodium chloride (PF), 3 mL, Intravenous, Q8H  spironolactone, 25 mg, Oral, Daily      Current Facility-Administered Medications   Medication    sodium chloride         Labs:     Recent Labs   Lab 05/20/18  0231 05/19/18  0333 05/18/18  1245   Glucose 113* 119* 115*   BUN 23* 18 15   Creatinine 1.36* 1.44* 1.18   Sodium 140 140 140   Potassium 3.9 4.7 4.9   Chloride 105 105 106   CO2 26 26 25      Recent Labs   Lab 05/20/18  0231 05/19/18  0333 05/18/18  1245   Magnesium 1.7  --   --    AST (SGOT)  --  24 30   ALT  --  54 62*   TSH  --   --  2.75         Recent Labs   Lab 05/20/18  0231 05/19/18  0333 05/18/18  1245   WBC 11.3* 13.4* 9.4   Hemoglobin 14.6 14.5 14.9   Hematocrit 43.6 44.8 44.0   PLT CT 249 252 246     Recent Labs   Lab 05/18/18  1813 05/18/18  1532 05/18/18  1245   Troponin I 0.06* 0.05* 0.04*     Recent Labs   Lab 05/18/18  1245   B-Natriuretic Peptide 1,653.2*     No results found for: HGBA1CPERCNT   Recent Labs   Lab 05/18/18  1245   PT INR 1.1     Estimated Creatinine Clearance: 53.9 mL/min (A) (based on SCr of 1.36 mg/dL (H)).    Radiology tests last 24 hours:   No results found.           Signed by: Rudean Curt, MD,

## 2018-05-20 NOTE — Student Progress (Signed)
Medicine Progress Note   [facility]     Patient Name: Christopher Dickerson, Christopher SR. LOS: 2 days   Attending Physician: Clabe Seal, MD PCP: Gerilyn Pilgrim, MD      Hospital Course:                                                            [name] is a 73 y.o. male patient with past medical history of hypertension, COPD without oxygen, cardiomyopathy and atrial fibrillation presented to ED with worsening shortness of breath and lower extremity edema.  Pt states that he was unable to get his home medications refilled because he could not get into contact with his PCP.  BNP elevated and CXR demonstrated interstitial edema while in the ED.  Previous had cardiology follow up outpatient but patient did not proceed with plan for cardiac.  Pt admitted for IV diuresis and further evaluation. Earlier this morning telemetry noted a run of V-tach that spontaneous resolved.  Awaiting cardiac cath.      Assessment and Plan:      # Acute on chronic congestive heart failure, related to cardiomyopathy   - Cardiology consulted, plan for cardiac cath today (05/20/18)  - LVEF 10-15% (02/2018)   - IV diuresis held today due to elevation in Cr and planned cath,  plan to resume tomorrow  - daily weights  - strict intake and output  - NPO for cath, will resume salt/fluid restricted diet s/p cath     # Atrial fibrillation, controlled   - continue beta blocker  - resume eliquis s/p cardiac cath   - HR < 100     # COPD  - continue PRN inhalers  - not acutely SOB     # Hypertension, uncontrolled  - pt was without medications for 1-2 weeks  - continue home medications, metoprolol and hydralazine  - will optimize home medications   - will continue to monitor       # Tobacco Use  - educated upon cessation  - continue nicotine patch      Disposition: Pending cardiac cath   DVT PPX:  SCDs   Code:  Full Code     Subjective   Pt states that he is feeling better today and that the swelling in his legs improved.          Objective   Physical Exam:     Vitals: T:98.1 F (36.7 C) (Oral), BP:(!) 169/109, HR:98, RR:18, SaO2:99%    General: Patient is awake. In no acute distress.  HEENT: No conjunctival drainage, vision is intact, anicteric sclera.  Neck: Supple, no thyromegaly.  Chest: Coarse breath sounds bilaterally. No rhonchi, no wheezing. No use of accessory muscles.  CVS: Normal rate and regular rhythm no murmurs, without JVD, no pitting edema, pulses palpable.  Abdomen: Soft, non-tender, no guarding or rigidity, with normal bowel sounds.  Extremities: No calf swelling and no gross deformity.  Skin: Warm, dry, no rash and no worrisome lesions.  NEURO: No motor or sensory deficits.  Psychiatric: Alert, interactive, appropriate, normal affect.  Weight Monitoring 03/02/2018 03/03/2018 03/04/2018 05/18/2018 05/18/2018 05/19/2018 05/20/2018   Height 182.9 cm - - 182.9 cm - - -   Height Method - - - Stated - - -   Weight  99.791 kg 91.9 kg 90.6 kg 93.895 kg 93.8 kg 93.26 kg 92.2 kg   Weight Method - Standing Scale Standing Scale Estimated Standing Scale - Standing Scale   BMI (calculated) 29.9 kg/m2 - - 28.1 kg/m2 - - -         Intake/Output Summary (Last 24 hours) at 05/20/2018 1524  Last data filed at 05/20/2018 0300  Gross per 24 hour   Intake 250 ml   Output 1600 ml   Net -1350 ml     Body mass index is 27.57 kg/m.     Meds:     Current Facility-Administered Medications   Medication Dose Route Frequency    0.9% sodium chloride with heparin        heparin 16109 units/1000 mL sodium chloride 0.9%        famotidine  20 mg Oral BID    hydrALAZINE  50 mg Oral Q8H SCH    metoprolol tartrate  50 mg Oral Q6H    nicotine  1 patch Transdermal Daily    sodium chloride (PF)  3 mL Intravenous Q8H       PRN Meds: acetaminophen **OR** acetaminophen **OR** acetaminophen, albuterol, ALPRAZolam, dextromethorphan polistirex ER, guaiFENesin, naloxone, ondansetron **OR** ondansetron.     LABS:     Estimated Creatinine Clearance: 53.9 mL/min (A)  (based on SCr of 1.36 mg/dL (H)).  Recent Labs   Lab 05/20/18  0231 05/19/18  0333   WBC 11.3* 13.4*   RBC 4.55 4.62   Hemoglobin 14.6 14.5   Hematocrit 43.6 44.8   MCV 96 97   PLT CT 249 252     Recent Labs   Lab 05/18/18  1245   PT 11.1   PT INR 1.1   aPTT 29.4     Recent Labs   Lab 05/18/18  1813 05/18/18  1532 05/18/18  1245   Troponin I 0.06* 0.05* 0.04*     No results found for: HGBA1CPERCNT  Recent Labs   Lab 05/20/18  0231 05/19/18  0333 05/18/18  1245   Glucose 113* 119* 115*   Sodium 140 140 140   Potassium 3.9 4.7 4.9   Chloride 105 105 106   CO2 26 26 25    BUN 23* 18 15   Creatinine 1.36* 1.44* 1.18   EGFR 52* 48* 61   Calcium 8.9 8.8 8.8     Recent Labs   Lab 05/20/18  0231 05/19/18  0333 05/18/18  1245   Magnesium 1.7  --   --    Albumin  --  3.1* 3.3*   Protein, Total  --  6.2 5.9*   Bilirubin, Total  --  1.1 1.0   Alkaline Phosphatase  --  93 98   ALT  --  54 62*   AST (SGOT)  --  24 30           Invalid input(s):  AMORPHOUSUA   Patient Lines/Drains/Airways Status    Active PICC Line / CVC Line / PIV Line / Drain / Airway / Intraosseous Line / Epidural Line / ART Line / Line / Wound / Pressure Ulcer / NG/OG Tube     Name:   Placement date:   Placement time:   Site:   Days:    Peripheral IV 05/18/18 Left Forearm   05/18/18    1245    Forearm   1               Xr Chest Ap Portable  Result Date: 05/18/2018  Cardiomegaly with trace interstitial edema and small left effusion. No focal consolidation or pneumothorax. ReadingStation:[id number]     Home Health Needs:  There are no questions and answers to display.       Nutrition assessment done in collaboration with Registered Dietitians:     Time spent:      [place]     05/20/18,3:24 PM   MRN: [id number]                                      CSN: [id number] DOB: 10-26-1945

## 2018-05-20 NOTE — Progress Notes (Signed)
While in report with oncoming nightshift RN, pt was heard yelling out from his room. Upon entering his room, he was noted to be bleeding profusely from his right radial cath site.    Pt exclaimed that he had undone the velco from the TR band, stating it was "too tight." This RN immediately held pressure to radial site while oncoming RN removed the TR band. Manual pressure was held for 30 minutes & hemostasis was achieved at 1930.     A small hematoma & mild bruising was noted distally of the site on the lateral aspect of pt's wrist. Hematoma was outlined, frequent vitals & neurovascular checks reinitiated. Pt remains without complaints of pain, numbness or tingling to RUE. VSS.

## 2018-05-20 NOTE — Significant Event (Signed)
NURSE NOTE SUMMARY  [facility] - SURG TELEMETRY STEP-DOWN   Patient Name: Christopher Dickerson, Christopher SR.   Attending Physician: Obie Dredge, *   Today's date:   05/20/2018 LOS: 2 days   Shift Summary:                                                                 Provider Notifications:      Rapid Response Notifications:  Mobility:      PMP Activity: Step 7 - Walks out of Room (05/19/2018 10:00 PM)     Weight tracking:  Family Dynamic:   Last 3 Weights for the past 72 hrs (Last 3 readings):   Weight   05/19/18 0446 93.3 kg (205 lb 9.6 oz)   05/18/18 1515 93.8 kg (206 lb 12.7 oz)   05/18/18 1234 93.9 kg (207 lb)             Recent Vitals Last Bowel Movement   BP: (!) 179/104 (05/20/2018  1:31 AM)  Heart Rate: 83 (05/20/2018  1:31 AM)  Temp: 97 F (36.1 C) (05/19/2018 10:25 PM)  Resp Rate: 16 (05/19/2018  7:43 PM)  Weight: 93.3 kg (205 lb 9.6 oz) (05/19/2018  4:46 AM)  SpO2: 97 % (05/19/2018 10:25 PM)   Last BM Date: 05/18/18

## 2018-05-20 NOTE — Progress Notes (Signed)
Readmission Risk  [facility] - SURG TELEMETRY STEP-DOWN   Patient Name: Christopher Dickerson, Christopher SR.   Attending Physician: Clabe Seal, MD   Today's date:   05/20/2018 LOS: 2 days   Expected Discharge Date Expected Discharge Date: 05/21/18    Readmission Assessment:                                                              Discharge Planning  Expected Discharge Date: 05/21/18  ReAdmit Risk Score: 14  Does the patient have perscription coverage?: Yes  Utilize [place] [facility]: No(uses [facility])  Confirmed PCP with Pt: Yes  Confirmed PCP name: Therapist, music to F/U Appt.: Self/Private Vehicle/Friend  Social Work Referral: Not Applicable  Anticipated Home Health at [place]: No, Refused  Anticipated Placement at [place]: No  CM Comments: 05/20/2018 RNCM--Admitted with afib with RVR. Telemetry monitoring. On eliquis prior to admission, but now is hold for heart cath today. Cardiology following. Pt on room air. Plan for [place] is home with son. Declines HH. Independent PTA. CM to follow and assist as needed  Advance Directive Readdressed: No      Health Care Agent: Victorino December - Daughter - [phone]   IDPA:   Patient Type  Within 30 Days of Previous Admission?: No  Healthcare Decisions  Interviewed:: Patient  Orientation/Decision Making Abilities of Patient: Alert and Oriented x3, able to make decisions  Advance Directive: Patient has advance directive, copy not in chart  Advance Directive not in Chart: Copy requested from family/decision maker  Healthcare Agent Appointed: No  Prior to admission  Prior level of function: Independent with ADLs, Ambulates independently  Type of Residence: Private residence  Home Layout: One level, Stairs to enter with rails (add number in comment)(4)  Have running water, electricity, heat, etc?: Yes  Living Arrangements: Children  How do you get to your MD appointments?: drives  How do you get your groceries?: drives  Who fixes your meals?: self/son  Who does your  laundry?: self/son  Who picks up your prescriptions?: self  Dressing: Independent  Grooming: Independent  Feeding: Independent  Bathing: Independent  Toileting: Independent  DME Currently at Home: [id number], [address], [place], [employer], Biomedical scientist, Manual, BP Cuff  Home Care/Community Services: None  Discharge Planning  Support Systems: Children, Family members  Patient expects to be discharged to:: Home  Anticipated [place] plan discussed with:: Same as interviewed  Mode of transportation:: Private car (family member)  Does the patient have perscription coverage?: Yes  Consults/Providers  PT Evaluation Needed: No  OT Evalulation Needed: No  SLP Evaluation Needed: No  Correct PCP listed in Epic?: Yes  PCP  PCP on file was verified as the current PCP?: Yes   30 Day Readmission:       Provider Notifications:      Calla Kicks, RN, BSN  Case Manager  [phone]  [phone]: fax

## 2018-05-20 NOTE — Progress Notes (Signed)
NURSE NOTE SUMMARY  [facility] - SURG TELEMETRY STEP-DOWN   Patient Name: Christopher Dickerson, Christopher SR.   Attending Physician: Obie Dredge, *   Today's date:   05/20/2018 LOS: 2 days   Shift Summary:                                                              40 beats Vtach @0124 . Documented as a significant event. Dr. Barnet Pall notified of Vtach and elevated BP. Mg to be checked with morning labs. Will monitor.    Provider Notifications:      Rapid Response Notifications:  Mobility:      PMP Activity: Step 7 - Walks out of Room (05/19/2018 10:00 PM)     Weight tracking:  Family Dynamic:   Last 3 Weights for the past 72 hrs (Last 3 readings):   Weight   05/19/18 0446 93.3 kg (205 lb 9.6 oz)   05/18/18 1515 93.8 kg (206 lb 12.7 oz)   05/18/18 1234 93.9 kg (207 lb)             Recent Vitals Last Bowel Movement   BP: (!) 179/104 (05/20/2018  1:31 AM)  Heart Rate: 83 (05/20/2018  1:31 AM)  Temp: 97 F (36.1 C) (05/19/2018 10:25 PM)  Resp Rate: 16 (05/19/2018  7:43 PM)  Weight: 93.3 kg (205 lb 9.6 oz) (05/19/2018  4:46 AM)  SpO2: 97 % (05/19/2018 10:25 PM)   Last BM Date: 05/18/18

## 2018-05-20 NOTE — Progress Notes (Signed)
Cardiology Progress Note       Date Time: 05/20/18 12:44 PM  Patient Name: Christopher Dickerson, Christopher SR.    LOS: 2 days      Assessment / Plan     1. Afib- rate controlled on BB and anti- coagulated with Eliquis  2. Acute on chronic systolic heart failure, EF: 15%  3. COPD/Tobacco abuse  4. HTN  5. Mild elevation of creatinine, improved 1.44 --> 1.36  6. Noncompliance with all medications    - THC today to guide further management.   - NPO    Consent Obtained for procedure.  Dr. Manson Passey to assess patient prior to procedure. Questions about procedure answered and risks reviewed.     Previous Anesthetic or sedation adverse reactions: No    Time and Ashby Dawes of Last Oral Intake:  Time of Last oral meal: 00:01 05/20/2018   No Liquid intake in the past 2 hours    ASA Physical Status Classification System:  Class 3 - Severe systemic disease, limits normal activity but not incapacitating    Mallampati Airway classification: Class II: Visibility of hard and soft palate, upper portion of tonsils and uvula    Anesthesia Plan: Moderate Sedation    Estimated Blood Loss: Minimal, <16mL.     Problem List:   Active Problems:    Atrial fibrillation with RVR    SOB (shortness of breath)    Noncompliance with medication regimen    Hypertension, unspecified type    Paroxysmal atrial fibrillation    Acute on chronic systolic congestive heart failure        Subjective   Subjective :   Pt feels well today. His shortness of breath and LE edema have improved. The patient is able to lay flat without orthopnea. No chest pain.          Objective     Physical Exam:     Tmax: Temp (24hrs), Avg:97.7 F (36.5 C), Min:97 F (36.1 C), Max:98.6 F (37 C)     BP: (!) 169/109   Heart Rate: 98   SpO2: 99 %    Wt Readings from Last 3 Encounters:   05/20/18 92.2 kg (203 lb 4.2 oz)   03/04/18 90.6 kg (199 lb 11.8 oz)   07/04/17 90.5 kg (199 lb 8.3 oz)               Intake/Output Summary (Last 24 hours) at 05/20/2018 1244  Last data filed at 05/20/2018  0300  Gross per 24 hour   Intake 490 ml   Output 1600 ml   Net -1110 ml         Constitutional -  Well appearing, and in no distress  Eyes - Pupils equal and reactive, extraocular eye movements intact, sclera anicteric  Respiratory - Coarse breath sounds throughout. No rales or wheezing.   Cardiovascular - Irregular irregular rhythm, normal rate. No gallops or rubs.    Abdomen - soft, nontender, nondistended, no masses or organomegaly  Neurological - Alert, oriented, no focal neurological deficits  Extremities -  Trace LE edema, bilaterally.   Skin - Warm and dry, no rashes, normal color       Medications:   ALPRAZolam, 0.5 mg, Oral, Once  famotidine, 20 mg, Oral, BID  hydrALAZINE, 50 mg, Oral, Q8H SCH  metoprolol tartrate, 50 mg, Oral, Q6H  nicotine, 1 patch, Transdermal, Daily  sodium chloride (PF), 3 mL, Intravenous, Q8H      Current Facility-Administered Medications   Medication  Labs:     Recent Labs   Lab 05/20/18  0231 05/19/18  0333 05/18/18  1245   Glucose 113* 119* 115*   BUN 23* 18 15   Creatinine 1.36* 1.44* 1.18   Sodium 140 140 140   Potassium 3.9 4.7 4.9   Chloride 105 105 106   CO2 26 26 25      Recent Labs   Lab 05/20/18  0231 05/19/18  0333 05/18/18  1245   Magnesium 1.7  --   --    AST (SGOT)  --  24 30   ALT  --  54 62*   TSH  --   --  2.75         Recent Labs   Lab 05/20/18  0231 05/19/18  0333 05/18/18  1245   WBC 11.3* 13.4* 9.4   Hemoglobin 14.6 14.5 14.9   Hematocrit 43.6 44.8 44.0   PLT CT 249 252 246     Recent Labs   Lab 05/18/18  1813 05/18/18  1532 05/18/18  1245   Troponin I 0.06* 0.05* 0.04*     Recent Labs   Lab 05/18/18  1245   B-Natriuretic Peptide 1,653.2*     No results found for: HGBA1CPERCNT   Recent Labs   Lab 05/18/18  1245   PT INR 1.1     Estimated Creatinine Clearance: 53.9 mL/min (A) (based on SCr of 1.36 mg/dL (H)).    Radiology tests last 24 hours:   No results found.           Signed by: Barron Alvine, PA,

## 2018-05-21 ENCOUNTER — Encounter: Payer: Self-pay | Admitting: Cardiovascular Disease

## 2018-05-21 ENCOUNTER — Inpatient Hospital Stay: Payer: Medicare PPO

## 2018-05-21 LAB — CBC AND DIFFERENTIAL
Basophils %: 0.3 % (ref 0.0–3.0)
Basophils Absolute: 0 10*3/uL (ref 0.0–0.3)
Eosinophils %: 2.7 % (ref 0.0–7.0)
Eosinophils Absolute: 0.3 10*3/uL (ref 0.0–0.8)
Hematocrit: 46.6 % (ref 39.0–52.5)
Hemoglobin: 15.3 gm/dL (ref 13.0–17.5)
Lymphocytes Absolute: 1.1 10*3/uL (ref 0.6–5.1)
Lymphocytes: 10.7 % — ABNORMAL LOW (ref 15.0–46.0)
MCH: 32 pg (ref 28–35)
MCHC: 33 gm/dL (ref 32–36)
MCV: 96 fL (ref 80–100)
MPV: 7.7 fL (ref 6.0–10.0)
Monocytes Absolute: 0.9 10*3/uL (ref 0.1–1.7)
Monocytes: 8.6 % (ref 3.0–15.0)
Neutrophils %: 77.6 % (ref 42.0–78.0)
Neutrophils Absolute: 8.2 10*3/uL (ref 1.7–8.6)
PLT CT: 238 10*3/uL (ref 130–440)
RBC: 4.85 10*6/uL (ref 4.00–5.70)
RDW: 13.3 % (ref 11.0–14.0)
WBC: 10.5 10*3/uL (ref 4.0–11.0)

## 2018-05-21 LAB — BASIC METABOLIC PANEL
Anion Gap: 16.3 mMol/L (ref 7.0–18.0)
BUN / Creatinine Ratio: 15.8 Ratio (ref 10.0–30.0)
BUN: 23 mg/dL — ABNORMAL HIGH (ref 7–22)
CO2: 23 mMol/L (ref 20–30)
Calcium: 8.7 mg/dL (ref 8.5–10.5)
Chloride: 105 mMol/L (ref 98–110)
Creatinine: 1.46 mg/dL — ABNORMAL HIGH (ref 0.80–1.30)
EGFR: 47 mL/min/{1.73_m2} — ABNORMAL LOW (ref 60–150)
Glucose: 103 mg/dL — ABNORMAL HIGH (ref 71–99)
Osmolality Calculated: 283 mOsm/kg (ref 275–300)
Potassium: 4.3 mMol/L (ref 3.5–5.3)
Sodium: 140 mMol/L (ref 136–147)

## 2018-05-21 MED ORDER — LISINOPRIL 5 MG PO TABS
5.0000 mg | ORAL_TABLET | Freq: Every day | ORAL | Status: DC
Start: 2018-05-22 — End: 2018-05-21

## 2018-05-21 MED ORDER — CLONIDINE HCL 0.1 MG PO TABS
0.1000 mg | ORAL_TABLET | Freq: Three times a day (TID) | ORAL | Status: DC
Start: 2018-05-21 — End: 2018-05-22
  Administered 2018-05-21 – 2018-05-22 (×3): 0.1 mg via ORAL
  Filled 2018-05-21 (×6): qty 1

## 2018-05-21 MED ORDER — REGADENOSON 0.4 MG/5ML IV SOLN
INTRAVENOUS | Status: AC
Start: 2018-05-21 — End: ?
  Filled 2018-05-21: qty 5

## 2018-05-21 MED ORDER — MIRTAZAPINE 15 MG PO TABS
15.0000 mg | ORAL_TABLET | Freq: Every evening | ORAL | Status: DC
Start: 2018-05-21 — End: 2018-05-27
  Administered 2018-05-21 – 2018-05-26 (×6): 15 mg via ORAL
  Filled 2018-05-21 (×8): qty 1

## 2018-05-21 MED ORDER — REGADENOSON 0.4 MG/5ML IV SOLN
0.40 mg | Freq: Once | INTRAVENOUS | Status: AC
Start: 2018-05-21 — End: 2018-05-21
  Administered 2018-05-21: 13:00:00 0.4 mg via INTRAVENOUS
  Filled 2018-05-21: qty 5

## 2018-05-21 MED ORDER — TECHNETIUM TC 99M SESTAMIBI - CARDIOLITE
32.30 | Freq: Once | Status: AC | PRN
Start: 2018-05-21 — End: 2018-05-21
  Administered 2018-05-21: 13:00:00 32.3 via INTRAVENOUS

## 2018-05-21 MED ORDER — HYDRALAZINE HCL 50 MG PO TABS
100.0000 mg | ORAL_TABLET | Freq: Three times a day (TID) | ORAL | Status: DC
Start: 2018-05-21 — End: 2018-05-23
  Administered 2018-05-21 – 2018-05-23 (×6): 100 mg via ORAL
  Filled 2018-05-21 (×9): qty 2

## 2018-05-21 MED ORDER — TECHNETIUM TC 99M SESTAMIBI - CARDIOLITE
11.00 | Freq: Once | Status: AC | PRN
Start: 2018-05-21 — End: 2018-05-21
  Administered 2018-05-21: 11:00:00 11 via INTRAVENOUS

## 2018-05-21 NOTE — Progress Notes (Addendum)
Cardiology Progress Note       Date Time: 05/21/18 9:04 AM  Patient Name: Christopher Dickerson, Christopher SR.    LOS: 3 days      Cahnge  Stress test today  Life Vest arranged for  Change to entresto  NYHA class 3      Problem List:   Active Problems:    Atrial fibrillation with RVR    SOB (shortness of breath)    Noncompliance with medication regimen    Hypertension, unspecified type    Paroxysmal atrial fibrillation    Acute on chronic systolic congestive heart failure        Subjective   Subjective :   BP still too high  Creatinine 1.46  Heart rate 96  Complains of cough on lisinopril         Objective     Physical Exam:     Tmax: Temp (24hrs), Avg:85.5 F (29.7 C), Min:36.5 F (2.5 C), Max:98.1 F (36.7 C)     BP: (!) 150/96   Heart Rate: 95   SpO2: 97 %    Wt Readings from Last 3 Encounters:   05/21/18 89.9 kg (198 lb 3.1 oz)   03/04/18 90.6 kg (199 lb 11.8 oz)   07/04/17 90.5 kg (199 lb 8.3 oz)               Intake/Output Summary (Last 24 hours) at 05/21/2018 1610  Last data filed at 05/21/2018 0343  Gross per 24 hour   Intake 480 ml   Output 1000 ml   Net -520 ml         Constitutional -  Well appearing, and in no distress  Eyes - Pupils equal and reactive, extraocular eye movements intact, sclera anicteric  Respiratory - Clear to auscultation bilaterally, normal respiratory effort  Cardiovascular - ir ir rate and rhythm without murmurs rubs or gallops  Abdomen - soft, nontender, nondistended, no masses or organomegaly  Neurological - Alert, oriented, no focal neurological deficits  Extremities -  No edema  Skin - Warm and dry, no rashes, normal color         Medications:   apixaban, 5 mg, Oral, Q12H SCH  famotidine, 20 mg, Oral, BID  furosemide, 40 mg, Oral, Daily  hydrALAZINE, 50 mg, Oral, Q8H SCH  [START ON 05/22/2018] lisinopril, 5 mg, Oral, Daily  metoprolol succinate XL, 100 mg, Oral, BID  nicotine, 1 patch, Transdermal, Daily  sodium chloride (PF), 3 mL, Intravenous, Q8H  spironolactone, 25 mg, Oral,  Daily      Current Facility-Administered Medications   Medication         Labs:     Recent Labs   Lab 05/21/18  0247 05/20/18  1840 05/20/18  0231   Glucose 103* 159* 113*   BUN 23* 23* 23*   Creatinine 1.46* 1.42* 1.36*   Sodium 140 139 140   Potassium 4.3 3.8 3.9   Chloride 105 105 105   CO2 23 24.3 26     Recent Labs   Lab 05/20/18  0231 05/19/18  0333 05/18/18  1245   Magnesium 1.7  --   --    AST (SGOT)  --  24 30   ALT  --  54 62*   TSH  --   --  2.75         Recent Labs   Lab 05/21/18  0247 05/20/18  1840 05/20/18  0231   WBC 10.5 10.0 11.3*   Hemoglobin 15.3 14.6  14.6   Hematocrit 46.6 43.0 43.6   PLT CT 238 246 249     Recent Labs   Lab 05/18/18  1813 05/18/18  1532 05/18/18  1245   Troponin I 0.06* 0.05* 0.04*     Recent Labs   Lab 05/18/18  1245   B-Natriuretic Peptide 1,653.2*     No results found for: HGBA1CPERCNT   Recent Labs   Lab 05/18/18  1245   PT INR 1.1     Estimated Creatinine Clearance: 50.2 mL/min (A) (based on SCr of 1.46 mg/dL (H)).    Radiology tests last 24 hours:   No results found.           Signed by: Rudean Curt, MD,

## 2018-05-21 NOTE — Progress Notes (Signed)
Nuclear stress test; noted A-fib at rest. 0.4 mg Lexiscan IV given over 20 seconds.  No noted ST changes. Noted occasional PVCs. Patient exhibited a normal blood pressure response. Patient was asymptomatic.  Awaiting sestamibi images.    Chad Shawnta Schlegel, M.S., ACSM-CEP  [phone]

## 2018-05-21 NOTE — Progress Notes (Signed)
R radial site clean, dry, intact with bruising throughout the night. Pt wanting to keep armboard on until he gets up for the day for comfort. Armboard loose. Fingers warm and pink with full sensation. Will monitor.

## 2018-05-21 NOTE — Progress Notes (Addendum)
[facility]  PROGRESS NOTE      Patient: Christopher Suhr Sr.  Date: 05/21/2018   LOS: 3 Days  Admission Date: 05/18/2018   MRN: [id number]  Attending: Clabe Seal, MD       INTERIM SUMMARY           SUBJECTIVE     Christopher Fenton Sr. thinks breathing is improving.  Denies chest pain.    NAD otherwise      ASSESSMENT/PLAN     Christopher Arment Sr. is a 73 y.o. 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, related to cardiomyopathy     nonsustained V-tach   - cath performed, CAD in LAD, stress test for further evaluation  - life vest ordered and need for outpatient ICD, per cardiology  - LVEF 10-15% (02/2018)   - PO diuresis resumed today   - daily weights  - strict intake and output  - NPO for stress test, will resume salt/fluid restricted diet s/p stress test      CKD 3 --stable relatively.      # Atrial fibrillation, rate went up after stress test  - continue beta blocker ( resume after stress test)  - resume eliquis   -    # COPD  - continue PRN inhalers  - not acutely SOB     # Hypertension, needs better control   - pt was without medications for 1-2 weeks  - continue home medications, metoprolol and hydralazine  - add CLONIDINE po for now  -- Sherryll Burger will hopefully started soon   - will continue to monitor       # Tobacco Use  - educated upon cessation  - continue nicotine patch      # depression -- pt lost his wife recently and daughter thinks he is very depressed  -- daughter requested antidepressant     Disposition: home in couple days hopefully  DVT PPX:   eliquis   Code:            Full Code           Code Status: Full Code             MEDICATIONS     Current Facility-Administered Medications   Medication Dose Route Frequency   . apixaban  5 mg Oral Q12H SCH   . famotidine  20 mg Oral BID   . furosemide  40 mg Oral Daily   . hydrALAZINE  100 mg Oral Q8H SCH   . metoprolol succinate XL  100 mg Oral BID   . nicotine  1 patch Transdermal Daily   . sodium  chloride (PF)  3 mL Intravenous Q8H   . spironolactone  25 mg Oral Daily       PHYSICAL EXAM     Vitals:    05/21/18 1504   BP: (!) 157/115   Pulse: (!) 132   Resp:    Temp:    SpO2:        Temperature: Temp  Min: 36.5 F (2.5 C)  Max: 97.9 F (36.6 C)  Pulse: Pulse  Min: 60  Max: 132  Respiratory: Resp  Min: 16  Max: 19  Non-Invasive BP: BP  Min: 122/101  Max: 167/103  Pulse Oximetry SpO2  Min: 88 %  Max: 99 %      Constitutional: Seems to be NAD   HEENT: NC/AT, PERRL,EOMI. Neck supple  Cardiovascular: RRR, normal S1 S2, no  murmurs, gallops.  Respiratory: Very subtle crackles bibasilarly Normal rate. No retractions or increased work of breathing.  Gastrointestinal: Normal BS, non-distended, soft, non-tender, no rebound or guarding.  Genitourinary: no suprapubic or costovertebral angle tenderness  Musculoskeletal: ROM and motor strength grossly normal. No clubbing, edema.   Skin: no rashes, jaundice , cyanosis  Neurologic: AAO x3.  CN 2-12 grossly intact. No gross motor or sensory deficits  Psychiatric: Affect and mood appropriate. Cooperative.     LABS     Recent Labs   Lab 05/21/18  0247 05/20/18  1840 05/20/18  0231   WBC 10.5 10.0 11.3*   RBC 4.85 4.52 4.55   Hemoglobin 15.3 14.6 14.6   Hematocrit 46.6 43.0 43.6   MCV 96 95 96   PLT CT 238 246 249       Recent Labs   Lab 05/21/18  0247 05/20/18  1840 05/20/18  0231 05/19/18  0333 05/18/18  1245   Sodium 140 139 140 140 140   Potassium 4.3 3.8 3.9 4.7 4.9   Chloride 105 105 105 105 106   CO2 23 24.3 26 26 25    BUN 23* 23* 23* 18 15   Creatinine 1.46* 1.42* 1.36* 1.44* 1.18   Glucose 103* 159* 113* 119* 115*   Calcium 8.7 8.5 8.9 8.8 8.8   Magnesium  --   --  1.7  --   --        Recent Labs   Lab 05/19/18  0333 05/18/18  1245   ALT 54 62*   AST (SGOT) 24 30   Bilirubin, Total 1.1 1.0   Albumin 3.1* 3.3*   Alkaline Phosphatase 93 98       Recent Labs   Lab 05/18/18  1813 05/18/18  1532 05/18/18  1245   Troponin I 0.06* 0.05* 0.04*       Recent Labs   Lab  05/18/18  1245   PT INR 1.1   PT 11.1           Signed,  Clabe Seal, MD  3:08 PM 05/21/2018

## 2018-05-21 NOTE — Progress Notes (Addendum)
NURSE NOTE SUMMARY  [facility] - SURG TELEMETRY STEP-DOWN   Patient Name: Christopher Dickerson, Christopher SR.   Attending Physician: Clabe Seal, MD   Today's date:   05/21/2018 LOS: 3 days   Shift Summary:                                                              1610  Report rec'd, assumed care of pt. Pt resting quietly in bed, remains NPO for stress test. When informed about NPO status and stress test, pt became agitated stating "nobody told me about this. I didn't know. It's poor communication. All of it is poor communication". Educated pt on stress test & npo status. Pt verbalizes understanding. All needs addressed, cbir, will monitor.  0810 - AM dose of metoprolol held per Florentina Addison PA with [facility] d/t stress test  1500 - pt returned from stress test. Tele shows afib HR 130-140. Per Dr Lanny Hurst give metoprolol dose now.  1630 - pt's hr remains 120-140, Dr Lanny Hurst aware, MD states to notify cardiology. Dr Manson Passey notified of pt's HR  1705 - rec'd call from pt's daughter kelly requesting update & test results. Pt states okay to give information to daughter. Cortext sent to Dr Lanny Hurst requesting MD to update pt's family, MD states he will call daughter.   Provider Notifications:      Rapid Response Notifications:  Mobility:      PMP Activity: Step 4 - Dangle at Bedside (05/21/2018  8:19 AM)     Weight tracking:  Family Dynamic:   Last 3 Weights for the past 72 hrs (Last 3 readings):   Weight   05/21/18 0343 89.9 kg (198 lb 3.1 oz)   05/20/18 0430 92.2 kg (203 lb 4.2 oz)   05/19/18 0446 93.3 kg (205 lb 9.6 oz)          Health Care Agent: Christopher Dickerson - Daughter - [phone]     Recent Vitals Last Bowel Movement   BP: (!) 157/115 (05/21/2018  3:04 PM)  Heart Rate: (!) 132 (05/21/2018  3:04 PM)  Temp: 97.9 F (36.6 C) (05/21/2018  7:56 AM)  Resp Rate: 16 (05/21/2018  1:20 PM)  Weight: 89.9 kg (198 lb 3.1 oz) (05/21/2018  3:43 AM)  SpO2: 97 % (05/21/2018  7:56 AM)   Last BM Date: 05/20/18

## 2018-05-21 NOTE — Progress Notes (Addendum)
NURSE NOTE SUMMARY  [facility] - SURG TELEMETRY STEP-DOWN   Patient Name: Christopher Dickerson, Christopher SR.   Attending Physician: Clabe Seal, MD   Today's date:   05/21/2018 LOS: 3 days   Shift Summary:                                                              7:30 PM  Report received. Patient resting comfortably in bed. Denies needs at this time. Tele and IV intact. Life-vest in place. Call bell in reach. Bed alarm on with the bed in low position. Whiteboard up to date.      Provider Notifications:   1:41 AM  Dr. Barnet Pall, on call physician, notified of patient's continued elevated temp. Originally 102.6 rectally, Tylenol administered. Rectal temp rechecked one hour after administration, rectal temp is now 102. Per MD; okay to order one time dose of Motrin 400mg  PO.      Rapid Response Notifications:  Mobility:   12:06 AM  RRT notified that patient flagged for sepsis with positive SIRS screen and MEWS 5. Rectal temp 102.6, HR and BP elevated. See flowsheets. Patient given Tylenol and scheduled clonidine at this time. Patient has congested, non-productive cough. Stating that he does not feel well. RRT to round when available.    PMP Activity: Step 4 - Dangle at Bedside (05/21/2018  8:19 AM)     Weight tracking:  Family Dynamic:   Last 3 Weights for the past 72 hrs (Last 3 readings):   Weight   05/21/18 0343 89.9 kg (198 lb 3.1 oz)   05/20/18 0430 92.2 kg (203 lb 4.2 oz)   05/19/18 0446 93.3 kg (205 lb 9.6 oz)          Health Care Agent: Victorino December - Daughter - [phone]     Recent Vitals Last Bowel Movement   BP: (!) 153/104 (05/21/2018  4:00 PM)  Heart Rate: 98 (05/21/2018  7:00 PM)  Temp: 97.3 F (36.3 C) (05/21/2018  4:00 PM)  Resp Rate: 19 (05/21/2018  4:00 PM)  Weight: 89.9 kg (198 lb 3.1 oz) (05/21/2018  3:43 AM)  SpO2: 96 % (05/21/2018  4:00 PM)   Last BM Date: 05/20/18

## 2018-05-21 NOTE — Student Progress (Signed)
Medicine Progress Note   [facility]     Patient Name: Christopher Dickerson, Christopher SR. LOS: 3 days   Attending Physician: Clabe Seal, MD PCP: Gerilyn Pilgrim, MD      Hospital Course:                                                            [name] is a 73 y.o. male patient with past medical history of hypertension, COPD without oxygen, cardiomyopathy and atrial fibrillation presented to ED with worsening shortness of breath and lower extremity edema.  Pt states that he was unable to get his home medications refilled because he could not get into contact with his PCP.  BNP elevated and CXR demonstrated interstitial edema while in the ED.  Previous had cardiology follow up outpatient but patient did not proceed with plan for cardiac cath.  Pt admitted for IV diuresis and further evaluation.   05/20/2018: Early morning experience an episode of V-tach that spontaneous resolved.  Cardiac cath performed that revealed CAD in LAD, with further evaluation needed with stress test  05/21/2018: Per cardiology recommendation, life vest ordered for patient, will need outpatient placement of ICD.  Nuclear stress test performed, awaiting results.    Assessment and Plan:       # Acute on chronic congestive heart failure, related to cardiomyopathy complicated by runs of V-tach   - cath performed, CAD in LAD, stress test for further evaluation  - life vest ordered and need for outpatient ICD, per cardiology  - LVEF 10-15% (02/2018)   - PO diuresis resumed today   - daily weights  - strict intake and output  - NPO for stress test, will resume salt/fluid restricted diet s/p stress test     # Atrial fibrillation, controlled   - continue beta blocker  - continue eliquis   - HR < 100     # COPD  - continue PRN inhalers  - not acutely SOB     # Hypertension, uncontrolled  - pt was without medications for 1-2 weeks  - continue home medications, metoprolol, hydralazine and aldactone  - cardiology suggested  d/c of lisinopril due to cough and starting entresto   - will continue to monitor     # Tobacco Use  - educated upon cessation  - continue nicotine patch        Disposition: pending stress test results   DVT PPX:  eliquis   Code:  Full Code     Subjective   Pt c/o of being hungry this morning but denied shortness of breath or chest pain.         Objective   Physical Exam:     Vitals: T:97.9 F (36.6 C) (Oral), BP:(!) 153/101, HR:(!) 102, RR:16, SaO2:97%    General: Patient is awake. In no acute distress.  HEENT: No conjunctival drainage, vision is intact, anicteric sclera.  Neck: Supple, no thyromegaly.  Chest: CTA bilaterally. No rhonchi, no wheezing. No use of accessory muscles.  CVS: Normal rate and regular rhythm no murmurs, without JVD, no pitting edema, pulses palpable.  Cap refill < 3 seconds R/L hand.   Abdomen: Soft, non-tender, no guarding or rigidity, with normal bowel sounds.  Extremities: No calf swelling and no gross deformity.  Skin:  Warm, dry, no rash and no worrisome lesions. Cath insertion site to R radial wrist, bandage in place, dry and intact, with ecchymotic area.    NEURO: No motor or sensory deficits.  Psychiatric: Alert, interactive, appropriate, normal affect.  Weight Monitoring 03/03/2018 03/04/2018 05/18/2018 05/18/2018 05/19/2018 05/20/2018 05/21/2018   Height - - 182.9 cm - - - -   Height Method - - Stated - - - -   Weight 91.9 kg 90.6 kg 93.895 kg 93.8 kg 93.26 kg 92.2 kg 89.9 kg   Weight Method Standing Scale Standing Scale Estimated Standing Scale - Standing Scale Standing Scale   BMI (calculated) - - 28.1 kg/m2 - - - -         Intake/Output Summary (Last 24 hours) at 05/21/2018 1451  Last data filed at 05/21/2018 1230  Gross per 24 hour   Intake 480 ml   Output 1000 ml   Net -520 ml     Body mass index is 26.88 kg/m.     Meds:     Current Facility-Administered Medications   Medication Dose Route Frequency    apixaban  5 mg Oral Q12H SCH    famotidine  20 mg Oral BID    furosemide  40  mg Oral Daily    hydrALAZINE  100 mg Oral Q8H SCH    metoprolol succinate XL  100 mg Oral BID    nicotine  1 patch Transdermal Daily    sodium chloride (PF)  3 mL Intravenous Q8H    spironolactone  25 mg Oral Daily       PRN Meds: acetaminophen **OR** acetaminophen **OR** acetaminophen, albuterol, ALPRAZolam, dextromethorphan polistirex ER, guaiFENesin, naloxone, ondansetron **OR** ondansetron.     LABS:     Estimated Creatinine Clearance: 50.2 mL/min (A) (based on SCr of 1.46 mg/dL (H)).  Recent Labs   Lab 05/21/18  0247 05/20/18  1840   WBC 10.5 10.0   RBC 4.85 4.52   Hemoglobin 15.3 14.6   Hematocrit 46.6 43.0   MCV 96 95   PLT CT 238 246     Recent Labs   Lab 05/18/18  1245   PT 11.1   PT INR 1.1   aPTT 29.4     Recent Labs   Lab 05/18/18  1813 05/18/18  1532 05/18/18  1245   Troponin I 0.06* 0.05* 0.04*     No results found for: HGBA1CPERCNT  Recent Labs   Lab 05/21/18  0247 05/20/18  1840 05/20/18  0231   Glucose 103* 159* 113*   Sodium 140 139 140   Potassium 4.3 3.8 3.9   Chloride 105 105 105   CO2 23 24.3 26   BUN 23* 23* 23*   Creatinine 1.46* 1.42* 1.36*   EGFR 47* 49* 52*   Calcium 8.7 8.5 8.9     Recent Labs   Lab 05/20/18  0231 05/19/18  0333 05/18/18  1245   Magnesium 1.7  --   --    Albumin  --  3.1* 3.3*   Protein, Total  --  6.2 5.9*   Bilirubin, Total  --  1.1 1.0   Alkaline Phosphatase  --  93 98   ALT  --  54 62*   AST (SGOT)  --  24 30           Invalid input(s):  AMORPHOUSUA   Patient Lines/Drains/Airways Status    Active PICC Line / CVC Line / PIV Line / Drain / Airway / Intraosseous  Line / Epidural Line / ART Line / Line / Wound / Pressure Ulcer / NG/OG Tube     Name:   Placement date:   Placement time:   Site:   Days:    Peripheral IV 05/20/18 Left Forearm   05/20/18    1608    Forearm   less than 1    Venous Sheath 5 Fr. Right Brachial   05/20/18        Brachial   1    Puncture Site 05/20/18 Radial Anterior;Distal;Right;Lower Arm   05/20/18    1730     less than 1               Xr  Chest Ap Portable    Result Date: 05/18/2018  Cardiomegaly with trace interstitial edema and small left effusion. No focal consolidation or pneumothorax. ReadingStation:[id number]    NM Myocardial Perfusion Stress    Result Date: 05/21/2018  LV Function & Size:   Severely dilated left ventricle.   There is global reduction in left ventricular function.     CONCLUSIONS                                   - Risk Assessment: Intermediate   - Intermediate risk Lexiscan-sestamibi study demonstrating a partially reversible proximal to mid anterior wall perfusion defect.    - The defect demonstrates predominantly edge recovery and suggest scar with mild peri-infarct ischemia. There is additionally afixed inferior wall perfusion defect suggesting scar versus severe diaphragmatic attenuation. LV systolic function is severely globally reduced with a calculated ejection function of 22%. Summed difference score is 5.          Home Health Needs:  There are no questions and answers to display.       Nutrition assessment done in collaboration with Registered Dietitians:     Time spent:      [place]     05/21/18,2:51 PM   MRN: [id number]                                      CSN: [id number] DOB: [birth year]

## 2018-05-21 NOTE — Progress Notes (Addendum)
Quick Doc  [facility] - SURG TELEMETRY STEP-DOWN   Patient Name: Christopher Dickerson, Christopher SR.   Attending Physician: Clabe Seal, MD   Today's date:   05/21/2018 LOS: 3 days   Expected Discharge Date Expected Discharge Date: 05/21/18    Quick  Assessment:                                                              ReAdmit Risk Score: 15    CM Comments: 05/21/2018 RNCM--Afib with RVR. Telemetry monitoring. Needs lifevest. CM met with pt to discuss this and pt agreeable. CM has sent information to [facility]. Awaiting approval and fitting time. [employer] rep can be called to determine status [phone]. Pt on room air. Plan for [place] is home with son. Declines HH. Independent prior to admission. CM to follow and assist as needed.    Physical Discharge Disposition: Home                                   Mode of Transportation: Car                                      Physical Discharge Disposition: Home      Health Care Agent: Victorino December - Daughter - [phone]   Provider Notifications:      Calla Kicks, RN, BSN  Case Manager  [phone]  [phone]: fax

## 2018-05-21 NOTE — Progress Notes (Signed)
NM cardiac stress test completed. Lexiscan 0.4mg IV given over 20 seconds. + nausea after Lexiscan injection. Po caffeine given. Improved in recovery. Tolerated well.    Carle Fenech, RN, RRT  Nuclear Card Stress Lab RN  [phone]

## 2018-05-21 NOTE — Plan of Care (Signed)
Problem: Moderate/High Fall Risk Score >5  Goal: Patient will remain free of falls  Outcome: Progressing  Flowsheets (Taken 05/21/2018 2251)  VH Moderate Risk (6-13): ALL REQUIRED LOW INTERVENTIONS;INITIATE YELLOW "FALL RISK" SIGNAGE;YELLOW NON-SKID SLIPPERS;YELLOW "FALL RISK" ARM BAND;PLACE FALL RISK LEVEL ON WHITE BOARD FOR COMMUNICATION PURPOSES IN PATIENT'S ROOM  Note:   Patient is a moderate falls risk per protocol. Call bell in reach, patient educated about use.

## 2018-05-22 ENCOUNTER — Inpatient Hospital Stay: Payer: Medicare PPO

## 2018-05-22 LAB — CBC AND DIFFERENTIAL
Basophils %: 0.4 % (ref 0.0–3.0)
Basophils Absolute: 0 10*3/uL (ref 0.0–0.3)
Eosinophils %: 1.6 % (ref 0.0–7.0)
Eosinophils Absolute: 0.1 10*3/uL (ref 0.0–0.8)
Hematocrit: 47.5 % (ref 39.0–52.5)
Hemoglobin: 15.5 gm/dL (ref 13.0–17.5)
Lymphocytes Absolute: 0.4 10*3/uL — ABNORMAL LOW (ref 0.6–5.1)
Lymphocytes: 7.4 % — ABNORMAL LOW (ref 15.0–46.0)
MCH: 31 pg (ref 28–35)
MCHC: 33 gm/dL (ref 32–36)
MCV: 96 fL (ref 80–100)
MPV: 7.8 fL (ref 6.0–10.0)
Monocytes Absolute: 0.8 10*3/uL (ref 0.1–1.7)
Monocytes: 16 % — ABNORMAL HIGH (ref 3.0–15.0)
Neutrophils %: 74.7 % (ref 42.0–78.0)
Neutrophils Absolute: 3.6 10*3/uL (ref 1.7–8.6)
PLT CT: 205 10*3/uL (ref 130–440)
RBC: 4.96 10*6/uL (ref 4.00–5.70)
RDW: 13.8 % (ref 11.0–14.0)
WBC: 4.8 10*3/uL (ref 4.0–11.0)

## 2018-05-22 LAB — VH URINALYSIS WITH MICROSCOPIC AND CULTURE IF INDICATED
Bilirubin, UA: NEGATIVE
Blood, UA: NEGATIVE
Glucose, UA: NEGATIVE mg/dL
Ketones UA: NEGATIVE mg/dL
Leukocyte Esterase, UA: NEGATIVE Leu/uL
Nitrite, UA: NEGATIVE
Protein, UR: 100 mg/dL — AB
RBC, UA: 2 /hpf (ref 0–4)
Urine Specific Gravity: 1.015 (ref 1.001–1.040)
Urobilinogen, UA: 4 mg/dL — AB
WBC, UA: 1 /hpf (ref 0–4)
pH, Urine: 6 pH (ref 5.0–8.0)

## 2018-05-22 LAB — BASIC METABOLIC PANEL
Anion Gap: 12.6 mMol/L (ref 7.0–18.0)
BUN / Creatinine Ratio: 13.8 Ratio (ref 10.0–30.0)
BUN: 22 mg/dL (ref 7–22)
CO2: 25 mMol/L (ref 20–30)
Calcium: 8.4 mg/dL — ABNORMAL LOW (ref 8.5–10.5)
Chloride: 101 mMol/L (ref 98–110)
Creatinine: 1.59 mg/dL — ABNORMAL HIGH (ref 0.80–1.30)
EGFR: 43 mL/min/{1.73_m2} — ABNORMAL LOW (ref 60–150)
Glucose: 99 mg/dL (ref 71–99)
Osmolality Calculated: 273 mOsm/kg — ABNORMAL LOW (ref 275–300)
Potassium: 3.6 mMol/L (ref 3.5–5.3)
Sodium: 135 mMol/L — ABNORMAL LOW (ref 136–147)

## 2018-05-22 LAB — PROCALCITONIN: Procalcitonin: 0.1 ng/mL (ref 0.00–0.24)

## 2018-05-22 MED ORDER — IBUPROFEN 400 MG PO TABS
400.00 mg | ORAL_TABLET | Freq: Once | ORAL | Status: AC
Start: 2018-05-22 — End: 2018-05-22
  Administered 2018-05-22: 02:00:00 400 mg via ORAL
  Filled 2018-05-22 (×2): qty 1

## 2018-05-22 MED ORDER — FAMOTIDINE 20 MG PO TABS
20.0000 mg | ORAL_TABLET | Freq: Every day | ORAL | Status: DC
Start: 2018-05-23 — End: 2018-05-27
  Administered 2018-05-23 – 2018-05-27 (×5): 20 mg via ORAL
  Filled 2018-05-22 (×5): qty 1

## 2018-05-22 MED ORDER — CLONIDINE HCL 0.1 MG PO TABS
0.1000 mg | ORAL_TABLET | Freq: Two times a day (BID) | ORAL | Status: DC
Start: 2018-05-22 — End: 2018-05-23
  Administered 2018-05-22 – 2018-05-23 (×2): 0.1 mg via ORAL
  Filled 2018-05-22 (×3): qty 1

## 2018-05-22 NOTE — Student Progress (Signed)
Medicine Progress Note   [facility]     Patient Name: Christopher Dickerson, Christopher SR. LOS: 4 days   Attending Physician: Clabe Seal, MD PCP: Gerilyn Pilgrim, MD      Hospital Course:                                                            Christopher Fenton Sr. is a 73 y.o. male patient with past medical history of hypertension, COPD without oxygen, cardiomyopathy and atrial fibrillation presented to ED with worsening shortness of breath and lower extremity edema. Pt states that he was unable to get his home medications refilled because he could not get into contact with his PCP. BNP elevated and CXR demonstrated interstitial edema while in the ED. Previous had cardiology follow up outpatient but patient did not proceed with plan for cardiac cath. Pt admitted for IV diuresis and further evaluation.   05/20/2018: Early morning experienced an episode of V-tach that spontaneous resolved.  Cardiac cath performed that revealed CAD in LAD, with further evaluation needed with stress test  05/21/2018: Per cardiology recommendation, life vest ordered for patient, will need outpatient placement of ICD.  Nuclear stress test performed.  05/22/2018: Pt has life vest in place.  Pt spiked a fever throughout the night of 102.6 rectally, afebrile this morning. Denied new complaints.  CBC, blood cultures, UA and CXR ordered for further evaluation.      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   - UA: negative  - WBC wnl     # Chronic Kidney Disease: Stage 3   - stable, Cr increased from yesterday but near baseline for pt.   - will continue to monitor     # Atrial fibrillation, controlled   - continue beta blocker  - continue eliquis     # COPD  - continue PRN inhalers  - not acutely SOB     # Hypertension, controlled   - continue home medications, metoprolol and hydralazine  - clonidine 0.1mg  PO BID added   - BP's 110's/70's   - Entresto to be started tomorrow after washout period for lisinopril  - will continue to monitor     # Tobacco Use  - educated upon cessation  - continue nicotine patch    # Depression  - pt lost his wife recently  - daughter thinks he is very depressed    - daughter requested antidepressant   - pt started on mirtazapine    Disposition: Pending blood cultures and waiting for cardiology decision on cath   DVT PPX: heparin   Code:  Full Code  Subjective   Pt denies shortness of breath or chest pain.  Denies body aches or chills.  Pt in NAD.         Objective   Physical Exam:     Vitals: T:98.8 F (37.1 C) (Oral), BP:110/76, HR:91, RR:18, SaO2:97%    General: Patient is awake. In no acute distress.  HEENT: No conjunctival drainage, vision is intact, anicteric sclera.  Neck: Supple, no thyromegaly.  Chest: Subtle crackles bilateral lower lobes. No use of accessory muscles.  CVS: Normal rate and regular rhythm no murmurs, without JVD, no pitting edema, pulses palpable.  Abdomen: Soft, non-tender, no guarding or rigidity, with normal bowel sounds.  Extremities: No calf swelling and no gross deformity.  Skin: Warm, dry, no rash and no worrisome lesions.  Ecchymosis noted at R radial cath insertion site, no drainage or redness at site.   NEURO: No motor or sensory deficits.  Psychiatric: Alert and oriented x 3, interactive, appropriate, normal affect.  Weight Monitoring 03/04/2018 05/18/2018 05/18/2018 05/19/2018 05/20/2018 05/21/2018 05/22/2018    Height - 182.9 cm - - - - -   Height Method - Stated - - - - -   Weight 90.6 kg 93.895 kg 93.8 kg 93.26 kg 92.2 kg 89.9 kg 89.2 kg   Weight Method Standing Scale Estimated Standing Scale - Standing Scale Standing Scale Standing Scale   BMI (calculated) - 28.1 kg/m2 - - - - -         Intake/Output Summary (Last 24 hours) at 05/22/2018 1148  Last data filed at 05/22/2018 1002  Gross per 24 hour   Intake 1240 ml   Output    Net 1240 ml     Body mass index is 26.67 kg/m.     Meds:     Current Facility-Administered Medications   Medication Dose Route Frequency    apixaban  5 mg Oral Q12H SCH    cloNIDine  0.1 mg Oral Q12H SCH    famotidine  20 mg Oral BID    furosemide  40 mg Oral Daily    hydrALAZINE  100 mg Oral Q8H SCH    metoprolol succinate XL  100 mg Oral BID    mirtazapine  15 mg Oral QHS    nicotine  1 patch Transdermal Daily    sodium chloride (PF)  3 mL Intravenous Q8H    spironolactone  25 mg Oral Daily       PRN Meds: acetaminophen **OR** acetaminophen **OR** acetaminophen, albuterol, dextromethorphan polistirex ER, guaiFENesin, naloxone, ondansetron **OR** ondansetron.     LABS:     Estimated Creatinine Clearance: 46.1 mL/min (A) (based on SCr of 1.59 mg/dL (H)).  Recent Labs   Lab 05/22/18  1012 05/21/18  0247   WBC 4.8 10.5   RBC 4.96 4.85   Hemoglobin 15.5 15.3   Hematocrit 47.5 46.6   MCV 96 96   PLT CT 205 238     Recent Labs   Lab 05/18/18  1245   PT 11.1   PT INR 1.1   aPTT 29.4     Recent Labs   Lab 05/18/18  1813 05/18/18  1532 05/18/18  1245   Troponin I 0.06* 0.05* 0.04*     No results found for: HGBA1CPERCNT  Recent Labs   Lab 05/22/18  0324 05/21/18  0247 05/20/18  1840   Glucose 99 103* 159*   Sodium 135* 140 139   Potassium 3.6 4.3 3.8   Chloride 101 105 105   CO2  25 23 24.3   BUN 22 23* 23*   Creatinine 1.59* 1.46* 1.42*   EGFR 43* 47* 49*   Calcium 8.4* 8.7 8.5     Recent Labs   Lab 05/20/18  0231 05/19/18  0333 05/18/18  1245   Magnesium 1.7  --   --    Albumin  --  3.1* 3.3*    Protein, Total  --  6.2 5.9*   Bilirubin, Total  --  1.1 1.0   Alkaline Phosphatase  --  93 98   ALT  --  54 62*   AST (SGOT)  --  24 30           Invalid input(s):  AMORPHOUSUA   Patient Lines/Drains/Airways Status    Active PICC Line / CVC Line / PIV Line / Drain / Airway / Intraosseous Line / Epidural Line / ART Line / Line / Wound / Pressure Ulcer / NG/OG Tube     Name:   Placement date:   Placement time:   Site:   Days:    Peripheral IV 05/20/18 Left Forearm   05/20/18    1608    Forearm   1    Venous Sheath 5 Fr. Right Brachial   05/20/18        Brachial   2    Puncture Site 05/20/18 Radial Anterior;Distal;Right;Lower Arm   05/20/18    1730     1               Xr Chest Ap Portable    Result Date: 05/22/2018  No acute airspace consolidation. No pneumothorax or pleural effusion. Stable cardiomegaly. No acute osseous abnormality. Life vest evident which limits some assessment. ReadingStation:[email or web address]    Xr Chest Ap Portable    Result Date: 05/18/2018  Cardiomegaly with trace interstitial edema and small left effusion. No focal consolidation or pneumothorax. ReadingStation:[email or web address]     Home Health Needs:  There are no questions and answers to display.       Nutrition assessment done in collaboration with Registered Dietitians:     Time spent:      [place]     05/22/18,11:48 AM   MRN: [id number]                                      CSN: [id number] DOB: 19-Aug-1945

## 2018-05-22 NOTE — Progress Notes (Signed)
[facility]  PROGRESS NOTE      Patient: Christopher Hardenbrook Sr.  Date: 05/22/2018   LOS: 4 Days  Admission Date: 05/18/2018   MRN: [id number]  Attending: Clabe Seal, MD       INTERIM SUMMARY           SUBJECTIVE     Christopher Fenton Sr.     Denies chest pain.    Had fever overnight     NAD otherwise      ASSESSMENT/PLAN     Christopher Kloepfer Sr. is a 73 y.o. 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, related to cardiomyopathy     nonsustained V-tach   - cath performed, CAD in LAD, stress test for further evaluation  - life vest is arranged -- need for outpatient ICD, per cardiology  - LVEF 10-15% (02/2018)   - PO diuresis   - daily weights  - strict intake and output  -  stress test done and some abnormalities.[name] will allow time to cardiology to decide if anything needs to be done regards to these results    # Fever,-- maybe a viral syndrome   - procalcitonin 0.10, CXR seems no acute process in comparison to initial cxr, wbc count wnl    - did blood cultures  - UA: no UTI          CKD 3 --stable relatively.  Monitor cr    # Atrial fibrillation, rate look better today   - continue beta blocker  - resume eliquis   -    # COPD  - continue PRN inhalers  - not acutely SOB     # Hypertension, trends better now   - pt was without medications for 1-2 weeks  - continue home medications, metoprolol and hydralazine  - added CLONIDINE po   -- Sherryll Burger will hopefully started soon   - will continue to monitor       # Tobacco Use  - educated upon cessation  - continue nicotine patch      # depression -- pt lost his wife recently and daughter thinks he is very depressed  -- daughter requested antidepressant   -- remeron HS    Disposition: not ready to [place]    DVT PPX:   eliquis   Code:            Full Code           Code Status: Full Code             MEDICATIONS     Current Facility-Administered Medications   Medication Dose Route Frequency   . apixaban  5 mg Oral Q12H SCH   .  cloNIDine  0.1 mg Oral Q12H SCH   . famotidine  20 mg Oral BID   . furosemide  40 mg Oral Daily   . hydrALAZINE  100 mg Oral Q8H SCH   . metoprolol succinate XL  100 mg Oral BID   . mirtazapine  15 mg Oral QHS   . nicotine  1 patch Transdermal Daily   . sodium chloride (PF)  3 mL Intravenous Q8H   . spironolactone  25 mg Oral Daily       PHYSICAL EXAM     Vitals:    05/22/18 1516   BP: (!) 130/96   Pulse: 99   Resp: 16   Temp: 97.2 F (36.2 C)   SpO2: 95%  Temperature: Temp  Min: 97.2 F (36.2 C)  Max: 102.6 F (39.2 C)  Pulse: Pulse  Min: 57  Max: 121  Respiratory: Resp  Min: 16  Max: 19  Non-Invasive BP: BP  Min: 110/76  Max: 156/115  Pulse Oximetry SpO2  Min: 92 %  Max: 97 %      Constitutional: Seems to be NAD   HEENT: NC/AT, PERRL,EOMI. Neck supple  Cardiovascular: RRR, normal S1 S2, no murmurs, gallops.  Respiratory: Very subtle crackles bibasilarly Normal rate. No retractions or increased work of breathing.  Gastrointestinal: Normal BS, non-distended, soft, non-tender, no rebound or guarding.  Genitourinary: no suprapubic or costovertebral angle tenderness  Musculoskeletal: ROM and motor strength grossly normal. No clubbing, edema.   Skin: no rashes, jaundice , cyanosis  Neurologic: AAO x3.  CN 2-12 grossly intact. No gross motor or sensory deficits  Psychiatric: Affect and mood appropriate. Cooperative.     LABS     Recent Labs   Lab 05/22/18  1012 05/21/18  0247 05/20/18  1840   WBC 4.8 10.5 10.0   RBC 4.96 4.85 4.52   Hemoglobin 15.5 15.3 14.6   Hematocrit 47.5 46.6 43.0   MCV 96 96 95   PLT CT 205 238 246       Recent Labs   Lab 05/22/18  0324 05/21/18  0247 05/20/18  1840 05/20/18  0231 05/19/18  0333   Sodium 135* 140 139 140 140   Potassium 3.6 4.3 3.8 3.9 4.7   Chloride 101 105 105 105 105   CO2 25 23 24.3 26 26    BUN 22 23* 23* 23* 18   Creatinine 1.59* 1.46* 1.42* 1.36* 1.44*   Glucose 99 103* 159* 113* 119*   Calcium 8.4* 8.7 8.5 8.9 8.8   Magnesium  --   --   --  1.7  --        Recent Labs    Lab 05/19/18  0333 05/18/18  1245   ALT 54 62*   AST (SGOT) 24 30   Bilirubin, Total 1.1 1.0   Albumin 3.1* 3.3*   Alkaline Phosphatase 93 98       Recent Labs   Lab 05/18/18  1813 05/18/18  1532 05/18/18  1245   Troponin I 0.06* 0.05* 0.04*       Recent Labs   Lab 05/18/18  1245   PT INR 1.1   PT 11.1           Signed,  Clabe Seal, MD  3:16 PM 05/22/2018

## 2018-05-22 NOTE — Progress Notes (Signed)
Quick Doc  [facility] - SURG TELEMETRY STEP-DOWN   Patient Name: Christopher Dickerson, Christopher SR.   Attending Physician: Clabe Seal, MD   Today's date:   05/22/2018 LOS: 4 days   Expected Discharge Date Expected Discharge Date: 05/25/18    Quick  Assessment:                                                              ReAdmit Risk Score: 16    CM Comments: 01/24 RNCM/AS Afib w/ RVR.  Stress test, intermediate risk with partial reversible defect in prox to mid anterior region.  ?need for intervention.  [employer] Lifevest has been approved and pt has been fitted.  Plan to discharge home with support from family.  Declines need for [facility]  CM following    Physical Discharge Disposition: Home                                   Mode of Transportation: Car                                      Physical Discharge Disposition: Home      Health Care Agent: Christopher Dickerson - Daughter - [phone]   Provider Notifications:       Warren Danes RN, BSN  Nurse Case Manager  Phone:  [phone]  Fax:  [phone]

## 2018-05-22 NOTE — Progress Notes (Signed)
Cardiology Progress Note       Date Time: 05/22/18 9:21 AM  Patient Name: Christopher Dickerson, Christopher SR.    LOS: 4 days      Assessment / Plan     Cardiomyopathy  Plan: will present at cath conference next week  Culture up      Problem List:   Active Problems:    Atrial fibrillation with RVR    SOB (shortness of breath)    Noncompliance with medication regimen    Hypertension, unspecified type    Paroxysmal atrial fibrillation    Acute on chronic systolic congestive heart failure        Subjective   Subjective :   Now febrile to 102.6  BP good on hydralazine alone but creatinine up to 1.59  Nuclear results very confusing  Complains of cough         Objective     Physical Exam:     Tmax: Temp (24hrs), Avg:100.3 F (37.9 C), Min:97.3 F (36.3 C), Max:102.6 F (39.2 C)     BP: 111/78   Heart Rate: 98   SpO2: 97 %    Wt Readings from Last 3 Encounters:   05/22/18 89.2 kg (196 lb 10.4 oz)   03/04/18 90.6 kg (199 lb 11.8 oz)   07/04/17 90.5 kg (199 lb 8.3 oz)               Intake/Output Summary (Last 24 hours) at 05/22/2018 1610  Last data filed at 05/22/2018 9604  Gross per 24 hour   Intake 1000 ml   Output    Net 1000 ml         Constitutional -  Well appearing, and in no distress  Eyes - Pupils equal and reactive, extraocular eye movements intact, sclera anicteric  Respiratory - Clear to auscultation bilaterally, normal respiratory effort  Cardiovascular - ir ir without murmurs rubs or gallops  Abdomen - soft, nontender, nondistended, no masses or organomegaly  Neurological - Alert, oriented, no focal neurological deficits  Extremities -  No edema  Skin - Warm and dry, no rashes, normal color         Medications:   apixaban, 5 mg, Oral, Q12H SCH  cloNIDine, 0.1 mg, Oral, Q8H  famotidine, 20 mg, Oral, BID  furosemide, 40 mg, Oral, Daily  hydrALAZINE, 100 mg, Oral, Q8H SCH  metoprolol succinate XL, 100 mg, Oral, BID  mirtazapine, 15 mg, Oral, QHS  nicotine, 1 patch, Transdermal, Daily  sodium chloride (PF), 3 mL,  Intravenous, Q8H  spironolactone, 25 mg, Oral, Daily      Current Facility-Administered Medications   Medication         Labs:     Recent Labs   Lab 05/22/18  0324 05/21/18  0247 05/20/18  1840   Glucose 99 103* 159*   BUN 22 23* 23*   Creatinine 1.59* 1.46* 1.42*   Sodium 135* 140 139   Potassium 3.6 4.3 3.8   Chloride 101 105 105   CO2 25 23 24.3     Recent Labs   Lab 05/20/18  0231 05/19/18  0333 05/18/18  1245   Magnesium 1.7  --   --    AST (SGOT)  --  24 30   ALT  --  54 62*   TSH  --   --  2.75         Recent Labs   Lab 05/21/18  0247 05/20/18  1840 05/20/18  0231   WBC 10.5  10.0 11.3*   Hemoglobin 15.3 14.6 14.6   Hematocrit 46.6 43.0 43.6   PLT CT 238 246 249     Recent Labs   Lab 05/18/18  1813 05/18/18  1532 05/18/18  1245   Troponin I 0.06* 0.05* 0.04*     Recent Labs   Lab 05/18/18  1245   B-Natriuretic Peptide 1,653.2*     No results found for: HGBA1CPERCNT   Recent Labs   Lab 05/18/18  1245   PT INR 1.1     Estimated Creatinine Clearance: 46.1 mL/min (A) (based on SCr of 1.59 mg/dL (H)).    Radiology tests last 24 hours:   No results found.           Signed by: Rudean Curt, MD,

## 2018-05-22 NOTE — Progress Notes (Signed)
[facility] RAPID RESPONSE SEPSIS     Christopher Passage Sr. is a  73 y.o.  male admitted 05/18/2018 with   1. Acute congestive heart failure, unspecified heart failure type    2. SOB (shortness of breath)    3. Atrial fibrillation with rapid ventricular response    4. Noncompliance with medication regimen    5. Hypertension, unspecified type    6. Atrial fibrillation with RVR    7. Paroxysmal atrial fibrillation    8. Acute on chronic systolic congestive heart failure    9. SOB (shortness of breath)    10. Noncompliance with medication regimen    11. Hypertension, unspecified type    12. Paroxysmal atrial fibrillation    13. Acute on chronic systolic congestive heart failure      The Rapid Response Team was activated  on 05/22/2018 for sepsis screening. Vital signs are:   Temperature (!) 102 F (38.9 C) (Rectal), heart rate  99, blood pressure (!) 156/115, respirations 18, pulse ox 94%.         The Rapid Response Team was initiated to see your patient on 05/22/2018.  Based on the clinical situation, the Sepsis protocol was initiated.         Systemic Inflammatory Response Syndrome (select 2 or more to continue screening)   Hyperthermia (Temperature > 38.3C)  Yes   Hypothermia (Temperature < 36 C)  No   Pulse > 100   No   Respirations > 22  No   WBC > 12   or    WBC < 4    or   WBC wnl with > 10% bands  No      Rapid response evaluation triggered by computer generated Best Practice Alert for Sepsis screening. SIRS negative, sepsis panel not ordered per protocol. Staff to call with further RRT needs.    Christopher Blend, RN  Rapid Response Team

## 2018-05-22 NOTE — Progress Notes (Signed)
NURSE NOTE SUMMARY  [facility] - SURG TELEMETRY STEP-DOWN   Patient Name: Christopher Dickerson, Christopher Dickerson.   Attending Physician: Clabe Seal, MD   Today's date:   05/22/2018 LOS: 4 days   Shift Summary:                                                              0981 Pt assessed, up ad lib in room.  Steady gait.  Pt has life vest on.  Pt states he feels well this morning and really wants to go home.  No needs at this time.     Provider Notifications:      Rapid Response Notifications:  Mobility:      PMP Activity: Step 6 - Walks in Room (05/21/2018 10:51 PM)     Weight tracking:  Family Dynamic:   Last 3 Weights for the past 72 hrs (Last 3 readings):   Weight   05/22/18 0312 89.2 kg (196 lb 10.4 oz)   05/21/18 0343 89.9 kg (198 lb 3.1 oz)   05/20/18 0430 92.2 kg (203 lb 4.2 oz)          Health Care Agent: Victorino December - Daughter - [phone]     Recent Vitals Last Bowel Movement   BP: 111/78 (05/22/2018  8:07 AM)  Heart Rate: (!) 57 (05/22/2018  8:07 AM)  Temp: 98.8 F (37.1 C) (05/22/2018  8:07 AM)  Resp Rate: 18 (05/22/2018  8:07 AM)  Weight: 89.2 kg (196 lb 10.4 oz) (05/22/2018  3:12 AM)  SpO2: 97 % (05/22/2018  8:07 AM)   Last BM Date: 05/20/18

## 2018-05-23 ENCOUNTER — Inpatient Hospital Stay: Payer: Medicare PPO

## 2018-05-23 LAB — COMPREHENSIVE METABOLIC PANEL
ALT: 28 U/L (ref 0–55)
AST (SGOT): 22 U/L (ref 10–42)
Albumin/Globulin Ratio: 1.19 Ratio (ref 0.80–2.00)
Albumin: 3.2 gm/dL — ABNORMAL LOW (ref 3.5–5.0)
Alkaline Phosphatase: 80 U/L (ref 40–145)
Anion Gap: 13.6 mMol/L (ref 7.0–18.0)
BUN / Creatinine Ratio: 16 Ratio (ref 10.0–30.0)
BUN: 27 mg/dL — ABNORMAL HIGH (ref 7–22)
Bilirubin, Total: 0.8 mg/dL (ref 0.1–1.2)
CO2: 23 mMol/L (ref 20–30)
Calcium: 8.4 mg/dL — ABNORMAL LOW (ref 8.5–10.5)
Chloride: 100 mMol/L (ref 98–110)
Creatinine: 1.69 mg/dL — ABNORMAL HIGH (ref 0.80–1.30)
EGFR: 40 mL/min/{1.73_m2} — ABNORMAL LOW (ref 60–150)
Globulin: 2.7 gm/dL (ref 2.0–4.0)
Glucose: 96 mg/dL (ref 71–99)
Osmolality Calculated: 271 mOsm/kg — ABNORMAL LOW (ref 275–300)
Potassium: 3.6 mMol/L (ref 3.5–5.3)
Protein, Total: 5.9 gm/dL — ABNORMAL LOW (ref 6.0–8.3)
Sodium: 133 mMol/L — ABNORMAL LOW (ref 136–147)

## 2018-05-23 LAB — CBC AND DIFFERENTIAL
Basophils %: 0 % (ref 0.0–3.0)
Basophils Absolute: 0 10*3/uL (ref 0.0–0.3)
Eosinophils %: 0 % (ref 0.0–7.0)
Eosinophils Absolute: 0 10*3/uL (ref 0.0–0.8)
Hematocrit: 47.5 % (ref 39.0–52.5)
Hemoglobin: 15.6 gm/dL (ref 13.0–17.5)
Lymphocytes Absolute: 0.8 10*3/uL (ref 0.6–5.1)
Lymphocytes: 16 % (ref 15.0–46.0)
MCH: 31 pg (ref 28–35)
MCHC: 33 gm/dL (ref 32–36)
MCV: 95 fL (ref 80–100)
MPV: 8.4 fL (ref 6.0–10.0)
Monocytes Absolute: 0.7 10*3/uL (ref 0.1–1.7)
Monocytes: 14 % (ref 3.0–15.0)
Neutrophils %: 70 % (ref 42.0–78.0)
Neutrophils Absolute: 3.3 10*3/uL (ref 1.7–8.6)
PLT CT: 200 10*3/uL (ref 130–440)
RBC: 5.02 10*6/uL (ref 4.00–5.70)
RDW: 13.9 % (ref 11.0–14.0)
WBC: 4.7 10*3/uL (ref 4.0–11.0)

## 2018-05-23 LAB — CBC
Hematocrit: 46.8 % (ref 39.0–52.5)
Hemoglobin: 15.4 gm/dL (ref 13.0–17.5)
MCH: 31 pg (ref 28–35)
MCHC: 33 gm/dL (ref 32–36)
MCV: 94 fL (ref 80–100)
MPV: 8.3 fL (ref 6.0–10.0)
PLT CT: 212 10*3/uL (ref 130–440)
RBC: 4.96 10*6/uL (ref 4.00–5.70)
RDW: 13.8 % (ref 11.0–14.0)
WBC: 6.9 10*3/uL (ref 4.0–11.0)

## 2018-05-23 MED ORDER — VH MILRINONE IN DEXTROSE 20 MG/100 ML IV SOLN
0.2500 ug/kg/min | INTRAVENOUS | Status: DC
Start: 2018-05-23 — End: 2018-05-25
  Administered 2018-05-23 – 2018-05-25 (×6): 0.375 ug/kg/min via INTRAVENOUS
  Filled 2018-05-23 (×9): qty 100

## 2018-05-23 MED ORDER — FUROSEMIDE 10 MG/ML IJ SOLN
40.00 mg | Freq: Two times a day (BID) | INTRAMUSCULAR | Status: DC
Start: 2018-05-23 — End: 2018-05-24
  Administered 2018-05-24: 10:00:00 40 mg via INTRAVENOUS
  Filled 2018-05-23 (×3): qty 4

## 2018-05-23 NOTE — Progress Notes (Addendum)
NURSE NOTE SUMMARY  [facility] - SURG TELEMETRY STEP-DOWN   Patient Name: Christopher Dickerson, Christopher SR.   Attending Physician: Clabe Seal, MD   Today's date:   05/23/2018 LOS: 5 days   Shift Summary:                                                              1930: Report received from previous nurse. Afib on tele, temp remains elevated at 102, given tylenol and ginger ale. BP elevated at 171/98. Patient currently wearing life vest. Observed patient to have nonproductive cough but patient denies when asked about cough and states he does not need any medication for cough at this time. CBIR. Will continue to monitor.     2145: temp 100.1, continue to monitor. Scheduled hydralazine given.     2320: temp and BP continue to stabilize     0552: Temp 98.6 oral but patient experiencing chills    Provider Notifications:      Rapid Response Notifications:  Mobility:      PMP Activity: Step 6 - Walks in Room (05/23/2018  2:24 AM)     Weight tracking:  Family Dynamic:   Last 3 Weights for the past 72 hrs (Last 3 readings):   Weight   05/22/18 0312 89.2 kg (196 lb 10.4 oz)   05/21/18 0343 89.9 kg (198 lb 3.1 oz)   05/20/18 0430 92.2 kg (203 lb 4.2 oz)          Health Care Agent: Victorino December - Daughter - [phone]     Recent Vitals Last Bowel Movement   BP: 113/81 (05/22/2018 11:23 PM)  Heart Rate: (!) 58 (05/22/2018 11:23 PM)  Temp: 97.9 F (36.6 C) (05/22/2018 11:23 PM)  Resp Rate: 16 (05/22/2018 11:23 PM)  Weight: 89.2 kg (196 lb 10.4 oz) (05/22/2018  3:12 AM)  SpO2: 98 % (05/22/2018 11:23 PM)   Last BM Date: 05/20/18

## 2018-05-23 NOTE — Progress Notes (Signed)
[facility] Cardiology and Vascular Medicine - [facility]  Progress Note       Date Time: 05/23/18 10:34 AM  Patient Name: Christopher Dickerson, Christopher Dickerson.    LOS: 5 days      Assessment / Plan     Acute decompensation, chronic non-ischemic cardiomyopathy, LVEF 10-15%, volume overload, NYHA class III, chronic atrial fibrillationRVR   Had a very long detailed discussion with his daughter on the phone explained to her the situation at hand, he still has significant orthopnea, very poor insight I am not exactly certain about the degree of his recall/memory, he is somewhat suspicious of everything and unfortunately for a variety of reasons quite noncompliant both to seeking medical care, insight into his medical issues, medical noncompliance also.     Discussed CODE STATUS with him he wants his daughter to make the decision.   Given that he is still volume overloaded, still quite tachycardic in rapid atrial fibrillation and unfortunately still decompensated attempts at rate reduction will not be successful.  His last BNP was over thousand and his urine output is very hard to measure, his creatinine continues to climb upwards which is not surprising.   At present I would like to discontinue the hydralazine as it has no short-term benefit but agree with the long-term usage as tolerated although its 3 times a day dosage regimen may be too difficult for him to follow.   I would like to continue the beta-blocker at the present stage although I would like to have decreased it, I will initiate him on Primacor at a small dosage hopefully he will have no further runs of ventricular tachycardia and if he does I will initiate him on amiodarone.   His rapid atrial fibrillation is not only difficult to control but perhaps may very well be physiologically necessary given his market reduction in his LV systolic function of only 10% I am reluctant to reduce his heart rate to more "normal" levels for the fear of acute  decompensation.   I will transition his oral Lasix to IV Lasix for better absorption and urine output.   Place him on fluid restriction of 1.5 L a day.   From a cardiovascular standpoint he has a grim prognosis, I am not entirely certain that he understands or he wishes to even comply, had a very long and detailed discussion with the daughter who will try and see him on Monday, (she apparently cannot see him today or tomorrow because she herself is taking care of a 80-year-old child)   We will add digoxin to the regimen although I believe will add little to his overall impact/mortality.   I believe a robust discussion about palliative care/CODE STATUS needs to be had with him but probably more so with his daughter.  Fevers   Unclear etiology, doubtful that this is infectious in nature.   Wonder if it is an inflammatory response, will defer to the hospitalist team.    Problem List:   Active Problems:    Atrial fibrillation with RVR    SOB (shortness of breath)    Noncompliance with medication regimen    Hypertension, unspecified type    Paroxysmal atrial fibrillation    Acute on chronic systolic congestive heart failure        Subjective   Subjective :   Appears chronically ill.  Denies any complaints.         Objective     Physical Exam:     Tmax: Temp (24hrs),  Avg:99.1 F (37.3 C), Min:97.2 F (36.2 C), Max:102 F (38.9 C)     BP: 111/78   Heart Rate: 95   SpO2: 96 %    Wt Readings from Last 3 Encounters:   05/23/18 87.1 kg (192 lb 0.3 oz)   03/04/18 90.6 kg (199 lb 11.8 oz)   07/04/17 90.5 kg (199 lb 8.3 oz)               Intake/Output Summary (Last 24 hours) at 05/23/2018 1034  Last data filed at 05/22/2018 1230  Gross per 24 hour   Intake 0 ml   Output    Net 0 ml         Constitutional -  Well appearing, and in no distress  Eyes - Pupils equal and reactive, extraocular eye movements intact, sclera anicteric  Respiratory - Clear to auscultation bilaterally, normal respiratory effort  Cardiovascular  -summation gallop irregularly irregular  Abdomen - soft, nontender, nondistended, no masses or organomegaly  Neurological - Alert, oriented, no focal neurological deficits  Extremities -chronic vascular changes no lower extremity edema  Skin - Warm and dry, no rashes, normal color         Medications:   apixaban, 5 mg, Oral, Q12H SCH  famotidine, 20 mg, Oral, Daily  furosemide, 40 mg, Intravenous, BID  metoprolol succinate XL, 100 mg, Oral, BID  mirtazapine, 15 mg, Oral, QHS  nicotine, 1 patch, Transdermal, Daily  sodium chloride (PF), 3 mL, Intravenous, Q8H  spironolactone, 25 mg, Oral, Daily      Current Facility-Administered Medications   Medication    milrinone         Labs:     Recent Labs   Lab 05/23/18  0301 05/22/18  0324 05/21/18  0247   Glucose 96 99 103*   BUN 27* 22 23*   Creatinine 1.69* 1.59* 1.46*   Sodium 133* 135* 140   Potassium 3.6 3.6 4.3   Chloride 100 101 105   CO2 23 25 23      Recent Labs   Lab 05/23/18  0301 05/20/18  0231 05/19/18  0333 05/18/18  1245   Magnesium  --  1.7  --   --    AST (SGOT) 22  --  24 30   ALT 28  --  54 62*   TSH  --   --   --  2.75         Recent Labs   Lab 05/23/18  0301 05/22/18  1012 05/21/18  0247   WBC 4.7 4.8 10.5   Hemoglobin 15.6 15.5 15.3   Hematocrit 47.5 47.5 46.6   PLT CT 200 205 238     Recent Labs   Lab 05/18/18  1813 05/18/18  1532 05/18/18  1245   Troponin I 0.06* 0.05* 0.04*     Recent Labs   Lab 05/18/18  1245   B-Natriuretic Peptide 1,653.2*     No results found for: [id number]   Recent Labs   Lab 05/18/18  1245   PT INR 1.1     Estimated Creatinine Clearance: 43.4 mL/min (A) (based on SCr of 1.69 mg/dL (H)).    Radiology tests last 24 hours:   Xr Chest 2 Views    Result Date: 05/23/2018  Life vest evident which limits some assessment. No pneumothorax. Mild central vascular engorgement. No acute airspace consolidation or sizable pleural effusions. Stable cardiomegaly. No acute osseous abnormality. ReadingStation:[id number]  Signed by: Santo Held, MD,

## 2018-05-23 NOTE — Plan of Care (Signed)
Patient has safety awareness. Patient has been resting well in bed.

## 2018-05-23 NOTE — Progress Notes (Signed)
[facility]  PROGRESS NOTE      Patient: Christopher Mcquigg Sr.  Date: 05/23/2018   LOS: 5 Days  Admission Date: 05/18/2018   MRN: [id number]  Attending: Clabe Seal, MD       INTERIM SUMMARY           SUBJECTIVE     Christopher Fenton Sr.     Denies chest pain.    Fever trend better    NAD otherwise      ASSESSMENT/PLAN     Christopher Fenton Sr. is a 73 y.o. 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, related to cardiomyopathy     nonsustained V-tach   - cath performed, CAD in LAD, stress test for further evaluation  - life vest is arranged -- need for outpatient ICD, per cardiology  - LVEF 10-15% (02/2018)   - PO diuresis -- changed back to IV  - daily weights  - strict intake and output  -  stress test done and some abnormalities.[name] will allow time to cardiology to decide if anything needs to be done regards to these results  --> primacor drip started 1/25    # Fever,-- maybe a viral syndrome   - procalcitonin 0.10, CXR seems no acute process in comparison to initial cxr, wbc count wnl    - did blood cultures  - UA: no UTI  --> trends better with no abx           CKD 3 -- CR trending up slowly -- ( AKI territory now at top of ckd )  Somewhat expected   May be from CR syndrome   Monitor     # Atrial fibrillation, rate look better today   - continue beta blocker  - resume eliquis   -    # COPD  - continue PRN inhalers  - not acutely SOB     # Hypertension, trends better now   - pt was without medications for 1-2 weeks  - continue home medications, metoprolol   -- Entresto whan able    - will continue to monitor       # Tobacco Use  - educated upon cessation  - continue nicotine patch      # depression -- pt lost his wife recently and daughter thinks he is very depressed  -- daughter requested antidepressant   -- remeron HS    Disposition: not ready to [place]    DVT PPX:   eliquis   Code:            Full Code           Code Status: Full Code             MEDICATIONS      Current Facility-Administered Medications   Medication Dose Route Frequency   . apixaban  5 mg Oral Q12H SCH   . famotidine  20 mg Oral Daily   . furosemide  40 mg Intravenous BID   . metoprolol succinate XL  100 mg Oral BID   . mirtazapine  15 mg Oral QHS   . nicotine  1 patch Transdermal Daily   . sodium chloride (PF)  3 mL Intravenous Q8H   . spironolactone  25 mg Oral Daily       PHYSICAL EXAM     Vitals:    05/23/18 1201   BP: 101/63   Pulse: 85   Resp:    Temp:  SpO2:        Temperature: Temp  Min: 97.2 F (36.2 C)  Max: 102 F (38.9 C)  Pulse: Pulse  Min: 58  Max: 114  Respiratory: Resp  Min: 16  Max: 18  Non-Invasive BP: BP  Min: 101/63  Max: 171/98  Pulse Oximetry SpO2  Min: 92 %  Max: 98 %      Constitutional: Seems to be NAD   HEENT: NC/AT, PERRL,EOMI. Neck supple  Cardiovascular: RRR, normal S1 S2, no murmurs, gallops.  Respiratory: Very subtle crackles bibasilarly Normal rate. No retractions or increased work of breathing.  Gastrointestinal: Normal BS, non-distended, soft, non-tender, no rebound or guarding.  Genitourinary: no suprapubic or costovertebral angle tenderness  Musculoskeletal: ROM and motor strength grossly normal. No clubbing, edema.   Skin: no rashes, jaundice , cyanosis  Neurologic: AAO x3.  CN 2-12 grossly intact. No gross motor or sensory deficits  Psychiatric: Affect and mood appropriate. Cooperative.     LABS     Recent Labs   Lab 05/23/18  0301 05/22/18  1012 05/21/18  0247   WBC 4.7 4.8 10.5   RBC 5.02 4.96 4.85   Hemoglobin 15.6 15.5 15.3   Hematocrit 47.5 47.5 46.6   MCV 95 96 96   PLT CT 200 205 238       Recent Labs   Lab 05/23/18  0301 05/22/18  0324 05/21/18  0247 05/20/18  1840 05/20/18  0231   Sodium 133* 135* 140 139 140   Potassium 3.6 3.6 4.3 3.8 3.9   Chloride 100 101 105 105 105   CO2 23 25 23  24.3 26   BUN 27* 22 23* 23* 23*   Creatinine 1.69* 1.59* 1.46* 1.42* 1.36*   Glucose 96 99 103* 159* 113*   Calcium 8.4* 8.4* 8.7 8.5 8.9   Magnesium  --   --   --   --   1.7       Recent Labs   Lab 05/23/18  0301 05/19/18  0333 05/18/18  1245   ALT 28 54 62*   AST (SGOT) 22 24 30    Bilirubin, Total 0.8 1.1 1.0   Albumin 3.2* 3.1* 3.3*   Alkaline Phosphatase 80 93 98       Recent Labs   Lab 05/18/18  1813 05/18/18  1532 05/18/18  1245   Troponin I 0.06* 0.05* 0.04*       Recent Labs   Lab 05/18/18  1245   PT INR 1.1   PT 11.1           Signed,  Clabe Seal, MD  1:51 PM 05/23/2018

## 2018-05-24 LAB — B-TYPE NATRIURETIC PEPTIDE: B-Natriuretic Peptide: 103 pg/mL — ABNORMAL HIGH (ref 0.0–100.0)

## 2018-05-24 LAB — CBC
Hematocrit: 46.2 % (ref 39.0–52.5)
Hemoglobin: 15.3 gm/dL (ref 13.0–17.5)
MCH: 31 pg (ref 28–35)
MCHC: 33 gm/dL (ref 32–36)
MCV: 94 fL (ref 80–100)
MPV: 8.6 fL (ref 6.0–10.0)
PLT CT: 196 10*3/uL (ref 130–440)
RBC: 4.93 10*6/uL (ref 4.00–5.70)
RDW: 13.6 % (ref 11.0–14.0)
WBC: 5.8 10*3/uL (ref 4.0–11.0)

## 2018-05-24 LAB — BASIC METABOLIC PANEL
Anion Gap: 16.4 mMol/L (ref 7.0–18.0)
BUN / Creatinine Ratio: 18.6 Ratio (ref 10.0–30.0)
BUN: 41 mg/dL — ABNORMAL HIGH (ref 7–22)
CO2: 21 mMol/L (ref 20–30)
Calcium: 8.3 mg/dL — ABNORMAL LOW (ref 8.5–10.5)
Chloride: 101 mMol/L (ref 98–110)
Creatinine: 2.2 mg/dL — ABNORMAL HIGH (ref 0.80–1.30)
EGFR: 29 mL/min/{1.73_m2} — ABNORMAL LOW (ref 60–150)
Glucose: 100 mg/dL — ABNORMAL HIGH (ref 71–99)
Osmolality Calculated: 280 mOsm/kg (ref 275–300)
Potassium: 3.4 mMol/L — ABNORMAL LOW (ref 3.5–5.3)
Sodium: 135 mMol/L — ABNORMAL LOW (ref 136–147)

## 2018-05-24 MED ORDER — GUAIFENESIN ER 600 MG PO TB12
600.00 mg | ORAL_TABLET | Freq: Two times a day (BID) | ORAL | Status: DC
Start: 2018-05-24 — End: 2018-05-27
  Administered 2018-05-24 – 2018-05-27 (×7): 600 mg via ORAL
  Filled 2018-05-24 (×8): qty 1

## 2018-05-24 MED ORDER — FUROSEMIDE 10 MG/ML IJ SOLN
40.00 mg | Freq: Every day | INTRAMUSCULAR | Status: DC
Start: 2018-05-25 — End: 2018-05-25
  Administered 2018-05-25: 09:00:00 40 mg via INTRAVENOUS
  Filled 2018-05-24: qty 4

## 2018-05-24 MED ORDER — BENZONATATE 100 MG PO CAPS
100.00 mg | ORAL_CAPSULE | Freq: Two times a day (BID) | ORAL | Status: DC
Start: 2018-05-24 — End: 2018-05-27
  Administered 2018-05-24 – 2018-05-27 (×7): 100 mg via ORAL
  Filled 2018-05-24 (×8): qty 1

## 2018-05-24 NOTE — Progress Notes (Signed)
[facility] Cardiology and Vascular Medicine - [facility] [place]  Progress Note       Date Time: 05/24/18 11:29 AM  Patient Name: Christopher Dickerson, Christopher SR.    LOS: 6 days      Assessment / Plan     Severe cardiomyopathy (acute on chronic) LVEF 10-15%, NYHA class III, chronic atrial fibrillationRVR.   Seems to be feeling better this morning although cannot tell me why.   His creatinine is marginally elevated to be expected, will cut back on diuresis.   I would continue the milrinone at this point in time    his urine output has improved however I am not certain that we are accurately measuring it.   Continue attempts at neurohormonal therapy.    Problem List:   Active Problems:    Atrial fibrillation with RVR    SOB (shortness of breath)    Noncompliance with medication regimen    Hypertension, unspecified type    Paroxysmal atrial fibrillation    Acute on chronic systolic congestive heart failure        Subjective   Subjective :   Denies any complaints         Objective     Physical Exam:     Tmax: Temp (24hrs), Avg:98.1 F (36.7 C), Min:97.5 F (36.4 C), Max:99.3 F (37.4 C)     BP: 120/68   Heart Rate: 99   SpO2: 97 %    Wt Readings from Last 3 Encounters:   05/24/18 87.5 kg (192 lb 14.4 oz)   03/04/18 90.6 kg (199 lb 11.8 oz)   07/04/17 90.5 kg (199 lb 8.3 oz)               Intake/Output Summary (Last 24 hours) at 05/24/2018 1129  Last data filed at 05/24/2018 0900  Gross per 24 hour   Intake 600 ml   Output 400 ml   Net 200 ml         Constitutional -  Well appearing, and in no distress  Eyes - Pupils equal and reactive, extraocular eye movements intact, sclera anicteric  Respiratory - Clear to auscultation bilaterally, normal respiratory effort  Cardiovascular -S1-S2 irregularly irregular  Abdomen - soft, nontender, nondistended, no masses or organomegaly  Neurological - Alert, oriented, no focal neurological deficits  Extremities -no lower extremity edema  Skin - Warm and dry, no rashes,  normal color         Medications:   apixaban, 5 mg, Oral, Q12H SCH  benzonatate, 100 mg, Oral, BID  famotidine, 20 mg, Oral, Daily  [START ON 05/25/2018] furosemide, 40 mg, Intravenous, Daily  guaiFENesin, 600 mg, Oral, Q12H SCH  metoprolol succinate XL, 100 mg, Oral, BID  mirtazapine, 15 mg, Oral, QHS  nicotine, 1 patch, Transdermal, Daily  sodium chloride (PF), 3 mL, Intravenous, Q8H  spironolactone, 25 mg, Oral, Daily      Current Facility-Administered Medications   Medication    milrinone         Labs:     Recent Labs   Lab 05/24/18  0513 05/23/18  0301 05/22/18  0324   Glucose 100* 96 99   BUN 41* 27* 22   Creatinine 2.20* 1.69* 1.59*   Sodium 135* 133* 135*   Potassium 3.4* 3.6 3.6   Chloride 101 100 101   CO2 21 23 25      Recent Labs   Lab 05/23/18  0301 05/20/18  0231 05/19/18  0333 05/18/18  1245   Magnesium  --  1.7  --   --    AST (SGOT) 22  --  24 30   ALT 28  --  54 62*   TSH  --   --   --  2.75         Recent Labs   Lab 05/24/18  0513 05/23/18  1918 05/23/18  0301   WBC 5.8 6.9 4.7   Hemoglobin 15.3 15.4 15.6   Hematocrit 46.2 46.8 47.5   PLT CT 196 212 200     Recent Labs   Lab 05/18/18  1813 05/18/18  1532 05/18/18  1245   Troponin I 0.06* 0.05* 0.04*     Recent Labs   Lab 05/18/18  1245   B-Natriuretic Peptide 1,653.2*     No results found for: HGBA1CPERCNT   Recent Labs   Lab 05/18/18  1245   PT INR 1.1     Estimated Creatinine Clearance: 33.3 mL/min (A) (based on SCr of 2.2 mg/dL (H)).    Radiology tests last 24 hours:   No results found.           Signed by: Santo Held, MD,

## 2018-05-24 NOTE — Plan of Care (Signed)
Pt. Voiding often today. Bed alarm remains armed. No dyspnea or acute distress noted today. Primacor continued as ordered. No other changes noted in pt.

## 2018-05-24 NOTE — Progress Notes (Addendum)
NURSE NOTE SUMMARY  [facility] - SURG TELEMETRY STEP-DOWN   Patient Name: Christopher Dickerson, Christopher SR.   Attending Physician: Clabe Seal, MD   Today's date:   05/24/2018 LOS: 6 days   Shift Summary:                                                              0009: Report received from previous nurse, no significant changes, assessment complete. Afib on tele, VSS. Milrinone infusing 0.375 mcg/kg/min. No requests at this time. Will continue to monitor.     5409: Patient continues to have HR 90-110s, Afib/Aflutter w/ frequent PVC's.    Provider Notifications:      Rapid Response Notifications:  Mobility:      PMP Activity: Step 6 - Walks in Room (05/24/2018 12:05 AM)     Weight tracking:  Family Dynamic:   Last 3 Weights for the past 72 hrs (Last 3 readings):   Weight   05/23/18 0338 87.1 kg (192 lb 0.3 oz)   05/22/18 0312 89.2 kg (196 lb 10.4 oz)   05/21/18 0343 89.9 kg (198 lb 3.1 oz)          Health Care Agent: Victorino December - Daughter - [phone]     Recent Vitals Last Bowel Movement   BP: 105/55 (05/23/2018 10:53 PM)  Heart Rate: 68 (05/23/2018 10:53 PM)  Temp: 99.3 F (37.4 C) (05/23/2018 10:53 PM)  Resp Rate: 19 (05/23/2018 10:53 PM)  Weight: 87.1 kg (192 lb 0.3 oz) (05/23/2018  3:38 AM)  SpO2: 98 % (05/23/2018 10:53 PM)   Last BM Date: 05/22/18

## 2018-05-24 NOTE — Plan of Care (Signed)
Patient has remained majority of shift in bed. Currently wearing yellow slippers but becomes forgetful/confused at times.

## 2018-05-24 NOTE — Progress Notes (Signed)
[facility]  PROGRESS NOTE      Patient: Christopher Devan Sr.  Date: 05/24/2018   LOS: 6 Days  Admission Date: 05/18/2018   MRN: [id number]  Attending: Clabe Seal, MD       INTERIM SUMMARY   Christopher Bethel Sr. is a 73 y.o. male patient with past medical history of hypertension, COPD without oxygen, cardiomyopathy and atrial fibrillation presented to ED with worsening shortness of breath and lower extremity edema. Pt states that he was unable to get his home medications refilled because he could not get into contact with his PCP. BNP elevated and CXR demonstrated interstitial edema while in the ED. Previous had cardiology follow up outpatient but patient did not proceed with plan for cardiaccath. Pt admitted for IV diuresis and further evaluation.  05/20/2018: Early morning experienced an episode ofV-tach that spontaneous resolved. Cardiac cath performed that revealed CAD in LAD, with further evaluation needed with stress test  05/21/2018: Per cardiology recommendation, life vest ordered for patient, will need outpatient placement of ICD. Nuclear stress test performed.  05/22/2018: Pt has life vest in place.  Pt spiked a fever throughout the night of 102.6 rectally, afebrile this morning. Denied new complaints.  CBC, blood cultures, UA and CXR ordered for further evaluation.     Patient's infectious work-up so far not revealing.  Patient's fevers phased out.  Patient started on milrinone drip per cardiology.   Cardiology suggesting palliative care might be need to be involved because of poor prognosis.         SUBJECTIVE     Christopher Fenton Sr.     Denies chest pain.    Seems to be afebrile today    NAD otherwise      ASSESSMENT/PLAN     Christopher Gunn Sr. is a 73 y.o. 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, related to cardiomyopathy     nonsustained V-tach   - cath performed, CAD in LAD, stress test for further evaluation  - life vest is arranged  -- need for outpatient ICD, per cardiology  - LVEF 10-15% (02/2018)   - PO diuresis -- changed back to IV  - daily weights  - strict intake and output  -  stress test done and some abnormalities.[name] will allow time to cardiology to decide if anything needs to be done regards to these results  --> primacor drip started 1/25    # Fever,-- maybe a viral syndrome   - procalcitonin 0.10, CXR seems no acute process in comparison to initial cxr, wbc count wnl    - did blood cultures  - UA: no UTI  --> Fevers phased out with no antibiotics          CKD 3 -- CR trending up slowly   Somewhat expected   May be aki  from CR syndrome   Monitor     # Atrial fibrillation, with intermittent RVR .  --Cardiology suggesting intermittent RVR may be physiologicbecause of his/low EF   --Defer rate control to cardiology   - continue beta blocker  - resume eliquis   -    # COPD-- seem stable   -- needing cough med  - continue PRN inhalers  - not acutely SOB     # Hypertension, trends better now   - pt was without medications for 1-2 weeks  - continue home medications, metoprolol   -- Entresto when/if able    - will  continue to monitor       # Tobacco Use  - educated upon cessation  - continue nicotine patch      # depression -- pt lost his wife recently and daughter thinks he is very depressed  -- daughter requested antidepressant   -- remeron HS    Disposition: not ready to [place]. Cardiology suggesting palliative care might be need to be involved because of poor prognosis.     DVT PPX:   eliquis   Code:            Full Code           Code Status: Full Code             MEDICATIONS     Current Facility-Administered Medications   Medication Dose Route Frequency   . apixaban  5 mg Oral Q12H SCH   . benzonatate  100 mg Oral BID   . famotidine  20 mg Oral Daily   . [START ON 05/25/2018] furosemide  40 mg Intravenous Daily   . guaiFENesin  600 mg Oral Q12H SCH   . metoprolol succinate XL  100 mg Oral BID   . mirtazapine  15 mg Oral QHS   .  nicotine  1 patch Transdermal Daily   . sodium chloride (PF)  3 mL Intravenous Q8H   . spironolactone  25 mg Oral Daily       PHYSICAL EXAM     Vitals:    05/24/18 1543   BP: 135/88   Pulse: (!) 102   Resp: 17   Temp: 98.2 F (36.8 C)   SpO2: 95%       Temperature: Temp  Min: 97.5 F (36.4 C)  Max: 99.3 F (37.4 C)  Pulse: Pulse  Min: 52  Max: 102  Respiratory: Resp  Min: 16  Max: 19  Non-Invasive BP: BP  Min: 102/67  Max: 135/88  Pulse Oximetry SpO2  Min: 95 %  Max: 99 %      Constitutional: Seems to be NAD   HEENT: NC/AT, PERRL,EOMI. Neck supple  Cardiovascular: RRR, normal S1 S2, no murmurs, gallops.  Respiratory: Very subtle crackles bibasilarly Normal rate. No retractions or increased work of breathing.  Gastrointestinal: Normal BS, non-distended, soft, non-tender, no rebound or guarding.  Genitourinary: no suprapubic or costovertebral angle tenderness  Musculoskeletal: ROM and motor strength grossly normal. No clubbing, edema.   Skin: no rashes, jaundice , cyanosis  Neurologic: AAO x3.  CN 2-12 grossly intact. No gross motor or sensory deficits  Psychiatric: Affect and mood appropriate. Cooperative.     LABS     Recent Labs   Lab 05/24/18  0513 05/23/18  1918 05/23/18  0301   WBC 5.8 6.9 4.7   RBC 4.93 4.96 5.02   Hemoglobin 15.3 15.4 15.6   Hematocrit 46.2 46.8 47.5   MCV 94 94 95   PLT CT 196 212 200       Recent Labs   Lab 05/24/18  0513 05/23/18  0301 05/22/18  0324 05/21/18  0247 05/20/18  1840 05/20/18  0231   Sodium 135* 133* 135* 140 139 140   Potassium 3.4* 3.6 3.6 4.3 3.8 3.9   Chloride 101 100 101 105 105 105   CO2 21 23 25 23  24.3 26   BUN 41* 27* 22 23* 23* 23*   Creatinine 2.20* 1.69* 1.59* 1.46* 1.42* 1.36*   Glucose 100* 96 99 103* 159* 113*   Calcium 8.3* 8.4* 8.4*  8.7 8.5 8.9   Magnesium  --   --   --   --   --  1.7       Recent Labs   Lab 05/23/18  0301 05/19/18  0333 05/18/18  1245   ALT 28 54 62*   AST (SGOT) 22 24 30    Bilirubin, Total 0.8 1.1 1.0   Albumin 3.2* 3.1* 3.3*   Alkaline  Phosphatase 80 93 98       Recent Labs   Lab 05/18/18  1813 05/18/18  1532 05/18/18  1245   Troponin I 0.06* 0.05* 0.04*       Recent Labs   Lab 05/18/18  1245   PT INR 1.1   PT 11.1           Signed,  Clabe Seal, MD  6:19 PM 05/24/2018

## 2018-05-25 ENCOUNTER — Inpatient Hospital Stay: Payer: Medicare PPO

## 2018-05-25 LAB — BASIC METABOLIC PANEL
Anion Gap: 14.4 mMol/L (ref 7.0–18.0)
BUN / Creatinine Ratio: 17.3 Ratio (ref 10.0–30.0)
BUN: 33 mg/dL — ABNORMAL HIGH (ref 7–22)
CO2: 21 mMol/L (ref 20–30)
Calcium: 8.1 mg/dL — ABNORMAL LOW (ref 8.5–10.5)
Chloride: 102 mMol/L (ref 98–110)
Creatinine: 1.91 mg/dL — ABNORMAL HIGH (ref 0.80–1.30)
EGFR: 34 mL/min/{1.73_m2} — ABNORMAL LOW (ref 60–150)
Glucose: 116 mg/dL — ABNORMAL HIGH (ref 71–99)
Osmolality Calculated: 276 mOsm/kg (ref 275–300)
Potassium: 3.4 mMol/L — ABNORMAL LOW (ref 3.5–5.3)
Sodium: 134 mMol/L — ABNORMAL LOW (ref 136–147)

## 2018-05-25 LAB — CBC
Hematocrit: 46.2 % (ref 39.0–52.5)
Hemoglobin: 15.3 gm/dL (ref 13.0–17.5)
MCH: 31 pg (ref 28–35)
MCHC: 33 gm/dL (ref 32–36)
MCV: 93 fL (ref 80–100)
MPV: 8.7 fL (ref 6.0–10.0)
PLT CT: 181 10*3/uL (ref 130–440)
RBC: 4.96 10*6/uL (ref 4.00–5.70)
RDW: 13.5 % (ref 11.0–14.0)
WBC: 6.1 10*3/uL (ref 4.0–11.0)

## 2018-05-25 MED ORDER — VH POTASSIUM CHLORIDE CRYS ER 20 MEQ PO TBCR (WRAP)
40.00 meq | EXTENDED_RELEASE_TABLET | Freq: Once | ORAL | Status: AC
Start: 2018-05-25 — End: 2018-05-25
  Administered 2018-05-25: 10:00:00 40 meq via ORAL
  Filled 2018-05-25 (×2): qty 2

## 2018-05-25 MED ORDER — DEXTROSE 5 % IV EXCEL
0.50 mg/min | INTRAVENOUS | Status: DC
Start: 2018-05-25 — End: 2018-05-27
  Administered 2018-05-25: 14:00:00 1 mg/min via INTRAVENOUS
  Administered 2018-05-26: 15:00:00 0.5 mg/min via INTRAVENOUS
  Filled 2018-05-25 (×3): qty 18

## 2018-05-25 MED ORDER — METOPROLOL SUCCINATE ER 50 MG PO TB24
150.00 mg | ORAL_TABLET | Freq: Two times a day (BID) | ORAL | Status: DC
Start: 2018-05-25 — End: 2018-05-27
  Administered 2018-05-25 – 2018-05-27 (×4): 150 mg via ORAL
  Filled 2018-05-25 (×5): qty 3

## 2018-05-25 MED ORDER — FUROSEMIDE 10 MG/ML IJ SOLN
40.00 mg | Freq: Two times a day (BID) | INTRAMUSCULAR | Status: DC
Start: 2018-05-25 — End: 2018-05-26
  Administered 2018-05-25 – 2018-05-26 (×2): 40 mg via INTRAVENOUS
  Filled 2018-05-25 (×3): qty 4

## 2018-05-25 MED ORDER — VH SPIRONOLACTONE 25 MG PO TABS
12.5000 mg | ORAL_TABLET | Freq: Every day | ORAL | Status: DC
Start: 2018-05-26 — End: 2018-05-25

## 2018-05-25 NOTE — Progress Notes (Signed)
[facility]  PROGRESS NOTE      Patient: Christopher Callaham Sr.  Date: 05/25/2018   LOS: 7 Days  Admission Date: 05/18/2018   MRN: [id number]  Attending: Brantley Persons, MD       INTERIM SUMMARY   Christopher Fenton Sr. is a 73 y.o. male patient with past medical history of hypertension, COPD without oxygen, cardiomyopathy and atrial fibrillation presented to ED with worsening shortness of breath and lower extremity edema. Pt states that he was unable to get his home medications refilled because he could not get into contact with his PCP. BNP elevated and CXR demonstrated interstitial edema while in the ED. Previous had cardiology follow up outpatient but patient did not proceed with plan for cardiaccath. Pt admitted for IV diuresis and further evaluation.  05/20/2018: Early morning experienced an episode ofV-tach that spontaneous resolved. Cardiac cath performed that revealed CAD in LAD, with further evaluation needed with stress test  05/21/2018: Per cardiology recommendation, life vest ordered for patient, will need outpatient placement of ICD. Nuclear stress test performed.  05/22/2018: Pt has life vest in place.  Pt spiked a fever throughout the night of 102.6 rectally, afebrile this morning. Denied new complaints.  CBC, blood cultures, UA and CXR ordered for further evaluation.     Patient's infectious work-up so far not revealing.  Patient's fevers phased out.  Patient started on milrinone drip per cardiology.   Cardiology suggesting palliative care consult because of poor prognosis.     SUBJECTIVE     Christopher Fenton Sr.     Patient is resting comfortably, denies any chest pain, shortness of breath, nausea or vomiting.  Patient had low-grade fever last night no cough.  Patient is looking forward to be discharged home soon.      ASSESSMENT/PLAN     Christopher Finelli Sr. is a 73 y.o. 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     - life vest is arranged -- need for outpatient ICD, per cardiology  - LVEF 10-15% (02/2018)   - PO diuresis -- changed back to IV  - daily weights  - strict intake and output  Appreciate cardiology follow-up.  --> primacor drip started 1/25    # Fever,-- maybe a viral syndrome   - procalcitonin 0.10, CXR seems no acute process in comparison to initial cxr, wbc count wnl  Blood culture from January 24, no growth  - UA: no UTI  Chest x-ray is negative    CKD 3 -- CR trending down  Somewhat expected     # Atrial fibrillation, with intermittent RVR .  --Cardiology suggesting intermittent RVR may be physiologic because of his/low EF   --Defer rate control to cardiology   - continue beta blocker  - resume eliquis     # COPD-- seem stable   -- needing cough med  - continue PRN inhalers  - not acutely SOB     # Hypertension, well controlled  - pt was without medications for 1-2 weeks  - continue home medications, metoprolol   -- Entresto when/if able    - will continue to monitor       # Tobacco Use  - educated upon cessation  - continue nicotine patch      #  depression -- pt lost his wife recently and daughter thinks he is very depressed  -- daughter requested antidepressant   -- remeron HS    Disposition: Palliative care consult requested, patient likely be discharged ready soon once cleared by cardiology.  DVT PPX:   eliquis   Code:            Full Code           Code Status: Full Code             MEDICATIONS     Current Facility-Administered Medications   Medication Dose Route Frequency    apixaban  5 mg Oral Q12H SCH    benzonatate  100 mg Oral BID    famotidine  20 mg Oral Daily    furosemide  40 mg Intravenous Daily    guaiFENesin  600 mg Oral Q12H SCH    metoprolol succinate XL  100 mg Oral BID    mirtazapine  15 mg Oral QHS    nicotine  1 patch  Transdermal Daily    sodium chloride (PF)  3 mL Intravenous Q8H    spironolactone  25 mg Oral Daily       PHYSICAL EXAM     Vitals:    05/25/18 0757   BP: 111/73   Pulse: 63   Resp: 20   Temp: 99.1 F (37.3 C)   SpO2: 94%       Temperature: Temp  Min: 97.7 F (36.5 C)  Max: 100.6 F (38.1 C)  Pulse: Pulse  Min: 53  Max: 109  Respiratory: Resp  Min: 17  Max: 20  Non-Invasive BP: BP  Min: 111/73  Max: 142/90  Pulse Oximetry SpO2  Min: 94 %  Max: 97 %      Constitutional: Seems to be NAD   HEENT: NC/AT, PERRL,EOMI. Neck supple  Cardiovascular: RRR, normal S1 S2, no murmurs, gallops.  Respiratory: Very subtle crackles bibasilarly Normal rate. No retractions or increased work of breathing.  Gastrointestinal: Normal BS, non-distended, soft, non-tender, no rebound or guarding.  Genitourinary: no suprapubic or costovertebral angle tenderness  Musculoskeletal: ROM and motor strength grossly normal. No clubbing, edema.   Skin: no rashes, jaundice , cyanosis  Neurologic: AAO x3.  CN 2-12 grossly intact. No gross motor or sensory deficits  Psychiatric: Affect and mood appropriate. Cooperative.     LABS     Recent Labs   Lab 05/25/18  0356 05/24/18  0513 05/23/18  1918   WBC 6.1 5.8 6.9   RBC 4.96 4.93 4.96   Hemoglobin 15.3 15.3 15.4   Hematocrit 46.2 46.2 46.8   MCV 93 94 94   PLT CT 181 196 212       Recent Labs   Lab 05/25/18  0356 05/24/18  0513 05/23/18  0301 05/22/18  0324 05/21/18  0247  05/20/18  0231   Sodium 134* 135* 133* 135* 140  More results in Results Review 140   Potassium 3.4* 3.4* 3.6 3.6 4.3  More results in Results Review 3.9   Chloride 102 101 100 101 105  More results in Results Review 105   CO2 21 21 23 25 23   More results in Results Review 26   BUN 33* 41* 27* 22 23*  More results in Results Review 23*   Creatinine 1.91* 2.20* 1.69* 1.59* 1.46*  More results in Results Review 1.36*   Glucose 116* 100* 96 99 103*  More results in Results Review 113*  Calcium 8.1* 8.3* 8.4* 8.4* 8.7  More results in  Results Review 8.9   Magnesium  --   --   --   --   --   --  1.7   More results in Results Review = values in this interval not displayed.       Recent Labs   Lab 05/23/18  0301 05/19/18  0333 05/18/18  1245   ALT 28 54 62*   AST (SGOT) 22 24 30    Bilirubin, Total 0.8 1.1 1.0   Albumin 3.2* 3.1* 3.3*   Alkaline Phosphatase 80 93 98       Recent Labs   Lab 05/18/18  1813 05/18/18  1532 05/18/18  1245   Troponin I 0.06* 0.05* 0.04*       Recent Labs   Lab 05/18/18  1245   PT INR 1.1   PT 11.1           Signed,  Brantley Persons, MD  8:36 AM 05/25/2018

## 2018-05-25 NOTE — Plan of Care (Signed)
CBIR, bed alarm on, non slip socks on

## 2018-05-25 NOTE — Plan of Care (Addendum)
NURSE NOTE SUMMARY  [facility] - SURG TELEMETRY STEP-DOWN   Patient Name: Christopher Dickerson, Christopher SR.   Attending Physician: Brantley Persons, MD   Today's date:   05/25/2018 LOS: 7 days   Shift Summary:                                                              0981-1914: Pt received lying in bed, requested cough medication same requested from pharmacy, nonskid socks on, call bell and bedside table within reach, will monitor   Provider Notifications:      Rapid Response Notifications:  Mobility:      PMP Activity: Step 7 - Walks out of Room (05/25/2018  3:09 PM)     Weight tracking:  Family Dynamic:   Last 3 Weights for the past 72 hrs (Last 3 readings):   Weight   05/25/18 0218 87.2 kg (192 lb 3.9 oz)   05/24/18 0350 87.5 kg (192 lb 14.4 oz)   05/23/18 0338 87.1 kg (192 lb 0.3 oz)          Health Care Agent: Victorino December - Daughter - [phone]     Recent Vitals Last Bowel Movement   BP: (!) 147/104 (05/25/2018  7:37 PM)  Heart Rate: (!) 59 (05/25/2018  7:37 PM)  Temp: 99.7 F (37.6 C) (05/25/2018  7:37 PM)  Resp Rate: 20 (05/25/2018  7:37 PM)  Weight: 87.2 kg (192 lb 3.9 oz) (05/25/2018  2:18 AM)  SpO2: 97 % (05/25/2018  7:37 PM)   Last BM Date: 05/23/18       Problem: Hemodynamic Status: Cardiac  Goal: Stable vital signs and fluid balance  Outcome: Progressing  Flowsheets (Taken 05/25/2018 2025)  Stable vital signs and fluid balance: Monitor/assess vital signs and telemetry per unit protocol; Weigh on admission and record weight daily; Assess signs and symptoms associated with cardiac rhythm changes; Monitor intake/output per unit protocol and/or LIP order; Monitor for leg swelling/edema and report to LIP if abnormal; Monitor lab values

## 2018-05-25 NOTE — Progress Notes (Addendum)
Cardiology Progress Note      Date Time: 05/25/18 1:28 PM  Patient Name: Christopher Dickerson, Christopher SR.      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  50% in the diagonal branch.  30% in the RCA.  20% in the PDA   Mild tricuspid regurgitation.    Tele atrial fibrillation with heart rate in the 100s     Plan:   I suspect that the cardiomyopathy is at least partly due to the tachycardia and atrial fibrillation.  I think controlling the heart rate should be a priority in this patient    1. Discontinue milrinone  2. Increase Lasix to 40 twice a day  3. Will stop Aldactone for the time being in a.m. to increase the beta-blocker  4. Increase Toprol to 150 twice daily  5. Will start amnio drip-I will give him a second chance of trying to maintain sinus rhythm  6. Will add Plavix 75 mg  7. We will add Lipitor 40 mg  8. I discussed with the patient the importance of following as an outpatient-he has not followed up as an outpatient since his last hospitalization  9. I  believe that he might be ready for discharge over the next 24-48 hours      Subjective:   The patient mentions he feels better.  The patient denies chest pain, worsening DIB (difficulty in breathing), palpitations or dizziness.       Physical Exam:     Temp:  [97.9 F (36.6 C)-100.6 F (38.1 C)] 97.9 F (36.6 C)  Heart Rate:  [53-109] 64  Resp Rate:  [17-20] 20  BP: (111-142)/(69-90) 115/69    Wt Readings from Last 3 Encounters:   05/25/18 87.2 kg (192 lb 3.9 oz)   03/04/18 90.6 kg (199 lb 11.8 oz)   07/04/17 90.5 kg (199 lb 8.3 oz)            Intake/Output Summary (Last 24 hours) at 05/25/2018 1328  Last data filed at 05/25/2018 1206  Gross per 24 hour   Intake 675.93 ml   Output 650 ml   Net 25.93 ml       General appearance - alert, well appearing, and in no distress  Chest -mild crackles at the  bases  Heart -tachycardic and irregular  Extremities - peripheral pulses normal, no pedal edema, no clubbing or cyanosis  Abd: soft. Non-tender, non-distended,  Neuro: alert and oriented  Skin: no rashes  Psych: normal affect    Medications:     Scheduled Meds: apixaban, 5 mg, Oral, Q12H SCH  benzonatate, 100 mg, Oral, BID  famotidine, 20 mg, Oral, Daily  furosemide, 40 mg, Intravenous, BID  guaiFENesin, 600 mg, Oral, Q12H SCH  metoprolol succinate XL, 150 mg, Oral, BID  mirtazapine, 15 mg, Oral, QHS  nicotine, 1 patch, Transdermal, Daily  sodium chloride (PF), 3 mL, Intravenous, Q8H        . amiodarone           Labs:     Recent Labs   Lab 05/25/18  0356 05/24/18  0513 05/23/18  0301  05/20/18  0231   Glucose 116* 100* 96  More results in Results Review 113*   BUN 33* 41* 27*  More results in Results Review 23*   Creatinine 1.91* 2.20* 1.69*  More results in Results Review 1.36*  Sodium 134* 135* 133*  More results in Results Review 140   Potassium 3.4* 3.4* 3.6  More results in Results Review 3.9   Chloride 102 101 100  More results in Results Review 105   CO2 21 21 23   More results in Results Review 26   Magnesium  --   --   --   --  1.7   AST (SGOT)  --   --  22  --   --    ALT  --   --  28  --   --    More results in Results Review = values in this interval not displayed.     Estimated Creatinine Clearance: 38.4 mL/min (A) (based on SCr of 1.91 mg/dL (H)).    No results for input(s): CHOL, TRIG, HDL, LDL in the last 8760 hours.    Recent Labs   Lab 05/25/18  0356 05/24/18  0513 05/23/18  1918   WBC 6.1 5.8 6.9   Hemoglobin 15.3 15.3 15.4   Hematocrit 46.2 46.2 46.8   PLT CT 181 196 212       Recent Labs   Lab 05/18/18  1245 05/18/18  1532 05/18/18  1813   TROPI 0.04* 0.05* 0.06*       Recent Labs   Lab 05/18/18  1245   TSH 2.75         Electronically signed by:  Harlin Rain, MD   [facility] cardiac and vascular medicine

## 2018-05-25 NOTE — Progress Notes (Addendum)
NURSE NOTE SUMMARY  [facility] - SURG TELEMETRY STEP-DOWN   Patient Name: Christopher Dickerson, Christopher SR.   Attending Physician: Clabe Seal, MD   Today's date:   05/25/2018 LOS: 7 days   Shift Summary:                                                              0705: report received, pt has US renal artery duplex ordered, Pt made NPO for possible study today.    0800: spoke with pt daughter she would like to talk with MD and CM for d/c. Updated both    1515: pt daughter called again, followed up with CM and MD to call daughter again.   Provider Notifications:      Rapid Response Notifications:  Mobility:      PMP Activity: Step 6 - Walks in Room (05/25/2018 12:53 AM)     Weight tracking:  Family Dynamic:   Last 3 Weights for the past 72 hrs (Last 3 readings):   Weight   05/25/18 0218 87.2 kg (192 lb 3.9 oz)   05/24/18 0350 87.5 kg (192 lb 14.4 oz)   05/23/18 0338 87.1 kg (192 lb 0.3 oz)          Health Care Agent: Victorino December - Daughter - [phone]     Recent Vitals Last Bowel Movement   BP: 128/81 (05/25/2018  2:18 AM)  Heart Rate: (!) 107 (05/25/2018  2:18 AM)  Temp: 97.9 F (36.6 C) (05/25/2018  5:00 AM)  Resp Rate: 17 (05/25/2018  2:18 AM)  Weight: 87.2 kg (192 lb 3.9 oz) (05/25/2018  2:18 AM)  SpO2: 94 % (05/25/2018  2:18 AM)   Last BM Date: 05/23/18

## 2018-05-26 LAB — BASIC METABOLIC PANEL
Anion Gap: 13.9 mMol/L (ref 7.0–18.0)
BUN / Creatinine Ratio: 17.1 Ratio (ref 10.0–30.0)
BUN: 30 mg/dL — ABNORMAL HIGH (ref 7–22)
CO2: 23.9 mMol/L (ref 20.0–30.0)
Calcium: 8.7 mg/dL (ref 8.5–10.5)
Chloride: 103 mMol/L (ref 98–110)
Creatinine: 1.75 mg/dL — ABNORMAL HIGH (ref 0.80–1.30)
EGFR: 38 mL/min/{1.73_m2} — ABNORMAL LOW (ref 60–150)
Glucose: 114 mg/dL — ABNORMAL HIGH (ref 71–99)
Osmolality Calculated: 281 mOsm/kg (ref 275–300)
Potassium: 3.8 mMol/L (ref 3.5–5.3)
Sodium: 137 mMol/L (ref 136–147)

## 2018-05-26 LAB — CBC
Hematocrit: 50.4 % (ref 39.0–52.5)
Hemoglobin: 16.8 gm/dL (ref 13.0–17.5)
MCH: 31 pg (ref 28–35)
MCHC: 33 gm/dL (ref 32–36)
MCV: 94 fL (ref 80–100)
MPV: 8.7 fL (ref 6.0–10.0)
PLT CT: 186 10*3/uL (ref 130–440)
RBC: 5.37 10*6/uL (ref 4.00–5.70)
RDW: 13.7 % (ref 11.0–14.0)
WBC: 6.7 10*3/uL (ref 4.0–11.0)

## 2018-05-26 MED ORDER — VH BIO-K PLUS PROBIOTIC 50 BIL CFU CAPSULE
50.00 | DELAYED_RELEASE_CAPSULE | Freq: Every day | ORAL | Status: DC
Start: 2018-05-26 — End: 2018-05-27
  Administered 2018-05-26 – 2018-05-27 (×2): 50 via ORAL
  Filled 2018-05-26 (×2): qty 1

## 2018-05-26 MED ORDER — RIVAROXABAN 15 MG PO TABS
15.0000 mg | ORAL_TABLET | Freq: Every day | ORAL | Status: DC
Start: 2018-05-26 — End: 2018-05-27
  Administered 2018-05-26 – 2018-05-27 (×2): 15 mg via ORAL
  Filled 2018-05-26 (×2): qty 1

## 2018-05-26 MED ORDER — LEVOFLOXACIN 750 MG PO TABS
750.0000 mg | ORAL_TABLET | ORAL | Status: DC
Start: 2018-05-26 — End: 2018-05-27
  Administered 2018-05-26: 13:00:00 750 mg via ORAL
  Filled 2018-05-26: qty 1

## 2018-05-26 MED ORDER — FUROSEMIDE 40 MG PO TABS
40.0000 mg | ORAL_TABLET | Freq: Every day | ORAL | Status: DC
Start: 2018-05-27 — End: 2018-05-27
  Administered 2018-05-27: 12:00:00 40 mg via ORAL
  Filled 2018-05-26: qty 1

## 2018-05-26 NOTE — Plan of Care (Addendum)
NURSE NOTE SUMMARY  [facility] - SURG TELEMETRY STEP-DOWN   Patient Name: ABDULKADIR, Christopher SR.   Attending Physician: Brantley Persons, MD   Today's date:   05/26/2018 LOS: 8 days   Shift Summary:                                                              1610-9604: Pt received lying in bed no complain voiced, nonskid socks on table, bed alarm on, call bell and bedside table within reach, will monitor   Provider Notifications:      Rapid Response Notifications:  Mobility:      PMP Activity: Step 6 - Walks in Room (05/26/2018 12:00 PM)     Weight tracking:  Family Dynamic:   Last 3 Weights for the past 72 hrs (Last 3 readings):   Weight   05/26/18 0603 85.6 kg (188 lb 11.4 oz)   05/25/18 0218 87.2 kg (192 lb 3.9 oz)   05/24/18 0350 87.5 kg (192 lb 14.4 oz)          Health Care Agent: Christopher Dickerson - Daughter - [phone]     Recent Vitals Last Bowel Movement   BP: (!) 140/97 (05/26/2018  9:31 PM)  Heart Rate: 85 (05/26/2018  9:33 PM)  Temp: 98.1 F (36.7 C) (05/26/2018  9:31 PM)  Resp Rate: 17 (05/26/2018  9:31 PM)  Weight: 85.6 kg (188 lb 11.4 oz) (05/26/2018  6:03 AM)  SpO2: 99 % (05/26/2018  9:31 PM)   Last BM Date: 05/26/18       Problem: Hemodynamic Status: Cardiac  Goal: Stable vital signs and fluid balance  Outcome: Progressing  Flowsheets (Taken 05/25/2018 2025)  Stable vital signs and fluid balance: Monitor/assess vital signs and telemetry per unit protocol;Weigh on admission and record weight daily;Assess signs and symptoms associated with cardiac rhythm changes;Monitor intake/output per unit protocol and/or LIP order;Monitor for leg swelling/edema and report to LIP if abnormal;Monitor lab values

## 2018-05-26 NOTE — Progress Notes (Signed)
Quick Doc  [facility] - SURG TELEMETRY STEP-DOWN   Patient Name: Christopher Dickerson, Christopher SR.   Attending Physician: Brantley Persons, MD   Today's date:   05/26/2018 LOS: 8 days   Expected Discharge Date Expected Discharge Date: 05/27/18    Quick  Assessment:                                                              ReAdmit Risk Score: 17    CM Comments: 01/28 RNCM (AS) Afib w/ RVR.   Stress test, intermediate risk with partial reversible defect in prox to mid anterior region.  Milrinone gtt has been d/c'd, amio gtt to transition to oral.  Zoll lifevest as been approved and pt has been fitted.  Palliative consult pending.  RNCM spoke with pts dtr Tresa Endo at length today.  Plan to discharge home with support from family.  Declines HH.  FAmily will provide discharge transportation. CM will continue to follow    Physical Discharge Disposition: Home                                   Mode of Transportation: Car                                      Physical Discharge Disposition: Home      Health Care Agent: Victorino December - Daughter - [phone]   Provider Notifications:       Warren Danes RN, BSN  Nurse Case Manager  Phone:  [phone]  Fax:  [phone]

## 2018-05-26 NOTE — Progress Notes (Signed)
[facility]  PROGRESS NOTE      Patient: Christopher Robinette Sr.  Date: 05/26/2018   LOS: 8 Days  Admission Date: 05/18/2018   MRN: [id number]  Attending: Brantley Persons, MD       INTERIM SUMMARY   Christopher Fenton Sr. is a 73 y.o. male patient with past medical history of hypertension, COPD without oxygen, cardiomyopathy and atrial fibrillation presented to ED with worsening shortness of breath and lower extremity edema. Pt states that he was unable to get his home medications refilled because he could not get into contact with his PCP. BNP elevated and CXR demonstrated interstitial edema while in the ED. Previous had cardiology follow up outpatient but patient did not proceed with plan for cardiaccath. Pt admitted for IV diuresis and further evaluation.  05/20/2018: Early morning experienced an episode ofV-tach that spontaneous resolved. Cardiac cath performed that revealed CAD in LAD, with further evaluation needed with stress test  05/21/2018: Per cardiology recommendation, life vest ordered for patient, will need outpatient placement of ICD. Nuclear stress test performed.  05/22/2018: Pt has life vest in place.  Pt spiked a fever throughout the night of 102.6 rectally, afebrile this morning. Denied new complaints.  CBC, blood cultures, UA and CXR ordered for further evaluation.     Patient's infectious work-up so far not revealing.  Patient's fevers phased out.  Patient started on milrinone drip per cardiology.   Cardiology suggesting palliative care consult because of poor prognosis.     SUBJECTIVE     Christopher Fenton Sr.     Patient is resting comfortably, denies any chest pain, shortness of breath, nausea or vomiting.  Patient had low-grade fever last night no cough.  Patient is looking forward to be discharged home soon.  Patient noted to have intermittent dry cough which is going on for 2- 3 weeks.  Patient also has low-grade fever.      ASSESSMENT/PLAN     Christopher Santillo Sr. is a 73 y.o. 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     - life vest is arranged -- need for outpatient ICD, per cardiology  - LVEF 10-15% (02/2018)   - PO diuresis -- changed back to IV  - daily weights  - strict intake and output  Appreciate cardiology follow-up.  --> primacor drip has been discontinued.    # Fever,-probably bronchitis  - procalcitonin 0.10, CXR seems no acute process in comparison to initial cxr, wbc count wnl  Blood culture from January 24, no growth  - UA: no UTI  Chest x-ray is negative  We will start patient on empiric Levaquin.    CKD 3 -- CR trending down  Somewhat expected     # Atrial fibrillation, with intermittent RVR .   - continue beta blocker  - resume eliquis   Patient to undergo TEE cardioversion in a.m.    # COPD-- seem stable   -- needing cough med  - continue PRN inhalers  - not acutely SOB     # Hypertension, well controlled  - pt was without medications for 1-2 weeks  - continue home medications, metoprolol   -- Entresto when/if able    - will continue to monitor       # Tobacco  Use  - educated upon cessation  - continue nicotine patch      # depression -- pt lost his wife recently and daughter thinks he is very depressed  -- daughter requested antidepressant   -- remeron HS    Patient's current condition and plan of care discussed with his daughter over the phone.    Disposition: Palliative care consult requested, patient likely be discharged ready soon once cleared by cardiology.  DVT PPX:   eliquis   Code:            Full Code           Code Status: Full Code             MEDICATIONS     Current Facility-Administered Medications   Medication Dose Route Frequency    benzonatate  100 mg Oral BID    famotidine  20 mg Oral Daily    [START ON 05/27/2018]  furosemide  40 mg Oral Daily    guaiFENesin  600 mg Oral Q12H SCH    lactobacillus species  50 Billion CFU Oral Daily    levoFLOXacin  750 mg Oral Q48H    metoprolol succinate XL  150 mg Oral BID    mirtazapine  15 mg Oral QHS    nicotine  1 patch Transdermal Daily    rivaroxaban  15 mg Oral Daily with dinner    sodium chloride (PF)  3 mL Intravenous Q8H       PHYSICAL EXAM     Vitals:    05/26/18 1531   BP: 118/83   Pulse: (!) 49   Resp: 19   Temp: (!) 96.8 F (36 C)   SpO2: 97%       Temperature: Temp  Min: 96.8 F (36 C)  Max: 99.7 F (37.6 C)  Pulse: Pulse  Min: 49  Max: 90  Respiratory: Resp  Min: 19  Max: 20  Non-Invasive BP: BP  Min: 118/83  Max: 164/95  Pulse Oximetry SpO2  Min: 92 %  Max: 97 %      Constitutional: Seems to be NAD   HEENT: NC/AT, PERRL,EOMI. Neck supple  Cardiovascular: RRR, normal S1 S2, no murmurs, gallops.  Respiratory: Very subtle crackles bibasilarly Normal rate. No retractions or increased work of breathing.  Gastrointestinal: Normal BS, non-distended, soft, non-tender, no rebound or guarding.  Genitourinary: no suprapubic or costovertebral angle tenderness  Musculoskeletal: ROM and motor strength grossly normal. No clubbing, edema.   Skin: no rashes, jaundice , cyanosis  Neurologic: AAO x3.  CN 2-12 grossly intact. No gross motor or sensory deficits  Psychiatric: Affect and mood appropriate. Cooperative.     LABS     Recent Labs   Lab 05/26/18  1033 05/25/18  0356 05/24/18  0513   WBC 6.7 6.1 5.8   RBC 5.37 4.96 4.93   Hemoglobin 16.8 15.3 15.3   Hematocrit 50.4 46.2 46.2   MCV 94 93 94   PLT CT 186 181 196       Recent Labs   Lab 05/26/18  1033 05/25/18  0356 05/24/18  0513 05/23/18  0301 05/22/18  0324  05/20/18  0231   Sodium 137 134* 135* 133* 135*  More results in Results Review 140   Potassium 3.8 3.4* 3.4* 3.6 3.6  More results in Results Review 3.9   Chloride 103 102 101 100 101  More results in Results Review 105   CO2 23.9 21 21 23 25   More results  in Results  Review 26   BUN 30* 33* 41* 27* 22  More results in Results Review 23*   Creatinine 1.75* 1.91* 2.20* 1.69* 1.59*  More results in Results Review 1.36*   Glucose 114* 116* 100* 96 99  More results in Results Review 113*   Calcium 8.7 8.1* 8.3* 8.4* 8.4*  More results in Results Review 8.9   Magnesium  --   --   --   --   --   --  1.7   More results in Results Review = values in this interval not displayed.       Recent Labs   Lab 05/23/18  0301   ALT 28   AST (SGOT) 22   Bilirubin, Total 0.8   Albumin 3.2*   Alkaline Phosphatase 80                       Signed,  Brantley Persons, MD  5:51 PM 05/26/2018

## 2018-05-26 NOTE — Progress Notes (Addendum)
NURSE NOTE SUMMARY  [facility] - SURG TELEMETRY STEP-DOWN   Patient Name: Christopher Dickerson, Christopher SR.   Attending Physician: Brantley Persons, MD   Today's date:   05/26/2018 LOS: 8 days   Shift Summary:                                                              Pt resting in bed, amio gtt running. No issues at this time. Will monitor.    1800: pt had diarrhea- Bhansali notified, cdiff rule out.      Provider Notifications:      Rapid Response Notifications:  Mobility:      PMP Activity: Step 6 - Walks in Room (05/26/2018  3:51 AM)     Weight tracking:  Family Dynamic:   Last 3 Weights for the past 72 hrs (Last 3 readings):   Weight   05/26/18 0603 85.6 kg (188 lb 11.4 oz)   05/25/18 0218 87.2 kg (192 lb 3.9 oz)   05/24/18 0350 87.5 kg (192 lb 14.4 oz)          Health Care Agent: Victorino December - Daughter - [phone]     Recent Vitals Last Bowel Movement   BP: (!) 134/93 (05/26/2018 11:55 AM)  Heart Rate: 68 (05/26/2018 11:55 AM)  Temp: 97.9 F (36.6 C) (05/26/2018 11:55 AM)  Resp Rate: 19 (05/26/2018 11:55 AM)  Weight: 85.6 kg (188 lb 11.4 oz) (05/26/2018  6:03 AM)  SpO2: 96 % (05/26/2018 11:55 AM)   Last BM Date: 05/26/18

## 2018-05-26 NOTE — Progress Notes (Addendum)
Cardiology Progress Note      Date Time: 05/26/18 9:22 AM  Patient Name: Christopher Dickerson, Christopher SR.      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  50% in the diagonal branch.  30% in the RCA.  20% in the PDA   Mild tricuspid regurgitation.    Tele atrial fibrillation with heart rate in the 60s to 70s  Urine output -3 L since admission  Potassium 3.4  Bicarb 21  BNP January 26 was 103  Plan:   I suspect that the cardiomyopathy is at least partly due to the tachycardia and atrial fibrillation with a possible alcohol in the past.  BNP is 103.  Patient is coughing.    Echocardiogram November 2019: The left ventricle is moderately to severely dilated at 6.3 cm the systolic function is severely depressed with an estimated EF = 10-15 %.  Left atrium is moderately dilated.  Mild tricuspid regurgitation      1. Switch Lasix to 40 mg tablets p.o.  2. Continue on Toprol 150 mg twice a day  3. Continue amiodarone drip for another 24 hours  4. We will plan for TEE and cardioversion tomorrow  5. Which to Xarelto hoping for better compliance from GFR is 34-Xarelto 15 mg  6. Continue on Plavix and Lipitor  7. Patient has a LifeVest on  8. I discussed with the patient the importance of following as an outpatient-he has not followed up as an outpatient since his last hospitalization  9. Patient has some social issues.  His daughter is coming from [place].      Subjective:   The patient mentions he feels better.  The patient denies chest pain, worsening DIB (difficulty in breathing), palpitations or dizziness.       Physical Exam:     Temp:  [97.5 F (36.4 C)-99.7 F (37.6 C)] 97.5 F (36.4 C)  Heart Rate:  [59-105] 78  Resp Rate:  [18-20] 20  BP: (115-164)/(69-104) 130/80    Wt Readings from Last 3 Encounters:   05/26/18 85.6 kg (188 lb 11.4 oz)   03/04/18 90.6 kg (199 lb 11.8  oz)   07/04/17 90.5 kg (199 lb 8.3 oz)            Intake/Output Summary (Last 24 hours) at 05/26/2018 1610  Last data filed at 05/25/2018 1819  Gross per 24 hour   Intake 0 ml   Output 1200 ml   Net -1200 ml       General appearance - alert, well appearing, and in no distress  Chest -clear bilaterally  Heart  irregular  Extremities - peripheral pulses normal, no pedal edema, no clubbing or cyanosis  Abd: soft. Non-tender, non-distended,  Neuro: alert and oriented  Skin: no rashes  Psych: normal affect    Medications:     Scheduled Meds: apixaban, 5 mg, Oral, Q12H SCH  benzonatate, 100 mg, Oral, BID  famotidine, 20 mg, Oral, Daily  furosemide, 40 mg, Intravenous, BID  guaiFENesin, 600 mg, Oral, Q12H SCH  metoprolol succinate XL, 150 mg, Oral, BID  mirtazapine, 15 mg, Oral, QHS  nicotine, 1 patch, Transdermal, Daily  sodium chloride (PF), 3 mL, Intravenous, Q8H        . amiodarone 0.5 mg/min (05/25/18 2013)         Labs:     Recent [employer]  05/25/18  0356 05/24/18  0513 05/23/18  0301  05/20/18  0231   Glucose 116* 100* 96  More results in Results Review 113*   BUN 33* 41* 27*  More results in Results Review 23*   Creatinine 1.91* 2.20* 1.69*  More results in Results Review 1.36*   Sodium 134* 135* 133*  More results in Results Review 140   Potassium 3.4* 3.4* 3.6  More results in Results Review 3.9   Chloride 102 101 100  More results in Results Review 105   CO2 21 21 23   More results in Results Review 26   Magnesium  --   --   --   --  1.7   AST (SGOT)  --   --  22  --   --    ALT  --   --  28  --   --    More results in Results Review = values in this interval not displayed.     Estimated Creatinine Clearance: 38.4 mL/min (A) (based on SCr of 1.91 mg/dL (H)).    No results for input(s): CHOL, TRIG, HDL, LDL in the last 8760 hours.    Recent Labs   Lab 05/25/18  0356 05/24/18  0513 05/23/18  1918   WBC 6.1 5.8 6.9   Hemoglobin 15.3 15.3 15.4   Hematocrit 46.2 46.2 46.8   PLT CT 181 196 212       Recent Labs   Lab  05/18/18  1245 05/18/18  1532 05/18/18  1813   TROPI 0.04* 0.05* 0.06*       Recent Labs   Lab 05/18/18  1245   TSH 2.75         Electronically signed by:  Harlin Rain, MD   [facility] cardiac and vascular medicine

## 2018-05-26 NOTE — Progress Notes (Signed)
Per the [facility] [employer] Automatic Renal Dosing Protocol:    The following medication was adjusted based on the patient's creatinine clearance:    Medication: Levofloxacin  Indication: CAP  SCr: 1.91 mg/dL  CrCl: 38 ml/min  Previous Dosing Regimen: 500 mg po daily  New Dosing Regimen: 750 mg po q48h      Jasmia Angst Merdis Delay, PharmD, [phone]  [facility] Department

## 2018-05-27 ENCOUNTER — Inpatient Hospital Stay: Payer: Medicare PPO

## 2018-05-27 LAB — ECG 12-LEAD
P Wave Axis: 32 deg
P-R Interval: 196 ms
Patient Age: 72 years
Q-T Interval(Corrected): 468 ms
Q-T Interval: 472 ms
QRS Axis: 115 deg
QRS Duration: 115 ms
T Axis: 135 years
Ventricular Rate: 59 //min

## 2018-05-27 LAB — IRON PROFILE
% Saturation: 25 % (ref 15–50)
Iron: 80 ug/dL (ref 50.0–175.0)
TIBC: 318 ug/dL (ref 250–450)
Transferrin: 227 mg/dL (ref 163.0–344.0)

## 2018-05-27 LAB — PHOSPHORUS: Phosphorus: 3 mg/dL (ref 2.3–4.7)

## 2018-05-27 LAB — BASIC METABOLIC PANEL
Anion Gap: 17.4 mMol/L (ref 7.0–18.0)
BUN / Creatinine Ratio: 19 Ratio (ref 10.0–30.0)
BUN: 32 mg/dL — ABNORMAL HIGH (ref 7–22)
CO2: 21 mMol/L (ref 20–30)
Calcium: 8.8 mg/dL (ref 8.5–10.5)
Chloride: 103 mMol/L (ref 98–110)
Creatinine: 1.68 mg/dL — ABNORMAL HIGH (ref 0.80–1.30)
EGFR: 40 mL/min/{1.73_m2} — ABNORMAL LOW (ref 60–150)
Glucose: 103 mg/dL — ABNORMAL HIGH (ref 71–99)
Osmolality Calculated: 281 mOsm/kg (ref 275–300)
Potassium: 4.4 mMol/L (ref 3.5–5.3)
Sodium: 137 mMol/L (ref 136–147)

## 2018-05-27 LAB — THYROID STIMULATING HORMONE (TSH), REFLEX ON ABNORMAL TO FREE T4, SERUM
T4 Free: 0.97 ng/dL (ref 0.70–1.48)
TSH: 5.03 u[IU]/mL — ABNORMAL HIGH (ref 0.40–4.20)

## 2018-05-27 LAB — VITAMIN B12: Vitamin B-12: 1568 pg/mL — ABNORMAL HIGH (ref 213–816)

## 2018-05-27 LAB — MAGNESIUM: Magnesium: 1.9 mg/dL (ref 1.6–2.6)

## 2018-05-27 MED ORDER — MIRTAZAPINE 15 MG PO TABS
7.5000 mg | ORAL_TABLET | Freq: Every evening | ORAL | Status: DC
Start: 2018-05-27 — End: 2018-05-27
  Filled 2018-05-27: qty 1

## 2018-05-27 MED ORDER — MIRTAZAPINE 7.5 MG PO TABS
7.5000 mg | ORAL_TABLET | Freq: Every evening | ORAL | 0 refills | Status: DC
Start: 2018-05-27 — End: 2018-06-01

## 2018-05-27 MED ORDER — AMIODARONE HCL 200 MG PO TABS
200.0000 mg | ORAL_TABLET | Freq: Every day | ORAL | Status: DC
Start: 2018-05-27 — End: 2018-05-27
  Administered 2018-05-27: 14:00:00 200 mg via ORAL
  Filled 2018-05-27: qty 1

## 2018-05-27 MED ORDER — MIDAZOLAM HCL 1 MG/ML IJ SOLN (WRAP)
INTRAMUSCULAR | Status: AC | PRN
Start: 2018-05-27 — End: 2018-05-27
  Administered 2018-05-27: 1 mg via INTRAVENOUS

## 2018-05-27 MED ORDER — FUROSEMIDE 40 MG PO TABS
40.00 mg | ORAL_TABLET | Freq: Every day | ORAL | 0 refills | Status: AC
Start: 2018-05-28 — End: 2018-06-27

## 2018-05-27 MED ORDER — VH BIO-K PLUS PROBIOTIC 50 BIL CFU CAPSULE
50.00 | DELAYED_RELEASE_CAPSULE | Freq: Every day | ORAL | 0 refills | Status: AC
Start: 2018-05-28 — End: 2018-06-02

## 2018-05-27 MED ORDER — AMIODARONE HCL 200 MG PO TABS
200.00 mg | ORAL_TABLET | Freq: Every day | ORAL | 0 refills | Status: AC
Start: 2018-05-28 — End: 2018-06-27

## 2018-05-27 MED ORDER — FENTANYL CITRATE (PF) 50 MCG/ML IJ SOLN (WRAP)
INTRAMUSCULAR | Status: AC
Start: 2018-05-27 — End: ?
  Filled 2018-05-27: qty 2

## 2018-05-27 MED ORDER — VH FENTANYL CITRATE 100 MCG/2 ML (NARRATOR)
INTRAMUSCULAR | Status: AC | PRN
Start: 2018-05-27 — End: 2018-05-27
  Administered 2018-05-27: 50 ug via INTRAVENOUS

## 2018-05-27 MED ORDER — MIDAZOLAM HCL 1 MG/ML IJ SOLN (WRAP)
INTRAMUSCULAR | Status: AC
Start: 2018-05-27 — End: ?
  Filled 2018-05-27: qty 4

## 2018-05-27 MED ORDER — RIVAROXABAN 15 MG PO TABS
15.00 mg | ORAL_TABLET | Freq: Every day | ORAL | 0 refills | Status: AC
Start: 2018-05-27 — End: 2018-06-26

## 2018-05-27 MED ORDER — METOPROLOL SUCCINATE ER 50 MG PO TB24
150.00 mg | ORAL_TABLET | Freq: Two times a day (BID) | ORAL | 0 refills | Status: AC
Start: 2018-05-27 — End: 2018-06-26

## 2018-05-27 MED ORDER — BENZOCAINE 20% MT SOLN (WRAP)
OROMUCOSAL | Status: AC
Start: 2018-05-27 — End: ?
  Filled 2018-05-27: qty 0.28

## 2018-05-27 MED ORDER — LIDOCAINE VISCOUS HCL 2 % MT SOLN
OROMUCOSAL | Status: AC
Start: 2018-05-27 — End: ?
  Filled 2018-05-27: qty 15

## 2018-05-27 MED ORDER — BENZONATATE 100 MG PO CAPS
100.00 mg | ORAL_CAPSULE | Freq: Two times a day (BID) | ORAL | 0 refills | Status: AC
Start: 2018-05-27 — End: 2018-06-06

## 2018-05-27 MED ORDER — LEVOFLOXACIN 750 MG PO TABS
750.0000 mg | ORAL_TABLET | ORAL | 0 refills | Status: DC
Start: 2018-05-27 — End: 2018-06-01

## 2018-05-27 MED ORDER — BENZOCAINE 20% MT SOLN (WRAP)
OROMUCOSAL | Status: AC | PRN
Start: 2018-05-27 — End: 2018-05-27
  Administered 2018-05-27: 1 via OROMUCOSAL

## 2018-05-27 MED ORDER — MIDAZOLAM HCL 1 MG/ML IJ SOLN (WRAP)
INTRAMUSCULAR | Status: AC | PRN
Start: 2018-05-27 — End: 2018-05-27
  Administered 2018-05-27: 2 mg via INTRAVENOUS

## 2018-05-27 NOTE — Progress Notes (Signed)
Brief Palliative Care Note (full note to follow)    Chart reviewed, pt examined, patient and daughter Christopher Dickerson interviewed. D/w Dr Lorenza Chick    Recommendations  SYMPTOMS  none    COMMUNICATION    The patient had just returned from his procedure and reported severe sleepiness and requested that I return later for further questions.  He then asked me to call his daughter Christopher Dickerson for further questions as she was his medical power of attorney.    I had a detailed conversation with Christopher Dickerson over the phone as she is currently in [place] but will be flying over possibly tomorrow to with managing his care at home after discharge.     We discussed about Mr. Broyles history of cardiomyopathy known for the past several years and not improving despite attempts by his cardiologist and primary care doctor.  Discussed that patient had not made his appointments to see the cardiology clinic and also did not follow-up with his PCP after his hospitalizations.  Seems he was noncompliant with any of his medications and he also continued to drink beer daily as well as smoke daily despite recommendations to the contrary.  It seems he is been depressed after the death of his wife about 8 months ago.  He still lives independently and required minimal help for activities of daily living, it seems that he has not been taking his medications like he should.     We discussed that he has end-stage nonischemic cardiomyopathy which is likely affect his longevity significantly.  We discussed that breathing issues as well as arrhythmias could cause rapid worsening of his symptoms and clinical condition despite optimal medical care and these events could be unpredictable.  This would likely cause him to return to the hospital frequently and also cause a significant decline in his functional status.    His daughter feels that if his depression is adequately managed and his home situation is adjusted, it would be worthwhile to try to get him on the right  medications and see how he does in the short-term.  She also wants to discuss these issues with him herself at home with her long-term planning can be performed.  I also brought up discussions about advanced care planning and she is not aware of him having signed any document to put his wishes in writing.  She requests for advanced care planning documents that she can discuss it with him when she sees him tomorrow.     At this point, patient has very minimal symptoms and is ambulatory  the patient is not a candidate for hospice but should his functional status decline, he will likely be one.  The daughter also declined any discussions about hospice until she has spoken to him in person.    TRANSITIONS  Suggest home health and PT evaluation prior to discharge

## 2018-05-27 NOTE — Procedures (Signed)
Direct Current Cardioversion  Procedure Note    Patient Name: Christopher Fyfe Sr.    Date of Birth: 02/18/1946    Referring Physician: Harlin Rain, MD    Indication: Atrial Fibrillation    Pre-procedural assessment:   ASA :2  Mallampati: 2    Moderate conscious Sedation:  Moderate conscious sedation was performed. A preprocedural assessment of the patient was completed including review of past medical/surgical history, previous anesthesia sedation experience and family history. Patient's medications and drug allergies were reviewed. Patient examined with vital signs reviewed.  Incremental amounts of medications given to attain moderate level of consciousness. There was continuous face-to-face attendance by myself with a trained nurse  monitoring the patient's consciousness and physiologic status throughout the procedure.     My total intraservice face-to-face time for moderate sedation: 15 Minutes.    Medications: Patient anesthetized with Versed 3 mg, Fentanyl 50 mcg.    Procedure:  Informed consent is obtained. TEE was performed prior to cardioversion and is separately reported. A 200 joule anterior posterior synchronized biphasic shock delivered with successful cardioversion to sinus rhythm.    Complications: None      Electronically signed by: Teressa Senter, MD 05/27/2018

## 2018-05-27 NOTE — Sedation Documentation (Signed)
Patient tolerated procedure well.

## 2018-05-27 NOTE — Progress Notes (Addendum)
Cardiology Progress Note      Date Time: 05/27/18 8:30 AM  Patient Name: Christopher Dickerson, Christopher SR.      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  50% in the diagonal branch.  30% in the RCA.  20% in the PDA   Mild tricuspid regurgitation.    Tele atrial fibrillation with heart rate in the 60s to 70s    Plan:   I suspect that the cardiomyopathy is at least partly due to the tachycardia and atrial fibrillation with a possible alcohol in the past.  BNP is 103.  Patient is coughing.    Echocardiogram November 2019: The left ventricle is moderately to severely dilated at 6.3 cm the systolic function is severely depressed with an estimated EF = 10-15 %.  Left atrium is moderately dilated.  Mild tricuspid regurgitation    I suspect that the patient drinks alcohol more than he admits to.    1. Continue Lasix 40 mg once a day  2. Continue on Toprol 150 mg twice a day  3. We will switch to amiodarone tablets 200 mg after cardioversion  4. We will plan for TEE and cardioversion tomorrow; if this works, we will switch the amiodarone tablets otherwise if it does not and the patient remains in sinus rhythm, we will continue only with rate control with Toprol  5. Continue on Xarelto 15 mg  6. Continue on Plavix and Lipitor  7. Patient has a LifeVest on  8. I discussed with the patient the importance of following as an outpatient-he has not followed up as an outpatient since his last hospitalization  9. Patient has some social issues.  His daughter is coming from [place].  10. Complete abstinence from alcohol  11. Iron studies, TSH, thiamine level and B12  12. Patient will be able to be discharged from the cardiac standpoint today, after the TEE and cardioversion attempt, with follow-up in 2 weeks      Addendum:  Patient is in sinus rhythm-switched him from IV amiodarone to p.o.  amiodarone.  Continue on Toprol, Xarelto, Plavix and Lipitor  Iron studies and TSH as well as B12 were normal.  Thiamine is still pending  Okay to be discharged from cardiac standpoint.  Appointment will be scheduled with the physician assistant in 2 weeks and again in 1 month and with me on the first availability.  We will check his echocardiogram in 3 months.  We will retest the thiamine if it has not resulted as an outpatient      Subjective:   The patient denies chest pain, worsening DIB (difficulty in breathing), palpitations or dizziness.       Physical Exam:     Temp:  [96.4 F (35.8 C)-98.1 F (36.7 C)] 97.7 F (36.5 C)  Heart Rate:  [44-85] 77  Resp Rate:  [16-19] 19  BP: (118-140)/(81-97) 136/97    Wt Readings from Last 3 Encounters:   05/27/18 85.4 kg (188 lb 4.4 oz)   03/04/18 90.6 kg (199 lb 11.8 oz)   07/04/17 90.5 kg (199 lb 8.3 oz)            Intake/Output Summary (Last 24 hours) at 05/27/2018 0830  Last data filed at 05/26/2018 2133  Gross per 24 hour   Intake 0 ml   Output 600 ml   Net -600 ml  General appearance - alert, well appearing, and in no distress  Chest -clear bilaterally  Heart  irregular  Extremities - peripheral pulses normal, no pedal edema, no clubbing or cyanosis  Abd: soft. Non-tender, non-distended,  Neuro: alert and oriented  Skin: no rashes  Psych: normal affect    Medications:     Scheduled Meds: benzonatate, 100 mg, Oral, BID  famotidine, 20 mg, Oral, Daily  furosemide, 40 mg, Oral, Daily  guaiFENesin, 600 mg, Oral, Q12H SCH  lactobacillus species, 50 Billion CFU, Oral, Daily  levoFLOXacin, 750 mg, Oral, Q48H  metoprolol succinate XL, 150 mg, Oral, BID  mirtazapine, 15 mg, Oral, QHS  nicotine, 1 patch, Transdermal, Daily  rivaroxaban, 15 mg, Oral, Daily with dinner  sodium chloride (PF), 3 mL, Intravenous, Q8H        . amiodarone 0.5 mg/min (05/26/18 1510)         Labs:     Recent Labs   Lab 05/27/18  0442 05/26/18  1033 05/25/18  0356  05/23/18  0301   Glucose 103* 114*  116*  More results in Results Review 96   BUN 32* 30* 33*  More results in Results Review 27*   Creatinine 1.68* 1.75* 1.91*  More results in Results Review 1.69*   Sodium 137 137 134*  More results in Results Review 133*   Potassium 4.4 3.8 3.4*  More results in Results Review 3.6   Chloride 103 103 102  More results in Results Review 100   CO2 21 23.9 21  More results in Results Review 23   Magnesium 1.9  --   --   --   --    AST (SGOT)  --   --   --   --  22   ALT  --   --   --   --  28   More results in Results Review = values in this interval not displayed.     Estimated Creatinine Clearance: 43.6 mL/min (A) (based on SCr of 1.68 mg/dL (H)).    No results for input(s): CHOL, TRIG, HDL, LDL in the last 8760 hours.    Recent Labs   Lab 05/26/18  1033 05/25/18  0356 05/24/18  0513   WBC 6.7 6.1 5.8   Hemoglobin 16.8 15.3 15.3   Hematocrit 50.4 46.2 46.2   PLT CT 186 181 196       Recent Labs   Lab 05/18/18  1245 05/18/18  1532 05/18/18  1813   TROPI 0.04* 0.05* 0.06*       Recent Labs   Lab 05/18/18  1245   TSH 2.75         Electronically signed by:  Harlin Rain, MD   [facility] cardiac and vascular medicine

## 2018-05-27 NOTE — UM Notes (Signed)
Stress test, intermediate risk with partial reversible defect in prox to mid anterior region.  Milrinone gtt has been d/c'd, amio gtt to transitioning  to oral still on currently .  Zoll lifevest as been approved and pt has been fitted.  Palliative consult pending Plan to discharge home with support from family  Letter given anticipate d/c soon

## 2018-05-27 NOTE — Plan of Care (Signed)
Problem: Moderate/High Fall Risk Score >5  Goal: Patient will remain free of falls  Outcome: Progressing  Flowsheets (Taken 05/26/2018 1200 by Marilynn Latino, RN)  VH Moderate Risk (6-13): ALL REQUIRED LOW INTERVENTIONS;YELLOW "FALL RISK" ARM BAND;YELLOW NON-SKID SLIPPERS;INITIATE YELLOW "FALL RISK" SIGNAGE

## 2018-05-27 NOTE — ACP (Advance Care Planning) (Signed)
Advance Care Planning Services    Patient Name: Christopher Dickerson, Christopher SR. LOS: 9 days   Attending Physician: Brantley Persons, MD   Primary Care Physician: Gerilyn Pilgrim, MD   Narrative of Meeting   Events prompting this discussion:  Acute systolic heart failure, EF 10 to 15%     Major disease and the trajectory:  Ischemic cardiomyopathy  CKD  COPD  A. fib RVR     The types of care discussed:  Standard of care for disease, Resuscitation with CPR and Intubation/TracheostomyTube     The Patient's/Surrogate's wishes and goals for treatment:  DNR, support okay    Active Problem List  Active Problems:    Atrial fibrillation with RVR    SOB (shortness of breath)    Noncompliance with medication regimen    Hypertension, unspecified type    Paroxysmal atrial fibrillation    Acute on chronic systolic congestive heart failure        Documents Filled Out As Part of Discussion:                Orders placed in epic  Existing Documents that were reviewed and discussed with the patient/surrogate:  None   Acknowledgment of patient's right to decline advance care planning:  The patient has medical decision-making capacity and was represented by his daughter.  The patient/surrogate was informed that this session is completely voluntary and was given the opportunity to decline the ACP services. The patient/surrogate decided to participate in the ACP session with the discussion proceeding as described above.   I have spent 18 minutes on face-to-face Advance Care Planning Services   100% of the time was spent on discussion and counseling the patient and daughter.   No active management of the problems listed above was undertaken during the time period reported.       Medical decision-making capacity can be determined by patient's ability to have:  1.     Factual understanding of current situation and issues;   2.     Interpretation of the information presented.   3.     Reasoning ability about decisions/choices.  4.     Execution  of voluntary decision without coercion.   Advance care planning:             Improves end of life care            Improves patient & family satisfaction            Reduces stress, anxiety, & depression in surviving relatives  Reference:  Detering et al., BMJ 2010; 340 doi: PopPath.it (Published 20 July 2008)   Brantley Persons, MD  05/27/18 1:11 PM  MRN: [id number]                                       CSN: [id number]                                             DOB: June 18, 1945

## 2018-05-27 NOTE — Sedation Documentation (Signed)
200 Joule BP SYNC CV, SR

## 2018-05-27 NOTE — Sedation Documentation (Signed)
Patient experienced no respiratory distress during procedure.

## 2018-05-27 NOTE — Discharge Summary (Addendum)
DISCHARGE SUMMARY - [facility]    Patient Name: Christopher Dickerson, Christopher SR.  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   - life vest is arranged -- need for outpatient ICD, per cardiology  - LVEF 10-15% (02/2018)   -PO diuresis  Appreciate cardiology follow-up.   primacor drip has been discontinued.    # Fever,-probably bronchitis, improving  - procalcitonin 0.10, CXR seems no acute process in comparison to initial cxr, wbc count wnl  Blood culture from January 24, no growth  - UA: no UTI  Chest x-ray is negative  Patient was started on Levaquin but will change to Ceftin to avoid QT prolongation.    CKD 3 -- CR trending down  Somewhat expected     # Atrial fibrillation, with intermittent RVR .   - continue beta blocker, amiodarone  Eliquis changed to Xarelto as it is daily  Patient underwent TEE cardioversion today    # COPD-- seem stable   - continue PRN inhalers  - not acutely SOB     # Hypertension, well controlled  Continue metoprolol   -- Entresto when/if able      # Tobacco Use  - educated upon cessation  - continue nicotine patch    # depression -- pt lost his wife recently and daughter thinks he is very depressed  -- daughter requested antidepressant   Remeron was started but discontinued as patient also on amiodarone and because prolongation of QT interval    Discharge Medications:                                                                            Medication List      START taking these medications    amiodarone  200 MG tablet  Commonly known as:  PACERONE  Take 1 tablet (200 mg total) by mouth daily  Notes to patient:  New Medication  Next Dose Due 1/30     benzonatate 100 MG capsule  Commonly known as:  TESSALON  Take 1 capsule (100 mg total) by mouth 2 (two) times daily for 10 days  Notes to patient:  New Medication  Next Dose Due 1/29     cefuroxime 500 MG tablet  Commonly known as:  Ceftin  Take 1 tablet (500 mg total) by mouth 2 (two) times daily for 5 days     furosemide 40 MG tablet  Commonly  known as:  LASIX  Take 1 tablet (40 mg total) by mouth daily  Notes to patient:  New Medication  Next Dose Due 1/30     lactobacillus species  capsule  Commonly known as:  BIO-K PLUS  Take 1 capsule (50 Billion CFU total) by mouth daily for 5 days  Notes to patient:  New Medication  Next Dose Due 1/30     metoprolol succinate XL 50 MG 24 hr tablet  Commonly known as:  TOPROL-XL  Take 3 tablets (150 mg total) by mouth 2 (two) times daily  Notes to patient:  New Medication  Next Dose Due 1/29     rivaroxaban 15 MG Tabs  Commonly known as:  XARELTO  Take 1 tablet (15 mg total) by mouth daily with dinner  Notes to patient:  New Medication  Next Dose Due 1/29        CONTINUE taking these medications    albuterol 108 (90 Base) MCG/ACT inhaler  Commonly known as:  PROVENTIL HFA;VENTOLIN HFA     fluticasone 50 MCG/ACT nasal spray  Commonly known as:  FLONASE     omeprazole 40 MG capsule  Commonly known as:  PriLOSEC  Notes to patient:  Next Dose Due 1/29        STOP taking these medications    Eliquis 5 MG  Generic drug:  apixaban     lisinopril 20 MG tablet  Commonly known as:  PRINIVIL,ZESTRIL           Where to Get Your Medications      These medications were sent to [facility] [place], [place] - [address]  [address], [place] [address]    Phone:  [phone]    amiodarone 200 MG tablet   benzonatate 100 MG capsule   furosemide 40 MG tablet   lactobacillus species  capsule   metoprolol  succinate XL 50 MG 24 hr tablet   rivaroxaban 15 MG Tabs     Information about where to get these medications is not yet available    Ask your nurse or doctor about these medications   cefuroxime 500 MG tablet         Consultations:                                                                                       Cardiology  Palliative Care    Labs:                                                                                         Recent Labs   Lab 05/26/18  1033 05/25/18  0356 05/24/18  0513   WBC 6.7 6.1 5.8   RBC 5.37 4.96 4.93   Hemoglobin 16.8 15.3 15.3  Hematocrit 50.4 46.2 46.2   MCV 94 93 94   PLT CT 186 181 196       Recent Labs   Lab 05/27/18  0442 05/26/18  1033 05/25/18  0356 05/24/18  0513 05/23/18  0301   Sodium 137 137 134* 135* 133*   Potassium 4.4 3.8 3.4* 3.4* 3.6   Chloride 103 103 102 101 100   CO2 21 23.9 21 21 23    BUN 32* 30* 33* 41* 27*   Creatinine 1.68* 1.75* 1.91* 2.20* 1.69*   Glucose 103* 114* 116* 100* 96   Calcium 8.8 8.7 8.1* 8.3* 8.4*   Magnesium 1.9  --   --   --   --        Recent Labs   Lab 05/23/18  0301   ALT 28   AST (SGOT) 22   Bilirubin, Total 0.8   Albumin 3.2*   Alkaline Phosphatase 80             Imaging studies:                                                                                       Xr Chest 2 Views    Result Date: 05/23/2018  Life vest evident which limits some assessment. No pneumothorax. Mild central vascular engorgement. No acute airspace consolidation or sizable pleural effusions. Stable cardiomegaly. No acute osseous abnormality. ReadingStation:[id number]    Xr Chest Ap Portable    Result Date: 05/22/2018  No acute airspace consolidation. No pneumothorax or pleural effusion. Stable cardiomegaly. No acute osseous abnormality. Life vest evident which limits some assessment. ReadingStation:[email or web address]    Xr Chest Ap Portable    Result Date: 05/18/2018  Cardiomegaly with trace interstitial edema and small left effusion. No focal consolidation or  pneumothorax. ReadingStation:[id number]      Culture results:                                                                                         Microbiology Results     Procedure Component Value Units Date/Time    Blood Culture-Venipuncture #1 [[id number]] Collected:  05/22/18 1012    Specimen:  Blood from Venipuncture Updated:  05/27/18 1220    Narrative:       Specimen/Source: Blood/Venipuncture  Collected: 05/22/2018 10:12     Status: Final      Last Updated: 05/27/2018 12:18                Culture Result (Final)      No Growth in 5 Days          Blood Culture-Venipuncture #2 [[id number]] Collected:  05/22/18 1012    Specimen:  Blood from Venipuncture Updated:  05/27/18 1220    Narrative:       Specimen/Source: Blood/Venipuncture  Collected: 05/22/2018 10:12  Status: Final      Last Updated: 05/27/2018 12:18                Culture Result (Final)      No Growth in 5 Days                Hospital Course:                                                                                    For full details of the patient's admission please consult the whole medical record including daily progress notes, history and physical, consult notes, lab reports as well as imaging studies.  Briefly 72 y.o.malepatient with past medical history of hypertension, COPD without oxygen, cardiomyopathy and atrial fibrillation presented to ED with worsening shortness of breath and lower extremity edema. Pt states that he was unable to get his home medications refilled because he could not get into contact with his PCP. BNP elevated and CXR demonstrated interstitial edema while in the ED. Previous had cardiology follow up outpatient but patient did not proceed with plan for cardiaccath. Pt admitted for IV diuresis and further evaluation.  05/20/2018: Early morning experiencedan episode ofV-tach that spontaneous resolved. Cardiac cath performed that revealed CAD in LAD, with further evaluation needed with stress  test  05/21/2018: Per cardiology recommendation, life vest ordered for patient, will need outpatient placement of ICD. Nuclear stress test performed.  05/22/2018: Pt has life vest in place. Pt spiked a fever throughout the night of 102.6 rectally, afebrile this morning. Denied new complaints. CBC, blood cultures, UA and CXR ordered for further evaluation.   Patient had low-grade fever last few days with cough.  Patient is feeling better with decreased cough today.  Patient has been afebrile overnight.  Patient underwent TEE cardioversion today and recommended to continue oral amiodarone, Xarelto, Plavix and Lipitor.  Patient is extremely anxious to be discharged home today, patient's daughter coming from [place] to be with patient for next 1 week.  Patient is also seen by palliative care, CODE STATUS discussed with patient and the daughter who wants patient to be DNR.    Discharge Day Exam:  Temp:  [96.4 F (35.8 C)-98.1 F (36.7 C)] 97 F (36.1 C)  Heart Rate:  [44-91] 59  Resp Rate:  [11-23] 19  BP: (116-154)/(81-115) 154/98    Weight Monitoring 05/21/2018 05/22/2018 05/23/2018 05/24/2018 05/25/2018 05/26/2018 05/27/2018   Height - - - - - - -   Height Method - - - - - - -   Weight 89.9 kg 89.2 kg 87.1 kg 87.5 kg 87.2 kg 85.6 kg 85.4 kg   Weight Method Standing Scale Standing Scale Standing Scale Standing Scale Standing Scale Standing Scale Standing Scale   BMI (calculated) - - - - - - -         General:  Moderately built, no acute distress  Psychiatry :awake, alert, oriented x 3; mood is appropriate.  Cardiovascular: regular rate and rhythm,S1 and S2 normal.  Respiratory: clear to auscultation bilaterally, no wheezing, non-labored breathing.  Abdomen: soft, Non-tender, non-distended, audible bowel sounds. No palpable mass.  Extremities: no clubbing, cyanosis, or edema. Expected degrees  of motion in all 4 extremities  Neuro: CN II-XII intact, no gross focal deficit    Discharge condition: stable    Discharge  instructions:                                                                             Nutrition: Heart healthy diet    Activity: As tolerated     Follow up:  Follow-up Information     Gerilyn Pilgrim, MD Follow up in 1 week(s).    Specialty:  Family Medicine  Why:  Office should call you regarding follow up. If they do not call you, please call them.    Contact information:  [address]  [place] [address]  [phone]             Harlin Rain, MD Follow up.    Specialties:  Cardiology, Cardiovascular Disease  Why:  On 06/29/2018 @ 10:20 AM with Randell Patient. Annabell Howells, PA-C.   Contact information:  [address]  [place] [address]  [phone]                   Time spent coordinating discharge and reviewing discharge plan: 45 minutes    Signed by:   Brantley Persons  05/27/2018  5:04 PM    [facility]  Office number [phone]    CC: Gerilyn Pilgrim, MD

## 2018-05-27 NOTE — Progress Notes (Signed)
[facility]  PROGRESS NOTE      Patient: Christopher Pankow Sr.  Date: 05/27/2018   LOS: 9 Days  Admission Date: 05/18/2018   MRN: [id number]  Attending: Brantley Persons, MD       INTERIM SUMMARY   Christopher Fenton Sr. is a 73 y.o. male patient with past medical history of hypertension, COPD without oxygen, cardiomyopathy and atrial fibrillation presented to ED with worsening shortness of breath and lower extremity edema. Pt states that he was unable to get his home medications refilled because he could not get into contact with his PCP. BNP elevated and CXR demonstrated interstitial edema while in the ED. Previous had cardiology follow up outpatient but patient did not proceed with plan for cardiaccath. Pt admitted for IV diuresis and further evaluation.  05/20/2018: Early morning experienced an episode ofV-tach that spontaneous resolved. Cardiac cath performed that revealed CAD in LAD, with further evaluation needed with stress test  05/21/2018: Per cardiology recommendation, life vest ordered for patient, will need outpatient placement of ICD. Nuclear stress test performed.  05/22/2018: Pt has life vest in place.  Pt spiked a fever throughout the night of 102.6 rectally, afebrile this morning. Denied new complaints.  CBC, blood cultures, UA and CXR ordered for further evaluation.     Patient's infectious work-up so far not revealing.  Patient's fevers phased out after patient started on empiric Levaquin.     SUBJECTIVE     Christopher Fenton Sr.     Patient is resting comfortably, denies any chest pain, shortness of breath, nausea or vomiting.  Patient had low-grade fever last few days with cough.  Patient is feeling better with decreased cough today.  Patient has been afebrile overnight.      ASSESSMENT/PLAN     [name] is a 73 y.o. 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     - life vest is arranged -- need for outpatient ICD, per cardiology  - LVEF 10-15% (02/2018)   - PO diuresis  Appreciate cardiology follow-up.   primacor drip has been discontinued.    # Fever,-probably bronchitis  - procalcitonin 0.10, CXR seems no acute process in comparison to initial cxr, wbc count wnl  Blood culture from January 24, no growth  - UA: no UTI  Chest x-ray is negative  We will start patient on empiric Levaquin.    CKD 3 -- CR trending down  Somewhat expected     # Atrial fibrillation, with intermittent RVR .   - continue beta blocker, amiodarone  Eliquis changed to Xarelto as it is daily  Patient underwent TEE cardioversion today    # COPD-- seem stable   - continue PRN inhalers  - not acutely SOB     # Hypertension, well controlled  Continue metoprolol   -- Entresto when/if able    - will continue to monitor     # Tobacco Use  - educated upon cessation  - continue nicotine patch      # depression -- pt lost his wife recently and daughter thinks he is very depressed  -- daughter requested antidepressant   -- remeron HS    Patient's current condition and plan of care discussed with his daughter over the phone.  Disposition: Palliative care consult requested, patient likely be discharged ready soon once cleared by cardiology.    DVT PPX:   Xarelto   Code:            DNR, support okay           Code Status: NO CPR - SUPPORT OK             MEDICATIONS     Current Facility-Administered Medications   Medication Dose Route Frequency    amiodarone  200 mg Oral Daily    benzonatate  100 mg Oral BID    famotidine  20 mg Oral Daily    furosemide  40 mg Oral Daily    guaiFENesin  600 mg Oral Q12H SCH    lactobacillus species  50 Billion CFU Oral Daily    levoFLOXacin  750 mg Oral Q48H    metoprolol succinate XL  150 mg Oral BID    mirtazapine  15 mg Oral QHS    nicotine  1 patch  Transdermal Daily    rivaroxaban  15 mg Oral Daily with dinner    sodium chloride (PF)  3 mL Intravenous Q8H       PHYSICAL EXAM     Vitals:    05/27/18 1203   BP: (!) 129/96   Pulse: (!) 59   Resp: 19   Temp:    SpO2: 91%       Temperature: Temp  Min: 96.4 F (35.8 C)  Max: 98.1 F (36.7 C)  Pulse: Pulse  Min: 44  Max: 91  Respiratory: Resp  Min: 11  Max: 23  Non-Invasive BP: BP  Min: 116/85  Max: 142/115  Pulse Oximetry SpO2  Min: 90 %  Max: 100 %      Constitutional: Seems to be NAD   HEENT: NC/AT, PERRL,EOMI. Neck supple  Cardiovascular: RRR, normal S1 S2, no murmurs, gallops.  Respiratory: Very subtle crackles bibasilarly Normal rate. No retractions or increased work of breathing.  Gastrointestinal: Normal BS, non-distended, soft, non-tender, no rebound or guarding.  Genitourinary: no suprapubic or costovertebral angle tenderness  Musculoskeletal: ROM and motor strength grossly normal. No clubbing, edema.   Skin: no rashes, jaundice , cyanosis  Neurologic: AAO x3.  CN 2-12 grossly intact. No gross motor or sensory deficits  Psychiatric: Affect and mood appropriate. Cooperative.     LABS     Recent Labs   Lab 05/26/18  1033 05/25/18  0356 05/24/18  0513   WBC 6.7 6.1 5.8   RBC 5.37 4.96 4.93   Hemoglobin 16.8 15.3 15.3   Hematocrit 50.4 46.2 46.2   MCV 94 93 94   PLT CT 186 181 196       Recent Labs   Lab 05/27/18  0442 05/26/18  1033 05/25/18  0356 05/24/18  0513 05/23/18  0301   Sodium 137 137 134* 135* 133*   Potassium 4.4 3.8 3.4* 3.4* 3.6   Chloride 103 103 102 101 100   CO2 21 23.9 21 21 23    BUN 32* 30* 33* 41* 27*   Creatinine 1.68* 1.75* 1.91* 2.20* 1.69*   Glucose 103* 114* 116* 100* 96   Calcium 8.8 8.7 8.1* 8.3* 8.4*   Magnesium 1.9  --   --   --   --        Recent Labs   Lab 05/23/18  0301   ALT 28   AST (SGOT) 22   Bilirubin, Total 0.8   Albumin 3.2*  Alkaline Phosphatase 80                       Signed,  Brantley Persons, MD  1:23 PM 05/27/2018

## 2018-05-27 NOTE — Progress Notes (Signed)
Discharge instructions reviewed with patient. Patient verbalized understanding and had no questions.

## 2018-05-27 NOTE — Sedation Documentation (Signed)
Report called to [facility], Room 315.

## 2018-05-27 NOTE — Consults (Signed)
[facility] Palliative Care Service  Initial Comprehensive Assessment  Date, Time: 05/27/18 2:52 PM  Patient Name: Christopher Dickerson, Christopher Dickerson.  Referring Physician:  Brantley Persons, MD   Primary Care Physician: Christopher Pilgrim, MD  Consulting Team: Christopher Rias, MD; Christopher Medina, MD; Christopher Masters, PA; Christopher Coy, LCSW  Consulting Service: Palliative Medicine  Reason for Consult: goals of care, advance care planning and clarification of prognosis related to CHF  Palliative care is a specialized, interdisciplinary approach to improving comfort and quality of life at any stage of a serious illness by addressing symptoms, communication, and transitions.        Assessment & Recommendations     Impression / Assessment:  Dyspnea  End stage heart failure  Non-ischemic cardiomyopathy  CKD III  COPD   HTN    Recommendations / Plan:  Pain: none  Non-pain Symptoms: none  Other medication recommendations: none    Nutrition: cardiac diet  Consultants: none  Goals of care:      The patient had just returned from his procedure and reported sleepiness and requested that I return later for further questions.  He then asked me to call his daughter Christopher Dickerson for further questions as she was his medical power of attorney.    I had a detailed conversation with Christopher Dickerson over the phone as she is currently in [place] but will be flying over possibly tomorrow to help with managing his care at home after discharge.     We discussed about Christopher Dickerson history of cardiomyopathy known for the past several years and not improving despite attempts by his cardiologist and primary care doctor. We discussed that patient had not made his appointments to see the cardiology clinic and also did not follow-up with his PCP after his hospitalizations. It seems he was noncompliant with most of his medications and he also continued to drink beer daily as well as smoke daily despite recommendations to the contrary.  It seems he  is been depressed after the death of his wife about 8 months ago.  He still lives independently (his son Christopher Dickerson helps with groceries occ) and has required minimal other help for activities of daily living.     We discussed that he has end-stage heart failure which would cause significant  breathing issues as well as arrhythmias leading to rapid worsening of clinical condition despite optimal medical care and these events could be unpredictable. Over the next few months, this would likely cause him to return to the hospital frequently and also cause a significant decline in his functional status and significantly worsen his longevity and reduce his independence.    His daughter feels that if his depression is adequately managed and his home situation is adequately adjusted, he might do well in the short term. Therefore she feels that it would be worthwhile to try to get him on the right medications and see how he does in the short-term.  She also wants to discuss these issues with the patient herself at home so that further long-term planning can be performed.  I also brought up discussions about advanced care planning and she is not aware of him having signed any document to put his advanced care wishes in writing.  She requests for copies of advanced care planning documents that she can discuss it with him when she sees him tomorrow.     At this point, patient has no symptoms and is ambulatory without need for assistance. Currently he is not  a candidate for hospice but should his functional status decline, he will likely be one over the next few months. However Christopher Dickerson also declined any discussions about hospice until she has spoken to him in person.    The patient/family wants highest priority given to independence (function), as opposed to comfort or longevity.     Advance Directives:   Current CPR Status:  NO CPR - SUPPORT OK        Needs/wants further discussion of advance directives/code status:  [x]  Yes []   No  Disposition / Further attention / Follow-up:     Would benefit from further ACP documentation, if daughter is available                         Discussed with: Christopher Dickerson    Thank you, Christopher. Lorenza Dickerson, for this consult. Please do not hesitate to contact the Palliative Care Service if you have questions about the above recommendations.     Christopher Medina, MD    Palliative Care Service, [address], [address]  [address] [facility]  [place], [place] [address]       History of Present Illness     CC: "I'm sleepy"    This is a 73 y.o. male with past Hx of cardiomyopathy, A fib and COPD admitted 05/18/2018 with dyspnea and leg edema.    The patient has a long standing Hx of CHF but had failed to follow up with cardiology or take his medications regularly for the past several months. His current hospital stay was complicated by fever, V tach which have since resolved. He has been recommended for discharge with LifeVest. He also underwent TEE and cardioversion and seems to be doing better hemodynamically today.     The Palliative Care Service was consulted to assist with symptom management and goals of care.      The patient reported fatigue but denies any dyspnea on exertion. Per nursing, he has been ambulating well unassisted. He apparently received sedatives for the procedure today and reported sleepiness and deferred further discussions to his daughter.    At baseline, he is completely independent and lives by himself and even rives to his appointments, though his son Christopher Dickerson helps him out when he can.    ADLs prior to admission:    Independent Needs assistance Dependent   Ambulation [x]   []  []    Transferring [x]  []  []    Dressing [x]  []  []    Bathing [x]  []  []    Toileting [x]  []  []    Feeding [x]  []  []      Advance Care Planning:  Advance Directives:    Has:           []  Yes [x]  No       If yes, type of document: none  Discussed: [x]  Yes []  No       Health-care power of attorney: daughter Christopher Dickerson    Review of Systems     Review of  Systems   Constitutional: Positive for malaise/fatigue. Negative for diaphoresis and fever.   HENT: Negative for congestion, ear pain, nosebleeds and sinus pain.    Eyes: Negative for double vision, photophobia, pain and discharge.   Respiratory: Positive for cough. Negative for sputum production, shortness of breath and wheezing.    Cardiovascular: Positive for leg swelling. Negative for chest pain, palpitations and orthopnea.   Gastrointestinal: Negative for abdominal pain, constipation, diarrhea, nausea and vomiting.   Genitourinary: Negative for dysuria, frequency, hematuria and  urgency.   Musculoskeletal: Negative for back pain, joint pain and neck pain.   Neurological: Negative for dizziness, tremors, weakness and headaches.   Psychiatric/Behavioral: Positive for depression. Negative for hallucinations and suicidal ideas. The patient is not nervous/anxious.       Past Medical and Surgical History     Past Medical History:   Diagnosis Date   . Arthritis    . Atrial fibrillation    . Cardiomyopathy    . Chronic obstructive pulmonary disease    . Hypertension    . 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 APPENDECTOMY     . COLONOSCOPY, POLYPECTOMY  12/16/2012    Procedure: COLONOSCOPY, POLYPECTOMY;  Surgeon: Gwenith Spitz, MD;  Location: [facility];  Service: Gastroenterology;  Laterality: N/A;   . EGD, BIOPSY  12/16/2012    Procedure: EGD, BIOPSY;  Surgeon: Gwenith Spitz, MD;  Location: [facility];  Service: Gastroenterology;  Laterality: N/A;   . KNEE ARTHROSCOPY W/ ACL RECONSTRUCTION     . LIFT, ARM (MEDICAL)     . RIGHT & LEFT HEART CATH N/A 05/20/2018    Procedure: RIGHT & LEFT HEART CATH;  Surgeon: Rudean Curt, MD;  Location: [facility] CATH[facility]EP;  Service: Cardiovascular;  Laterality: N/A;  total heart cath 05/20/18       Social History     Social History     Tobacco Use   Smoking Status Current Every Day  Smoker   . Packs/day: 0.50   . Types: Cigarettes   Smokeless Tobacco Never Used     Social History     Substance and Sexual Activity   Alcohol Use Yes    Comment: occasional     Social History     Substance and Sexual Activity   Drug Use No     Social History     Social History Narrative   . Not on file      Marital Status: widowed; spouse expired 8 months ago  Lives with: alone  Family: one daughter in [place], one son in [place], local son Christopher Dickerson  Occupation: unable to obtain  Education level: unable to obtain  Spirituality (faith & importance, community, assessment/issues): not spiritual  What gives meaning to the patient? Staying independent at home     Emergency contact listed: Extended Emergency Contact Information  Primary Emergency Contact: Christopher Dickerson           [place], [place]  Home Phone: [phone]  Mobile Phone: [phone]  Relation: Daughter  Secondary Emergency Contact: Christopher Dickerson, Christopher Dickerson  Mobile Phone: [phone]  Relation: Son                                      Family History     History reviewed. No pertinent family history.    Medications     Medications:       Current Facility-Administered Medications   Medication Dose Route Frequency   . amiodarone  200 mg Oral Daily   . benzonatate  100 mg Oral BID   . famotidine  20 mg Oral Daily   . furosemide  40 mg Oral Daily   . guaiFENesin  600 mg Oral Q12H SCH   . lactobacillus species  50 Billion CFU Oral Daily   . levoFLOXacin  750 mg  Oral Q48H   . metoprolol succinate XL  150 mg Oral BID   . mirtazapine  7.5 mg Oral QHS   . nicotine  1 patch Transdermal Daily   . rivaroxaban  15 mg Oral Daily with dinner   . sodium chloride (PF)  3 mL Intravenous Q8H     acetaminophen **OR** acetaminophen **OR** acetaminophen, albuterol, dextromethorphan polistirex ER, guaiFENesin, naloxone, ondansetron **OR** ondansetron    Allergies     Allergies   Allergen Reactions   . Codeine        Physical Exam     BP (!) 129/96   Pulse (!) 59   Temp 97.7 F (36.5  C) (Oral)   Resp 19   Ht 1.829 m (6')   Wt 85.4 kg (188 lb 4.4 oz)   SpO2 91%   BMI 25.53 kg/m     Physical Exam   Constitutional: He is oriented to person, place, and time. No distress.   HENT:   Mouth/Throat: No oropharyngeal exudate.   Eyes: No scleral icterus.   Neck: No JVD present. No thyromegaly present.   Cardiovascular: Normal rate, regular rhythm and normal heart sounds.   No murmur heard.  Pulmonary/Chest: Effort normal and breath sounds normal. No respiratory distress. He has no wheezes. He has no rales.   Abdominal: Soft. Bowel sounds are normal. He exhibits no distension. There is no abdominal tenderness. There is no rebound.   Musculoskeletal: Normal range of motion.         General: Edema present.   Lymphadenopathy:     He has no cervical adenopathy.   Neurological: He is alert and oriented to person, place, and time.   Skin: Skin is warm. No erythema.   Vitals reviewed.     PPS: 70%    Labs / Radiology     Lab and diagnostics: reviewed by me.  Recent Labs   Lab 05/26/18  1033   WBC 6.7   Hemoglobin 16.8   Hematocrit 50.4   PLT CT 186        Recent Labs   Lab 05/27/18  0442   Sodium 137   Potassium 4.4   Chloride 103   CO2 21   BUN 32*   Creatinine 1.68*   EGFR 40*   Glucose 103*   Calcium 8.8        Recent Labs   Lab 05/23/18  0301   Bilirubin, Total 0.8   Protein, Total 5.9*   Albumin 3.2*   ALT 28   AST (SGOT) 22        Xr Chest 2 Views    Result Date: 05/23/2018  Life vest evident which limits some assessment. No pneumothorax. Mild central vascular engorgement. No acute airspace consolidation or sizable pleural effusions. Stable cardiomegaly. No acute osseous abnormality. ReadingStation:[id number]    Xr Chest Ap Portable    Result Date: 05/22/2018  No acute airspace consolidation. No pneumothorax or pleural effusion. Stable cardiomegaly. No acute osseous abnormality. Life vest evident which limits some assessment. ReadingStation:[email or web address]    Eval / Mgmt / Counseling Time     Evaluation and mgmt  spent on palliative care concepts, end of life symptom management, hospice care and/or referral, advance directives (including resuscitation options/choices), medications (current or proposed therapies and side effects), the principle problem (listed above) and discharge planning issues.  L3

## 2018-05-27 NOTE — Sedation Documentation (Signed)
TEE END.

## 2018-05-27 NOTE — Sedation Documentation (Signed)
Patient numbed pre TEE with Viscous Lidocaine and Hurricaine One.

## 2018-05-27 NOTE — Sedation Documentation (Signed)
Reviewed procedure with patient, he says he understands.

## 2018-05-27 NOTE — Discharge Instr - AVS First Page (Signed)
Please take your Discharge Paperwork and the Medication List with you to your Follow-Up Appointments.

## 2018-05-31 LAB — VITAMIN B1, WHOLE BLOOD: Vitamin B1 (Thiamine): 144 nmol/L (ref 78–185)

## 2018-06-01 MED ORDER — CEFUROXIME AXETIL 500 MG PO TABS
500.0000 mg | ORAL_TABLET | Freq: Two times a day (BID) | ORAL | 0 refills | Status: AC
Start: 2018-06-01 — End: 2018-06-06

## 2018-06-04 ENCOUNTER — Telehealth: Payer: Self-pay | Admitting: Cardiovascular Disease

## 2018-06-04 NOTE — Telephone Encounter (Signed)
[facility] called , PCP Dr Mikael Spray saw patient to day and wanted patient to start back on remeron . In the discharge summary it stated Remeron was started but discontinued as patient also on amiodarone and because prolongation of QT interval, she wants to see is it okay or not okay and wants Dr Jenetta Downer advise, phone number  [phone]

## 2018-06-05 NOTE — Telephone Encounter (Signed)
Please advise 

## 2018-06-05 NOTE — Telephone Encounter (Signed)
Ok to start.. EKG 2 weeks later

## 2018-06-09 NOTE — Telephone Encounter (Signed)
Pt's daughter called back in as she has not heard back. I relayed message below from 2/7. PT has an appt 06/29/2018 with Florentina Addison, is ECG ok to wait until then? Or would pt need to come in the week of the 25th for ECG?    Ahill PN

## 2018-06-10 NOTE — Telephone Encounter (Signed)
Okay to start, and EKG can be done the week of the 25th. Can you call patient as I am in clinic today. THANK YOU!

## 2018-06-10 NOTE — Telephone Encounter (Signed)
Tried to call pt, no answer, VM left    Ahill PN

## 2018-06-23 ENCOUNTER — Telehealth: Payer: Self-pay

## 2018-06-23 NOTE — Telephone Encounter (Signed)
Notes received

## 2018-06-23 NOTE — Telephone Encounter (Signed)
PCP was contacted/Faxed for recent office note and labs, Cardiac Testing with Tracing

## 2018-06-24 NOTE — Telephone Encounter (Signed)
Requested 2nd time for labs

## 2018-06-25 NOTE — Telephone Encounter (Signed)
No labs for this pt per Dr. Mikael Spray office

## 2018-06-28 NOTE — Progress Notes (Signed)
[facility]                                                                                                                Patient Name: Christopher Petion Sr.   Date of Birth: Sep 03, 1945  Age: 73 y.o.  Gender: male    Patient Care Team:  Gerilyn Pilgrim, MD as PCP - General (Family Medicine)  Glynis Smiles, FNP as Nurse Practitioner (Family Nurse Practitioner)    Chief Complaint: NICM (HFU); Congestive Heart Failure; and Atrial Fibrillation      Diagnosis      Plan: The patient is doing well today. He feels much better since leaving the hospital- his SOB and LE swelling have improved significantly. The patient reports that he is not currently taking Toprol, he is unsure of why he stopped it (he was discharged on this medication). I am going to start the patient on Toprol 25mg . He is going to get a BMP today, we will potentially add Lisinopril 2.5mg  after we get results from BMP.      He is currently wearing a life vest, but he has been having issues with the device. While in the office, we contacted the [employer] rep who has scheduled for someone to go out to the patient's house today. I told him to let us know if he does not get the life vest fixed. He is well compensated on exam, no need for additional diuresis at this time.     Pt is unsure if he is taking remeron, I am going to have my nurse call him tomorrow at home to confirm. If he has not started it, we will have him get an EKG 2 weeks after he starts it.     We discussed the signs and symptoms of bleeding and a stroke and I asked the patient to seek medical attention if either of them are suspected.    He has an appointment on 3-30 with Dr. Mindi JunkerJonah Blue, we will get an ECHO on the same day. I recommend the following:     - Continue Lasix 40mg  once a day  - Start Toprol 25mg  daily   - Continue Amiodarone 200mg    - Continue Xarelto 15mg   - Continue Lipitor   - Complete abstinence from alcohol   - Avoid QT longing  medications  - BMP and Lipid panel today   - Continue to follow with PCP   - Avoid salt in diet.     I discussed the above plan with Dr. Monte Fantasia who agrees.     Respectfully,     Electronically signed by:  [facility]   Phone: [phone]  Fax: [phone]       History of Present Illness   Christopher Courtwright Sr. is a 73 y.o. male with a past medical history significant for Non-ischemic cardiomyopathy, Systolic dysfunction (10-15% EF), CKD III, paroxysmal atrial fibrillation, COPD, HTN, Tobacco  use who comes to the office for a follow up visit. Pt is established with Dr. Mindi Junker- Jonah Blue.     Hospital Course Summary:   - Pt was in the hospital from 1/20-1/29. He initially presented with worsening SOB, LE edema. He was not taking his medications.   - While in the hospital the patient had an episode of V-tach that spontaneously resolved.   - He was given a life vest in the hospital  - Stress Test (Jan 2020): Intermediate. Partially reversible proximal to mid anterior wall perfusion defect. The defect demonstrates predominatly edge recoery and suggest scar with mild peri- infarct ischemia. Additionally, a fixed inferior wall perfusion defect suggestiing scar versus severe diaphragmatic attenuation. LV systolic function is severly globally reduced with an EF: 22%.   - TEE/ Cardioversion: 05/27/2018  - TEE (1/29): LV cavity size is moderately increased. LV EF: 15-20%. Left atrial appendage is severely increased in size. No thrombus.   - Iron, TSH, thiamine, B12- normal     Events and changes since last visit:    06/04/2018 PCP started pt back on remeron by his PCP- pt is unsure if he is taking it.    Pt has been wearing his life vest, but started having issues with it since Saturday- he contacted the company and they have not been out to see him.    Pt reports that he has been doing well since leaving the hospital. He has no new complaints.    He has been  eating more since leaving the hospital - he reports that he was not eating well in the hospital due to not liking the food.    Pt has been trying to avoid salt in his diet.    No swelling in ankles, no orthopnea.    Pt was splitting wood last week- did not get excessively short of breath. No chest pain.    No black stools no falls.      The patient denies chest pain, worsening DIB ( difficulty in breathing), palpitations or syncope. No orthopnea or paroxysmal dyspnea (PND). No new symptoms suggestive of TIA or stroke. No symptoms suggestive of claudication.     Cardiac medications: The patient is compliant   Salt restriction: The patient is compliant   The patient's functional capacity: Able to perform daily activities with no CV restriction   Weight changes since last visit: +25lbs. Pt reports that he was not eating hardly any food in the hospital.     The patient does not use drugs.    Pt quit smoking when he went to the hospital. The pt was previously smoking 1/2 PPD.     The patient reports that he drinks alcohol 2x a week. Previously he was drinking only 1-2 a week- per Dr. Mindi Junker- Elwyn Reach note the pt may drink more then he admits.     Review of Systems     CONSTITUTIONAL: There is no history of fever, weight loss or cough.  DERMATOLOGIC: The patient denies any lesions or rashes.  ENT: No history of congestion, postnasal drip, sore throat or hearing changes.  RESPIRATORY: See HPI   CARDIOVASCULAR: See HPI   GASTROINTESTINAL: No history of nausea, vomiting, diarrhea or abdominal pain.  GENITOURINARY: No history of dysuria or polyuria   MUSCULOSKELETAL: No significant muscle weakness   NEUROLOGICAL: Currently, no headache.numbness or tingling.  PSYCHIATRIC: No apparent significant depression     Past Medical History     PMH 06/29/2018 KW/GM  Hospital 03/02/18-03/04/18: Afib w/  RVR. Cardiomyopathy.  Hospital, 05/18/18- 05/27/17: Afib, CHF, CM, placement of life vest    1- Atrial Fibrillation.   a. Patient was on  Coumadin but changed to Eliquis due to history of noncompliance.   b. Echo, 03/2017: Global left ventricular systolic function is severely decreased. Estimated EF 10-15%. Global hypokinesis with base contracting little better. The right ventricular systolic function is mildly decreased. The left atrium is moderately enlarged. There is spontaneous echo contrast present in the left atrial cavity. Mobile thrombus present in the left atrial appendage. Spontaneous echo contrast seen in the left atrial appendage.   c. Echo, 03/03/2018: The left ventricle is moderate to severely dilated at 6.3 cm. Global hypokinesis with left ventricular ejection fraction severely decreased, estimated at 10-15%. The right ventricular size is mildy increased. The right ventricular systolic function is mildly decreased. The left atrium is moderately enlarged. The right atrium is mildly enlarged. There is mild aortic dilatation at the level of the sinuses of Valsalva. Normal respiratory variation of the inferior vena cava is present. No evidence of an atrial septal defect by color doppler. Early closure of the mitral valve suggesting elevated left ventricular end-diastolic pressure.   d. TEE, 05/27/2018: The left ventricular cavity size is moderately increased. The left ventricular ejection fraction is severely decreased, estimated at 15- 20%. The left atrial appendage is severely increased in size. There is spontaneous echo contrast present in the left atrial appendage, no thrombus formation seen.          2- NICM, Systolic HF.   a. EF 10-15% on Echo November 2019.   b. THC, 04/2018: Probable nonischemic cardiomyopathy with degree of LV function out of proportion to coronary artery disease.  Etiology could be hypertensive, alcoholic, or tachycardia induced. Borderline LAD disease. Mild pulmonary hypertension  c. EF 15-20% by TEE February 2020.       3- COPD.     4- Hypertension.   5- Left Atrial thrombus, secondary to afib. 03/2017.  6-  Remeron was started but discontinued as patient also on amiodarone and because prolongation of QT interval  7- NSVT, Life vest after hospitalization 04/2018.     8- Non-obstructive CAD.   a. Nuclear Stress, 05/21/18: partially reversible proximal to mid anterior wall perfusion defect. The defect demonstrates predominantly edge recovery and suggest scar with mild peri-infarct ischemia.  There is additionally a fixed inferior wall perfusion defect suggesting scar versus severe diaphragmatic attenuation.  LV systolic function is severely globally reduced with a calculated ejection function of 22%.  Summed difference score is 5.    Past Surgical History     Past Surgical History:   Procedure Laterality Date   . APPENDECTOMY     . COLONOSCOPY, POLYPECTOMY  12/16/2012    Procedure: COLONOSCOPY, POLYPECTOMY;  Surgeon: Gwenith Spitz, MD;  Location: [facility];  Service: Gastroenterology;  Laterality: N/A;   . EGD, BIOPSY  12/16/2012    Procedure: EGD, BIOPSY;  Surgeon: Gwenith Spitz, MD;  Location: [facility];  Service: Gastroenterology;  Laterality: N/A;   . KNEE ARTHROSCOPY W/ ACL RECONSTRUCTION     . LIFT, ARM (MEDICAL)     . RIGHT & LEFT HEART CATH N/A 05/20/2018    Procedure: RIGHT & LEFT HEART CATH;  Surgeon: Rudean Curt, MD;  [facility] C[facility]EP;  Service: Cardiovascular;  Laterality: N/A;  total heart cath 05/20/18       Family History     History reviewed. No pertinent family history.  Social History     Social History     Tobacco Use   . Smoking status: Current Every Day Smoker     Packs/day: 0.50     Types: Cigarettes   . Smokeless tobacco: Never Used   Substance Use Topics   . Alcohol use: Yes     Comment: occasional   . Drug use: No       Allergies     Allergies   Allergen Reactions   . Codeine        Medications     Current Outpatient Medications   Medication Sig   . albuterol (PROVENTIL HFA;VENTOLIN HFA) 108 (90 Base) MCG/ACT inhaler Inhale 2 puffs into the lungs every 6 (six) hours as  needed (wheezing)   . amiodarone (PACERONE) 200 MG tablet Take 200 mg by mouth daily   . furosemide (LASIX) 40 MG tablet Take 40 mg by mouth daily   . mirtazapine (REMERON) 7.5 MG tablet Take 7.5 mg by mouth daily   . XARELTO 15 MG Tab Take 15 mg by mouth daily         Physical Exam     Vitals:    06/29/18 1035   BP: (!) 122/92   Pulse: (!) 58     Body mass index is 28.89 kg/m.  Wt Readings from Last 3 Encounters:   06/29/18 96.6 kg (213 lb)   05/27/18 85.4 kg (188 lb 4.4 oz)   03/04/18 90.6 kg (199 lb 11.8 oz)        General appearance - alert, well appearing, and in no distress  Head: normocephalic, atraumatic  Mental status -  Alert and oriented,   Eyes - extraocular eye movements intact  Neck -  Supple, no thyromegaly  Chest - Clear to auscultation bilaterally      Cardiovascular system -   Regular rate and regular rhythm  +ve S1-S2  No gallop murmur or rubs  +2 pulses in the posterior tibial/ dorsalis pedis bilaterally    Neurological - alert, oriented x3, no obvious gross neurological deficits  Extremities -  no clubbing or cyanosis.  No edema bilateral  Psych- appropriate affect    Labs     Lab Results   Component Value Date/Time    WBC 6.7 05/26/2018 10:33 AM    RBC 5.37 05/26/2018 10:33 AM    HGB 16.8 05/26/2018 10:33 AM    HCT 50.4 05/26/2018 10:33 AM    PLT 186 05/26/2018 10:33 AM    TSH 5.03 (H) 05/27/2018 08:49 AM        Lab Results   Component Value Date/Time    NA 137 05/27/2018 04:42 AM    K 4.4 05/27/2018 04:42 AM    CL 103 05/27/2018 04:42 AM    CO2 21 05/27/2018 04:42 AM    GLU 103 (H) 05/27/2018 04:42 AM    BUN 32 (H) 05/27/2018 04:42 AM    CREAT 1.68 (H) 05/27/2018 04:42 AM    PROT 5.9 (L) 05/23/2018 03:01 AM    ALKPHOS 80 05/23/2018 03:01 AM    AST 22 05/23/2018 03:01 AM    ALT 28 05/23/2018 03:01 AM       No results found for: CHOL, TRIG, HDL, LDL    No results found for: HGBA1CPERCNT    Cardiac testing and imaging:      EKG (06/29/2018): Sinus bradycardia, rate 58. Nonspecific ST- T wave  changes. Prolonged PR interval (PRI: 206). Non- specific IVCD. QT: 484, QRS: 116.  Echocardiogram November 2019: The left ventricle is moderately to severely dilated at 6.3 cm the systolic function is severely depressed with an estimated EF = 10-15 %.  Left atrium is moderately dilated.  Mild tricuspid regurgitation    Stress Test (Jan 2020): Intermediate. Partially reversible proximal to mid anterior wall perfusion defect. The defect demonstrates predominatly edge recoery and suggest scar with mild peri- infarct ischemia. Additionally, a fixed inferior wall perfusion defect suggestiing scar versus severe diaphragmatic attenuation. LV systolic function is severly globally reduced with an EF: 22%.    CATH (04/2018):   1.  Probable nonischemic cardiomyopathy with degree of LV function out of proportion to coronary artery disease.  Etiology could be hypertensive, alcoholic, or tachycardia induced.  2.  Borderline LAD disease  3.  Mild pulmonary hypertension

## 2018-06-29 ENCOUNTER — Encounter: Payer: Self-pay | Admitting: Physician Assistant

## 2018-06-29 ENCOUNTER — Ambulatory Visit: Payer: Medicare PPO | Admitting: Physician Assistant

## 2018-06-29 VITALS — BP 122/92 | HR 58 | Ht 72.0 in | Wt 213.0 lb

## 2018-06-29 DIAGNOSIS — I5023 Acute on chronic systolic (congestive) heart failure: Secondary | ICD-10-CM

## 2018-06-29 DIAGNOSIS — I480 Paroxysmal atrial fibrillation: Secondary | ICD-10-CM

## 2018-06-29 DIAGNOSIS — E7849 Other hyperlipidemia: Secondary | ICD-10-CM

## 2018-06-29 MED ORDER — METOPROLOL SUCCINATE ER 25 MG PO TB24
25.00 mg | ORAL_TABLET | Freq: Every day | ORAL | 3 refills | Status: DC
Start: 2018-06-29 — End: 2018-07-29

## 2018-06-29 NOTE — Patient Instructions (Signed)
-   Start 25mg  of Toprol   - Blood work today

## 2018-06-30 ENCOUNTER — Telehealth: Payer: Self-pay

## 2018-06-30 NOTE — Telephone Encounter (Addendum)
LM for patient to call office. Please verify if patient is taking medication when he calls back.     ----- Message from Barron Alvine, [place] sent at 06/29/2018  5:10 PM EST -----  Please call and see if he is taking remeron - His PCP restarted it recently but he was not sure if he was taking it.

## 2018-07-22 ENCOUNTER — Telehealth: Payer: Self-pay

## 2018-07-22 NOTE — Telephone Encounter (Signed)
**  PCP was contacted/Fax for recent  office note and labs, cardiac testing with tracing.

## 2018-07-23 ENCOUNTER — Telehealth: Payer: Self-pay

## 2018-07-23 NOTE — Telephone Encounter (Signed)
Sending 2nd request for recent records

## 2018-07-23 NOTE — Telephone Encounter (Signed)
Called patient to screen for his testing scheduled on Monday. Had to leave a message requesting return call.

## 2018-07-24 ENCOUNTER — Telehealth: Payer: Self-pay

## 2018-07-24 NOTE — Telephone Encounter (Signed)
Called and confirmed telehealth on Monday 07/27/2018.     Pt asked what he should do with the life vest the hospital put on him. I stated he needs to talk to Dr. Mindi Junker on Monday when we call.     No further questions.     Simeon Craft CNA

## 2018-07-24 NOTE — Telephone Encounter (Signed)
Attempted to contact patient. Left message to return call. Please screen.

## 2018-07-27 ENCOUNTER — Ambulatory Visit: Payer: Medicare PPO

## 2018-07-27 ENCOUNTER — Encounter: Payer: Medicare PPO | Admitting: Cardiovascular Disease

## 2018-07-27 NOTE — Progress Notes (Deleted)
Cardiovascular Imaging [facility]    Complete Echo    Patient name: Dorrell Mitcheltree [id number]   Gender: male    Test Date: 07/27/18  DOB: [birth year]    Age: 73 y.o.    BP:         Height:  6/0          Weight:    213   lbs  Indication: CHF    Ordering Physician: Raynald Kemp            Primary: Gerilyn Pilgrim, MD    Sonographer: April Manson      Risk Factors:  HTN   PHTN   HPL   Dyslipidemia   CP   CAD/CABG/STENT   NSTEMI   Prior MI     Tachy/Brady   CKD   Murmur   GERD   SOB/DOE/COPD/Asthma/OSA  Edema    Fatigue   Anemia   Hypothyroid   SSS   AFIB/flutter   SVT/VT   PVCS/PAC    Near/ Syncope  Palpitations  ABN EKG  RBBB/LBBB/CHB  Dialted AO Root    AAA w/Repair  Thoracic Ao W/repair  ASD/ASD Repair VSD/ VSD Repair  PFO    Tetralogy of Fallot  Ebstein's  Marfan's  Ross Procedure  Obese  Diabetes    Former/Smoker/Tobacco use    Effusion: Pleural/Pericardial   Heart Failure: Diastolic/systolic/Unspecified    Devices:  Pacemaker/Defibrillator  Cardiomyopathy: Ischemic  NICM  Dilated   Hypertrophic/HOCM  Mitral Valve:  MV Repair  MV replacement MV Clip/Ring  MR MS  MVP  Tricuspid Valve: TV Repair   TV Replacement    Tricuspid Regurgitation      Aortic Valve: AS        AI         BIC. AV    Aortic Valve Repair    s/p TAVR   AVR  Pulmonic Valve:    PI     PS

## 2018-07-28 ENCOUNTER — Other Ambulatory Visit: Payer: Self-pay | Admitting: Cardiovascular Disease

## 2018-07-28 ENCOUNTER — Telehealth: Payer: Self-pay | Admitting: Cardiovascular Disease

## 2018-07-28 ENCOUNTER — Telehealth: Payer: Self-pay

## 2018-07-28 NOTE — Telephone Encounter (Signed)
Called patient updated number to reschedule telehealth appointment. No answer, and unable to leave voicemail as phone rang busy.

## 2018-07-28 NOTE — Telephone Encounter (Signed)
I called the number on file in snapshot yesterday multiple times and never got an answer. Will try other numbers listed tomorrow for phone visit.   ASandonato LPN

## 2018-07-28 NOTE — Telephone Encounter (Signed)
Patients daughter called in upset, states that noone from our office contacted this pt for this appt yesterday. Tried to call her back, no answer, VM left      [facility] please call    Ahill PN

## 2018-07-28 NOTE — Telephone Encounter (Signed)
Attempted to call patient's daughter. No answer at this time. Please place patient on schedule for tomorrow.

## 2018-07-28 NOTE — Telephone Encounter (Signed)
Dr. Monte Fantasia,     Did you call this patient yesterday? He was set up for a Telehealth visit, but is stating no one called him.

## 2018-07-28 NOTE — Telephone Encounter (Signed)
Patient has been rescheduled for tomorrow at 11 am and all phone numbers have been update.

## 2018-07-28 NOTE — Telephone Encounter (Signed)
Most recent PCP records scanned to chart.

## 2018-07-28 NOTE — Telephone Encounter (Signed)
Patient left message no one called him for his phone visit, please put on reschedule list, phone number [phone]

## 2018-07-28 NOTE — Telephone Encounter (Signed)
Daughter Tresa Endo called, please reschedule appt with PA or Dr Monte Fantasia as Telehealth visit first openings as patient has life vest, call her at [phone]

## 2018-07-29 ENCOUNTER — Telehealth: Payer: Medicare PPO | Admitting: Cardiovascular Disease

## 2018-07-29 ENCOUNTER — Encounter: Payer: Self-pay | Admitting: Cardiovascular Disease

## 2018-07-29 VITALS — BP 167/95 | HR 70

## 2018-07-29 DIAGNOSIS — Z7901 Long term (current) use of anticoagulants: Secondary | ICD-10-CM

## 2018-07-29 DIAGNOSIS — I480 Paroxysmal atrial fibrillation: Secondary | ICD-10-CM

## 2018-07-29 DIAGNOSIS — I10 Essential (primary) hypertension: Secondary | ICD-10-CM

## 2018-07-29 DIAGNOSIS — I519 Heart disease, unspecified: Secondary | ICD-10-CM

## 2018-07-29 MED ORDER — METOPROLOL TARTRATE 25 MG PO TABS
25.0000 mg | ORAL_TABLET | Freq: Two times a day (BID) | ORAL | 1 refills | Status: DC
Start: 2018-07-29 — End: 2018-08-14

## 2018-07-29 NOTE — Progress Notes (Signed)
[facility]                                                                                                                  Patient Name: Christopher Chock Sr.   Date of Birth: Feb 20, 1946  Age: 73 y.o.  Gender: male    Patient Care Team:  Gerilyn Pilgrim, MD as PCP - General (Family Medicine)  Glynis Smiles, FNP as Nurse Practitioner (Family Nurse Practitioner)    Chief Complaint: Virtual (Telehealth)      History of Present Illness   Christopher Kattner Sr. is a 73 y.o. male with a past medical history significant for HTN, HLD, paroxysmal atrial fibrillation, nonobstructive CAD, nonischemic cardiomyopathy (EF 20%), smoking and COPD.  This is a follow-up visit.    I initially evaluated the patient back in 2018 at the hospital however the patient lost follow-up after that-he had known persistent atrial fibrillation at that time and I wanted to cardiovert him due to his low EF however this was aborted due to the presence of a left atrial appendage thrombus.  Patient came back in 2019 and again in January 2020.  Patient underwent cardiac catheterization that showed nonobstructive CAD.  Had the patient cardioverted and an EKG in January 29 and again March 2020 showed normal sinus rhythm.    The patient has been feeling well since his discharge.    The patient denies chest pain, worsening DIB ( difficulty in breathing), palpitations or syncope. No orthopnea or paroxysmal dyspnea (PND). No new symptoms suggestive of TIA or stroke. No symptoms suggestive of claudication.     No Hx of premature CAD or sudden cardiac death in the first degree relatives.    Review of Systems     CONSTITUTIONAL: There is no history of fever, weight loss or cough.  DERMATOLOGIC: The patient denies any lesions or rashes.  ENT: No history of congestion, postnasal drip, sore throat or hearing changes.  RESPIRATORY: See HPI   CARDIOVASCULAR: See  HPI   GASTROINTESTINAL: No history of nausea, vomiting, diarrhea or abdominal pain.  GENITOURINARY: No history of dysuria or polyuria   MUSCULOSKELETAL: No significant muscle weakness   ENDOCRINE: She denies any heat or cold intolerance or excessive thirst or urination.  NEUROLOGICAL: Currently, no headache. She has no vision changes, dizziness or fainting. No numbness or tingling.  PSYCHIATRIC: No apparent significant depression     Past Medical History     07/27/2018    1. Atrial Fibrillation.   2. NICM, Systolic HF.  3. COPD  4. Hypertension.   5. Left Atrial thrombus, secondary to afib  6. NSVT, Life vest after hospitalization 04/2018.   7. Non-obstructive CAD.     Past Surgical History     Past Surgical History:   Procedure Laterality Date    APPENDECTOMY      COLONOSCOPY, POLYPECTOMY  12/16/2012    Procedure: COLONOSCOPY, POLYPECTOMY;  Surgeon: Gwenith Spitz, MD;  Location: [facility];  Service: Gastroenterology;  Laterality: N/A;    EGD, BIOPSY  12/16/2012    Procedure: EGD, BIOPSY;  Surgeon: Gwenith Spitz, MD;  Location: [facility];  Service: Gastroenterology;  Laterality: N/A;    KNEE ARTHROSCOPY W/ ACL RECONSTRUCTION      LIFT, ARM (MEDICAL)      RIGHT & LEFT HEART CATH N/A 05/20/2018    Procedure: RIGHT & LEFT HEART CATH;  Surgeon: Rudean Curt, MD;  Location[facility] CATH/EP;  Service: Cardiovascular;  Laterality: N/A;  total heart cath 05/20/18       Family History     History reviewed. No pertinent family history.    Social History     Social History     Tobacco Use    Smoking status: Current Every Day Smoker     Packs/day: 0.50     Types: Cigarettes    Smokeless tobacco: Never Used   Substance Use Topics    Alcohol use: Yes     Comment: occasional    Drug use: No       Allergies     Allergies   Allergen Reactions    Codeine        Medications     Current Outpatient Medications   Medication Sig    albuterol (PROVENTIL HFA;VENTOLIN HFA) 108 (90 Base) MCG/ACT inhaler Inhale 2  puffs into the lungs every 6 (six) hours as needed (wheezing)    amiodarone (PACERONE) 200 MG tablet Take 200 mg by mouth daily    furosemide (LASIX) 40 MG tablet Take 40 mg by mouth daily    mirtazapine (REMERON) 7.5 MG tablet Take 7.5 mg by mouth daily    XARELTO 15 MG Tab Take 15 mg by mouth daily    metoprolol tartrate (LOPRESSOR) 25 MG tablet Take 1 tablet (25 mg total) by mouth 2 (two) times daily         Physical Exam     Vitals:    07/29/18 1149   BP: (!) 167/95   Pulse: 70     There is no height or weight on file to calculate BMI.  Wt Readings from Last 3 Encounters:   06/29/18 96.6 kg (213 lb)   05/27/18 85.4 kg (188 lb 4.4 oz)   03/04/18 90.6 kg (199 lb 11.8 oz)          Labs     Lab Results   Component Value Date/Time    WBC 6.7 05/26/2018 10:33 AM    RBC 5.37 05/26/2018 10:33 AM    HGB 16.8 05/26/2018 10:33 AM    HCT 50.4 05/26/2018 10:33 AM    PLT 186 05/26/2018 10:33 AM    TSH 5.03 (H) 05/27/2018 08:49 AM        Lab Results   Component Value Date/Time    NA 137 05/27/2018 04:42 AM    K 4.4 05/27/2018 04:42 AM    CL 103 05/27/2018 04:42 AM    CO2 21 05/27/2018 04:42 AM    GLU 103 (H) 05/27/2018 04:42 AM    BUN 32 (H) 05/27/2018 04:42 AM    PROT 5.9 (L) 05/23/2018 03:01 AM    ALKPHOS 80 05/23/2018 03:01 AM    AST 22 05/23/2018 03:01 AM    ALT 28 05/23/2018 03:01 AM       No results found for: CHOL, TRIG, HDL, LDL    No results found for: HGBA1CPERCNT    Cardiac testing and imaging:    TEE December 2018-the global left ventricular systolic function  is severely depressed with an EF of 10 to 15%.  The right ventricle systolic function is mildly decreased.  The left atrium is moderately dilated.  There is suspicion of a mobile thrombus present in the left atrial appendage.  No significant valvular abnormalities detected    Echocardiogram November 2019-the left ventricle is moderately dilated.  The systolic function is severely depressed with an EF of 10 to 15%.  The right ventricle is mildly dilated  with mildly reduced systolic function.  The left atrium is moderately dilated.  Aortic valve sclerosis is with no stenosis.  No other significant valvular functional abnormalities    TEE in January 2020-the left ventricle is moderately dilated.  EF 15 to 20%.  No thrombus detected in the left atrial appendage    Cardiac catheterization January 2020-nonobstructive CAD.50% and 60 in the proximal mid LAD.  50% in the diagonal branch.  30% in the RCA.  20% in the PDA  Diagnosis      1. Systolic dysfunction  Office - Echocardiogram [employer] [facility] [employer]   2. Paroxysmal atrial fibrillation     3. Long term current use of anticoagulant therapy     4. Essential hypertension          We discussed the diagnosis and the prognosis of atrial fibrillation We discussed the signs and symptoms of bleeding and a stroke and I asked the patient to seek medical attention if either of them are suspected.    Plan:     Due to the unusual circumstances and the restrictions associated with COVID -19, the patient opted for a virtual visit and a phone call.  I spent 30 minutes with the patient going through the history since the last visit (including any symptoms or recent hospitalization).  The patient was not sure of all his medications so I called him again 2 hours later.  I asked the patient to measure his blood pressure and documented as above.  The patient has been feeling much better since we cardioverted him to sinus rhythm.  I recommend the following:      1. Change metoprolol to 25 mg twice a day  2. Continue on amiodarone  3. Continue on Lasix  4. Continue on Xarelto  5. Limited echocardiogram to reevaluate the systolic function and reevaluate the need for the LifeVest.  The patient has been complaining about it.      Follow up at the office in 4 weeks       Thank you kindly for referring this patient.     Respectfully,     Electronically signed by:   Harlin Rain, M.D.   [facility]   Phone: [phone]  Fax: [phone]

## 2018-08-03 ENCOUNTER — Telehealth: Payer: Self-pay | Admitting: Cardiovascular Disease

## 2018-08-03 NOTE — Telephone Encounter (Signed)
Attempted to reach patient on only phone number list. No answer, phone rings busy and unable to leave a voicemail. Per Dr. Jenetta Downer most recent office note on 07/29/18 patient needs a limited Echo and a one month follow up afterwards. 08/03/18 ANP.

## 2018-08-06 NOTE — Progress Notes (Signed)
Updated [employer] forms faxed.

## 2018-08-12 ENCOUNTER — Telehealth: Payer: Self-pay | Admitting: Cardiovascular Disease

## 2018-08-12 NOTE — Telephone Encounter (Signed)
LVM for patient to call office back. Per Dr. Monte Fantasia patient needs an Echo scheduled ASAP with a 4 week follow up. Lowanda Foster, Patient Access Specialist.

## 2018-08-14 ENCOUNTER — Other Ambulatory Visit: Payer: Self-pay | Admitting: Cardiovascular Disease

## 2018-08-14 MED ORDER — METOPROLOL TARTRATE 25 MG PO TABS
25.0000 mg | ORAL_TABLET | Freq: Two times a day (BID) | ORAL | 3 refills | Status: DC
Start: 2018-08-14 — End: 2018-09-24

## 2018-08-25 ENCOUNTER — Telehealth: Payer: Self-pay

## 2018-08-25 NOTE — Telephone Encounter (Signed)
Chris from [employer] life vest called in, [facility] is requesting updated clinical documentation (last ov note/s) and a treatment plan form that needs signed by the Dr. She will fax this over to [place] via Medical Records    Medical Records- please forward ppw to [facility] phone number: [phone]  Fax: [phone]    Ahill PN

## 2018-08-28 NOTE — Telephone Encounter (Signed)
I have not

## 2018-08-28 NOTE — Telephone Encounter (Signed)
I have not received anything from [facility] in regards to this -- Jeanice Lim, have you?

## 2018-09-02 NOTE — Telephone Encounter (Signed)
As stated in the note, we need to get a limited echo before we decide to discontinue it or proceed with ICD. Lets schedule the echo ASAP and if there is any papers to sign for the lifevest, u can bring it to me

## 2018-09-02 NOTE — Telephone Encounter (Signed)
[facility], can you have Dr. Monte Dickerson take care of this patient's treatment plan in regards to the [employer] Life Vest?  It looks like Dr. Manson Passey consulted the patient and ordered it in the hospital but he has never seen Dr. Manson Passey in office, so I do not have a treatment plan from Dr. Theora Gianotti standpoint that I can fax over. I will forward the [employer] paperwork to you. The patient just had an appt with Dr. Monte Dickerson last month and saw Barron Alvine previously. Dr. Manson Passey will also not be in the office for the next 2 weeks. Let me know if Dr. Monte Dickerson is OK with that. Thank you!!

## 2018-09-02 NOTE — Telephone Encounter (Signed)
Please advise 

## 2018-09-03 NOTE — Telephone Encounter (Signed)
Thank you, ma'am!

## 2018-09-03 NOTE — Telephone Encounter (Signed)
Patient has been scheduled for early next.

## 2018-09-03 NOTE — Telephone Encounter (Signed)
Morrie Sheldon, can we please schedule the patient for a limited echo?     Thank you,    [facility]

## 2018-09-04 NOTE — Addendum Note (Signed)
Addended by: Vernell Leep on: 09/04/2018 12:44 PM     Modules accepted: Level of Service

## 2018-09-07 NOTE — Progress Notes (Deleted)
Cardiovascular Imaging [facility]    echo    Patient name: Christopher Dickerson [id number]   Gender: male    Test Date: 09/08/18  DOB: [birth year]    Age: 73 y.o.    BP:         Height:  6'          Weight:  213     lbs  Indication: Systolic Dysfunction    Ordering Physician: Barron Alvine PA, Al-Najafi            Primary: Gerilyn Pilgrim, MD    Sonographer: Merita Norton      Risk Factors:  HTN   PHTN   HPL   Dyslipidemia   CP   CAD/CABG/STENT   NSTEMI   Prior MI     Tachy/Brady   CKD   Murmur   GERD   SOB/DOE/COPD/Asthma/OSA  Edema    Fatigue   Anemia   Hypothyroid   SSS   AFIB/flutter   SVT/VT   PVCS/PAC    Near/ Syncope  Palpitations  ABN EKG  RBBB/LBBB/CHB  Dialted AO Root    AAA w/Repair  Thoracic Ao W/repair  ASD/ASD Repair VSD/ VSD Repair  PFO    Tetralogy of Fallot  Ebstein's  Marfan's  Ross Procedure  Obese  Diabetes    Former/Smoker/Tobacco use    Effusion: Pleural/Pericardial   Heart Failure: Diastolic/systolic/Unspecified    Devices:  Pacemaker/Defibrillator  Cardiomyopathy: Ischemic  NICM  Dilated   Hypertrophic/HOCM  Mitral Valve:  MV Repair  MV replacement MV Clip/Ring  MR MS  MVP  Tricuspid Valve: TV Repair   TV Replacement    Tricuspid Regurgitation      Aortic Valve: AS        AI         BIC. AV    Aortic Valve Repair    s/p TAVR   AVR  Pulmonic Valve:    PI     PS

## 2018-09-08 ENCOUNTER — Other Ambulatory Visit: Payer: Medicare PPO

## 2018-09-14 ENCOUNTER — Emergency Department: Payer: Medicare PPO

## 2018-09-14 ENCOUNTER — Emergency Department
Admission: EM | Admit: 2018-09-14 | Discharge: 2018-09-14 | Disposition: A | Discharge status: Home / Self Care | Payer: Medicare PPO | Attending: Emergency Medicine | Admitting: Emergency Medicine

## 2018-09-14 DIAGNOSIS — M7121 Synovial cyst of popliteal space [Baker], right knee: Secondary | ICD-10-CM | POA: Insufficient documentation

## 2018-09-14 DIAGNOSIS — M7989 Other specified soft tissue disorders: Secondary | ICD-10-CM

## 2018-09-14 LAB — CBC AND DIFFERENTIAL
Basophils %: 0.6 % (ref 0.0–3.0)
Basophils Absolute: 0.1 10*3/uL (ref 0.0–0.3)
Eosinophils %: 3 % (ref 0.0–7.0)
Eosinophils Absolute: 0.3 10*3/uL (ref 0.0–0.8)
Hematocrit: 42.1 % (ref 39.0–52.5)
Hemoglobin: 14.2 gm/dL (ref 13.0–17.5)
Lymphocytes Absolute: 1.3 10*3/uL (ref 0.6–5.1)
Lymphocytes: 13.4 % — ABNORMAL LOW (ref 15.0–46.0)
MCH: 32 pg (ref 28–35)
MCHC: 34 gm/dL (ref 32–36)
MCV: 96 fL (ref 80–100)
MPV: 7.3 fL (ref 6.0–10.0)
Monocytes Absolute: 0.9 10*3/uL (ref 0.1–1.7)
Monocytes: 9.3 % (ref 3.0–15.0)
Neutrophils %: 73.8 % (ref 42.0–78.0)
Neutrophils Absolute: 7.1 10*3/uL (ref 1.7–8.6)
PLT CT: 229 10*3/uL (ref 130–440)
RBC: 4.38 10*6/uL (ref 4.00–5.70)
RDW: 13.4 % (ref 11.0–14.0)
WBC: 9.6 10*3/uL (ref 4.0–11.0)

## 2018-09-14 LAB — BASIC METABOLIC PANEL
Anion Gap: 13.3 mMol/L (ref 7.0–18.0)
BUN / Creatinine Ratio: 18.4 Ratio (ref 10.0–30.0)
BUN: 29 mg/dL — ABNORMAL HIGH (ref 7–22)
CO2: 27 mMol/L (ref 20–30)
Calcium: 8.9 mg/dL (ref 8.5–10.5)
Chloride: 104 mMol/L (ref 98–110)
Creatinine: 1.58 mg/dL — ABNORMAL HIGH (ref 0.80–1.30)
EGFR: 43 mL/min/{1.73_m2} — ABNORMAL LOW (ref 60–150)
Glucose: 112 mg/dL — ABNORMAL HIGH (ref 71–99)
Osmolality Calculated: 286 mOsm/kg (ref 275–300)
Potassium: 4.3 mMol/L (ref 3.5–5.3)
Sodium: 140 mMol/L (ref 136–147)

## 2018-09-14 LAB — B-TYPE NATRIURETIC PEPTIDE: B-Natriuretic Peptide: 188.4 pg/mL — ABNORMAL HIGH (ref 0.0–100.0)

## 2018-09-14 MED ORDER — MEDICAL COMPRESSION THIGH HIGH MISC
1.00 | Freq: Once | 1 refills | Status: AC
Start: 2018-09-14 — End: 2018-09-14

## 2018-09-14 NOTE — Discharge Instructions (Signed)
Baker's Cyst    You have a Baker's cyst. This is a lump or bulge in the back of your knee. It is caused when extra joint fluid flows into a small sac behind the knee. The extra fluid occurs because arthritis or a torn cartilage irritates the knee joint.  A small Baker's cyst often has no symptoms. A larger cyst can cause some knee pain or a feeling of pressure behind your knee when you try to fully straighten or bend that joint. A Baker's cyst can leak, leading fluid to move down into your lower leg. This causes swelling, pain, and redness.  Treatment may involve draining the extra fluid. Or medicine may be injected to reduce redness and swelling. If the extra fluid is caused by a torn cartilage, then surgery to repair the cartilage may be the best treatment option. If arthritis is the cause, and it does not get better with treatment, surgery may need to address the arthritis and cyst.  Home care   If you have knee pain, stay off the affected leg as much as possible until the pain eases.   Apply an ice pack to the painful area forno more than20 minutes. Do this every3 to 6hoursfor the first24 to 48 hours. Keep using ice packs 3 to 4 times a day for the next few days,as needed for pain. To make an ice pack, put ice cubes in a sealed zip-lock plasticbag. Wrap the bag in aclean, thintowel or cloth. Never put ice or an ice pack directly on the skin.   If you were given ahook-and-loopknee brace, you may open the brace to apply ice. Unless told otherwise, you may remove the brace to bathe and sleep.   You may useover-the-counter pain medicine to control pain, unless another medicine was prescribed. Talk with your provider before using these medicines if you have chronic liver or kidney disease, or have ever had a stomach ulcer or gastrointestinal bleeding.    If crutches or a walker have been recommended, don't bear full weight on your injured leg until you can do so without pain. Check with your  provider before returning to sports or full work duties.    Follow-up care  Follow up with your healthcare provider within 1 to 2 weeks, or as advised.  If X-rays were taken,you will be notified of any new findings that may affect your care.  When to seek medical advice  Call your healthcare provider right away if any of these occur:   Toes or foot become swollen, cold, blue, numb or tingly   Pain or swelling increases   Warmth or redness appears over the knee   Redness, swelling or pain occurs in the calf or lower leg   Fever or chills  [employer] last reviewed this educational content on 08/27/2016   2000-2020 [employer] [address], [place] [address]. All rights reserved. This information is not intended as a substitute for professional medical care. Always follow your healthcare professional's instructions.

## 2018-09-14 NOTE — ED Provider Notes (Signed)
[facility]  EMERGENCY DEPARTMENT  History and Physical Exam       Patient Name: Christopher Dickerson, Christopher SR.  Encounter Date:  09/14/2018  Physician Assistant: Boykin Peek, PA-C  Attending Physician: Bernette Redbird, MD  PCP: Gerilyn Pilgrim, MD  Patient DOB:  1946/01/13  MRN:  [id number]  Room:  E55/E55-A    History of Presenting Illness     Chief complaint: Leg Swelling    HPI/ROS given by: Patient    Christopher Fenton Sr. is a 73 y.o. male with history of atrial fibrillation, on chronic anticoagulation (Xarelto), COPD, hypertension, chronic systolic heart failure with EF 15%,who presents with pain and swelling affecting the right calf.  He awoke last Friday morning (4 days ago), with what he describes as a cramp in the right calf.  Has experienced mild persistent discomfort since then.  Today notices that the calf is more swollen.  The pain is localized and isolated to the calf, denies any radiation to the leg.  Denies any associated chest pain, shortness of breath, fever.  Denies any unusual physical activity or trauma.  States that he is compliant with his Xarelto, has been on Xarelto since January.     Review of Systems     Review of Systems   Constitutional: Negative for chills and fever.   Respiratory: Negative for shortness of breath.    Cardiovascular: Positive for leg swelling. Negative for chest pain.   Gastrointestinal: Negative for abdominal pain.   Neurological: Negative for speech difficulty.   Psychiatric/Behavioral: Negative for agitation.   All other systems reviewed and are negative.       Allergies & Medications     Pt is allergic to codeine.    Discharge Medication List as of 09/14/2018  6:51 PM      CONTINUE these medications which have NOT CHANGED    Details   amiodarone (PACERONE) 200 MG tablet Take 200 mg by mouth daily, Historical Med      metoprolol tartrate (LOPRESSOR) 25 MG tablet Take 1 tablet (25 mg total) by mouth 2 (two) times daily, Starting Fri 08/14/2018,  Normal      XARELTO 15 MG Tab Take 15 mg by mouth daily, Starting Sat 06/27/2018, Historical Med      albuterol (PROVENTIL HFA;VENTOLIN HFA) 108 (90 Base) MCG/ACT inhaler Inhale 2 puffs into the lungs every 6 (six) hours as needed (wheezing), Historical Med      furosemide (LASIX) 40 MG tablet Take 40 mg by mouth daily, Starting Sat 06/27/2018, Historical Med      mirtazapine (REMERON) 7.5 MG tablet Take 7.5 mg by mouth daily, Starting Sat 06/27/2018, Historical Med              Past Medical History     Pt has a past medical history of Arthritis, Atrial fibrillation, Cardiomyopathy, Chronic obstructive pulmonary disease, Hypertension, and 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     Past Surgical History     Pt  has a past surgical history that includes LIFT, ARM (MEDICAL); Knee arthroscopy w/ ACL reconstruction; Appendectomy; COLONOSCOPY, POLYPECTOMY (12/16/2012); EGD, BIOPSY (12/16/2012); and Right & Left Heart Cath (N/A, 05/20/2018).     Family History     The family history is not on file.     Social History     Pt reports that he has quit smoking. His smoking use included cigarettes. He smoked 0.50 packs per day. He has never used smokeless tobacco. He reports current alcohol use. He reports  that he does not use drugs.     Physical Exam     Blood pressure (!) 174/91, pulse 72, temperature 97.5 F (36.4 C), temperature source Skin, resp. rate 18, weight 114 kg, SpO2 99 %.    Constitutional: Well developed, well nourished, active, in no apparent distress.  HENT:   Head: Normocephalic, atraumatic  Ears: No external lesions.  Nose: No external lesions. No epistaxis or drainage.  Eyes: PERRL. No scleral icterus. No conjunctival injection. EOMI.  Neck: Trachea is midline. No JVD. Normal range of motion. No apparent masses.  Cardiovascular: Regular rhythm, S1 normal and S2 normal. No murmur heard.  1+ edema to the right lower extremity, trace edema to the left lower extremity.  Increased circumference to the right  calf with marketed tenderness to the proximal posterior calf and popliteal soft tissues.  No palpable cord.  No erythema or warmth.  DP pulse 2+ bilaterally, PT pulses trace bilaterally.  Motor and sensory intact.  Pulmonary/Chest: Effort normal. Lungs clear to auscultation bilaterally.   Abdominal: Soft, non-tender, non-distended. No masses.   Genitourinay/Anorectal: Defferred  Musculoskeletal: Normal range of motion of extremities. No deformity or apparent injury.   Neurological: Pt is alert. Cranial nerves are grossly intact. Moving all extremities without apparent deficit.   Psychiatric: Affect is appropriate. There is no agitation.   Skin: Skin is warm, dry, well perfused. No rash noted.      Diagnostic Results     The results of the diagnostic studies below have been reviewed by myself:    Labs  Results     Procedure Component Value Units Date/Time    B-type Natriuretic Peptide [[id number]]  (Abnormal) Collected:  09/14/18 1628    Specimen:  Blood Updated:  09/14/18 1711     B-Natriuretic Peptide 188.4 pg/mL     Basic Metabolic Panel [[id number]]  (Abnormal) Collected:  09/14/18 1628    Specimen:  Plasma Updated:  09/14/18 1703     Sodium 140 mMol/L      Potassium 4.3 mMol/L      Chloride 104 mMol/L      CO2 27 mMol/L      Calcium 8.9 mg/dL      Glucose 782 mg/dL      Creatinine 9.56 mg/dL      BUN 29 mg/dL      Anion Gap 21.3 mMol/L      BUN/Creatinine Ratio 18.4 Ratio      EGFR 43 mL/min/1.81m2      Osmolality Calculated 286 mOsm/kg     CBC and differential [[id number]]  (Abnormal) Collected:  09/14/18 1628    Specimen:  Blood Updated:  09/14/18 1656     WBC 9.6 K/cmm      RBC 4.38 M/cmm      Hemoglobin 14.2 gm/dL      Hematocrit 62.9 %      MCV 96 fL      MCH 32 pg      MCHC 34 gm/dL      RDW 52.8 %      PLT CT 229 K/cmm      MPV 7.3 fL      NEUTROPHIL % 73.8 %      Lymphocytes 13.4 %      Monocytes 9.3 %      Eosinophils % 3.0 %      Basophils % 0.6 %      Neutrophils Absolute 7.1 K/cmm      Lymphocytes  Absolute 1.3 K/cmm  Monocytes Absolute 0.9 K/cmm      Eosinophils Absolute 0.3 K/cmm      BASO Absolute 0.1 K/cmm           Radiologic Studies  US Venous Low Extrem Duplx Dopp Uni Right    Result Date: 09/14/2018  No evidence of deep vein thrombosis right leg. There is a 3.9 x 1.6 x 2.1 cm complex fluid collection at the right posterior fossa. ReadingStation:[id number]      EKG:      ED Meds     ED Medication Orders (From admission, onward)    None           ED Course and Medical Decision Making     Old medical records and nursing/triage notes were reviewed by myself    DIAGNOSTIC CONSIDERATIONS    DVT, Baker's cyst, superficial thrombophlebitis    CONSULTS    Dr. Miquel Dunn (radiology)-discussed imaging findings, states that this is radiographically consistent with popliteal cyst, but would recommend clinical correlation for infection or hematoma.    ED COURSE & MDM    Patient evaluated for acute right leg pain and swelling.  Ultrasound notable for complex fluid collection in the popliteal soft tissues.  Discussed with reading radiologist, states this would be consistent with popliteal cyst.  Clinically, there is no evidence of hematoma or infection.  Labs are notable for elevated serum creatinine which is consistent with recent values.  He is advised to avoid NSAIDs or other nephrotoxic medication.  He was given a prescription for outpatient compression stockings.  Recommend follow-up with orthopedics.  Understands to return to the ED for new or worsening pain, erythema, or any other concerns.    In addition to the above history, please see nursing notes. Allergies, meds, past medical, family, social hx, and the results of the diagnostic studies performed have been reviewed by myself.    This chart was generated by an EMR and may contain errors or omissions not intended by the user.     Procedures / Critical Care     None     Diagnosis / Disposition     Clinical Impression  1. Popliteal cyst, right    2. Right  leg swelling        Disposition  ED Disposition     ED Disposition Condition Date/Time Comment    Discharge  Mon Sep 14, 2018  6:50 PM Christopher Fenton Sr. discharge to home/self care.    Condition at disposition: Stable          Follow up for Discharged Patients  Karolee Stamps, MD  [address]  [place] [address]  [phone]    Schedule an appointment as soon as possible for a visit        Prescriptions for Discharged Patients  Discharge Medication List as of 09/14/2018  6:51 PM                  Boykin Peek, PA  09/14/18 1945       Raboff, Lum Keas, MD  09/17/18 [phone]

## 2018-09-14 NOTE — ED Triage Notes (Signed)
Pt presents to ED c/o right lower leg swelling x few days. Pt denies injury, chest pain, or shortness of breath. Denies dx DVTs. Pt is on thinners for a-fib.

## 2018-09-17 ENCOUNTER — Emergency Department: Payer: Medicare PPO

## 2018-09-17 ENCOUNTER — Telehealth: Payer: Self-pay

## 2018-09-17 ENCOUNTER — Observation Stay
Admission: EM | Admit: 2018-09-17 | Discharge: 2018-09-19 | Disposition: A | Discharge status: Home / Self Care | Payer: Medicare PPO | Attending: Family Medicine | Admitting: Family Medicine

## 2018-09-17 DIAGNOSIS — M1990 Unspecified osteoarthritis, unspecified site: Secondary | ICD-10-CM | POA: Insufficient documentation

## 2018-09-17 DIAGNOSIS — Z885 Allergy status to narcotic agent status: Secondary | ICD-10-CM | POA: Insufficient documentation

## 2018-09-17 DIAGNOSIS — S8011XA Contusion of right lower leg, initial encounter: Secondary | ICD-10-CM | POA: Insufficient documentation

## 2018-09-17 DIAGNOSIS — X58XXXA Exposure to other specified factors, initial encounter: Secondary | ICD-10-CM | POA: Insufficient documentation

## 2018-09-17 DIAGNOSIS — Z9049 Acquired absence of other specified parts of digestive tract: Secondary | ICD-10-CM | POA: Insufficient documentation

## 2018-09-17 DIAGNOSIS — E669 Obesity, unspecified: Secondary | ICD-10-CM | POA: Insufficient documentation

## 2018-09-17 DIAGNOSIS — Z79899 Other long term (current) drug therapy: Secondary | ICD-10-CM | POA: Insufficient documentation

## 2018-09-17 DIAGNOSIS — Z7901 Long term (current) use of anticoagulants: Secondary | ICD-10-CM | POA: Insufficient documentation

## 2018-09-17 DIAGNOSIS — Z66 Do not resuscitate: Secondary | ICD-10-CM | POA: Insufficient documentation

## 2018-09-17 DIAGNOSIS — S86111A Strain of other muscle(s) and tendon(s) of posterior muscle group at lower leg level, right leg, initial encounter: Principal | ICD-10-CM | POA: Insufficient documentation

## 2018-09-17 DIAGNOSIS — L03115 Cellulitis of right lower limb: Secondary | ICD-10-CM | POA: Insufficient documentation

## 2018-09-17 DIAGNOSIS — I4891 Unspecified atrial fibrillation: Secondary | ICD-10-CM | POA: Insufficient documentation

## 2018-09-17 DIAGNOSIS — J449 Chronic obstructive pulmonary disease, unspecified: Secondary | ICD-10-CM | POA: Insufficient documentation

## 2018-09-17 DIAGNOSIS — Z6831 Body mass index (BMI) 31.0-31.9, adult: Secondary | ICD-10-CM | POA: Insufficient documentation

## 2018-09-17 DIAGNOSIS — F329 Major depressive disorder, single episode, unspecified: Secondary | ICD-10-CM | POA: Insufficient documentation

## 2018-09-17 DIAGNOSIS — Z7951 Long term (current) use of inhaled steroids: Secondary | ICD-10-CM | POA: Insufficient documentation

## 2018-09-17 DIAGNOSIS — M7121 Synovial cyst of popliteal space [Baker], right knee: Secondary | ICD-10-CM | POA: Insufficient documentation

## 2018-09-17 DIAGNOSIS — I10 Essential (primary) hypertension: Secondary | ICD-10-CM | POA: Insufficient documentation

## 2018-09-17 DIAGNOSIS — Z87891 Personal history of nicotine dependence: Secondary | ICD-10-CM | POA: Insufficient documentation

## 2018-09-17 DIAGNOSIS — I429 Cardiomyopathy, unspecified: Secondary | ICD-10-CM | POA: Insufficient documentation

## 2018-09-17 LAB — CBC AND DIFFERENTIAL
Basophils %: 0.2 % (ref 0.0–3.0)
Basophils Absolute: 0 10*3/uL (ref 0.0–0.3)
Eosinophils %: 1 % (ref 0.0–7.0)
Eosinophils Absolute: 0.2 10*3/uL (ref 0.0–0.8)
Hematocrit: 38.3 % — ABNORMAL LOW (ref 39.0–52.5)
Hemoglobin: 12.7 gm/dL — ABNORMAL LOW (ref 13.0–17.5)
Lymphocytes Absolute: 1.1 10*3/uL (ref 0.6–5.1)
Lymphocytes: 6.5 % — ABNORMAL LOW (ref 15.0–46.0)
MCH: 32 pg (ref 28–35)
MCHC: 33 gm/dL (ref 32–36)
MCV: 97 fL (ref 80–100)
MPV: 7.6 fL (ref 6.0–10.0)
Monocytes Absolute: 1.1 10*3/uL (ref 0.1–1.7)
Monocytes: 6.7 % (ref 3.0–15.0)
Neutrophils %: 85.6 % — ABNORMAL HIGH (ref 42.0–78.0)
Neutrophils Absolute: 13.8 10*3/uL — ABNORMAL HIGH (ref 1.7–8.6)
PLT CT: 271 10*3/uL (ref 130–440)
RBC: 3.95 10*6/uL — ABNORMAL LOW (ref 4.00–5.70)
RDW: 13.2 % (ref 11.0–14.0)
WBC: 16.2 10*3/uL — ABNORMAL HIGH (ref 4.0–11.0)

## 2018-09-17 LAB — PROCALCITONIN: Procalcitonin: 0.12 ng/mL (ref 0.00–0.24)

## 2018-09-17 LAB — COMPREHENSIVE METABOLIC PANEL
ALT: 16 U/L (ref 0–55)
AST (SGOT): 19 U/L (ref 10–42)
Albumin/Globulin Ratio: 0.93 Ratio (ref 0.80–2.00)
Albumin: 3.8 gm/dL (ref 3.5–5.0)
Alkaline Phosphatase: 85 U/L (ref 40–145)
Anion Gap: 15.2 mMol/L (ref 7.0–18.0)
BUN / Creatinine Ratio: 15.4 Ratio (ref 10.0–30.0)
BUN: 27 mg/dL — ABNORMAL HIGH (ref 7–22)
Bilirubin, Total: 1 mg/dL (ref 0.1–1.2)
CO2: 27 mMol/L (ref 20–30)
Calcium: 8.6 mg/dL (ref 8.5–10.5)
Chloride: 102 mMol/L (ref 98–110)
Creatinine: 1.75 mg/dL — ABNORMAL HIGH (ref 0.80–1.30)
EGFR: 38 mL/min/{1.73_m2} — ABNORMAL LOW (ref 60–150)
Globulin: 4.1 gm/dL — ABNORMAL HIGH (ref 2.0–4.0)
Glucose: 166 mg/dL — ABNORMAL HIGH (ref 71–99)
Osmolality Calculated: 288 mOsm/kg (ref 275–300)
Potassium: 4.2 mMol/L (ref 3.5–5.3)
Protein, Total: 7.9 gm/dL (ref 6.0–8.3)
Sodium: 140 mMol/L (ref 136–147)

## 2018-09-17 LAB — CK: Creatine Kinase (CK): 176 U/L (ref 30–230)

## 2018-09-17 LAB — SEDIMENTATION RATE: Sed Rate: 28 mm/hr — ABNORMAL HIGH (ref 0–20)

## 2018-09-17 LAB — C-REACTIVE PROTEIN: C-Reactive Protein: 12.94 mg/dL — ABNORMAL HIGH (ref 0.02–0.80)

## 2018-09-17 MED ORDER — SODIUM CHLORIDE 0.9 % IV BOLUS
1000.00 mL | Freq: Once | INTRAVENOUS | Status: AC
Start: 2018-09-17 — End: 2018-09-17
  Administered 2018-09-17: 17:00:00 1000 mL via INTRAVENOUS

## 2018-09-17 MED ORDER — HYDROMORPHONE HCL 0.5 MG/0.5 ML IJ SOLN
0.50 mg | INTRAMUSCULAR | Status: DC | PRN
Start: 2018-09-17 — End: 2018-09-20
  Administered 2018-09-17 – 2018-09-18 (×3): 0.5 mg via INTRAVENOUS
  Filled 2018-09-17 (×2): qty 0.5

## 2018-09-17 MED ORDER — VH VANCOMYCIN THERAPY PLACEHOLDER
Status: DC
Start: 2018-09-17 — End: 2018-09-18

## 2018-09-17 MED ORDER — VANCOMYCIN HCL IN DEXTROSE 1-5 GM/200ML-% IV SOLN
1000.00 mg | Freq: Two times a day (BID) | INTRAVENOUS | Status: DC
Start: 2018-09-18 — End: 2018-09-18

## 2018-09-17 MED ORDER — SODIUM CHLORIDE (PF) 0.9 % IJ SOLN
3.00 mL | Freq: Three times a day (TID) | INTRAMUSCULAR | Status: DC
Start: 2018-09-17 — End: 2018-09-20
  Administered 2018-09-17 – 2018-09-19 (×3): 3 mL via INTRAVENOUS

## 2018-09-17 MED ORDER — HYDROCODONE-ACETAMINOPHEN 5-325 MG PO TABS
1.0000 | ORAL_TABLET | ORAL | Status: DC | PRN
Start: 2018-09-17 — End: 2018-09-20
  Administered 2018-09-17 – 2018-09-18 (×2): 2 via ORAL
  Administered 2018-09-18: 1 via ORAL
  Administered 2018-09-19 (×2): 2 via ORAL
  Filled 2018-09-17 (×4): qty 2
  Filled 2018-09-17: qty 1

## 2018-09-17 MED ORDER — HYDROMORPHONE HCL 0.5 MG/0.5 ML IJ SOLN
1.00 mg | Freq: Once | INTRAMUSCULAR | Status: AC
Start: 2018-09-17 — End: 2018-09-17
  Administered 2018-09-17: 17:00:00 1 mg via INTRAVENOUS

## 2018-09-17 MED ORDER — NALOXONE HCL 0.4 MG/ML IJ SOLN (WRAP)
0.40 mg | INTRAMUSCULAR | Status: DC | PRN
Start: 2018-09-17 — End: 2018-09-20

## 2018-09-17 MED ORDER — MIRTAZAPINE 15 MG PO TABS
7.5000 mg | ORAL_TABLET | Freq: Every evening | ORAL | Status: DC
Start: 2018-09-17 — End: 2018-09-20
  Administered 2018-09-17 – 2018-09-19 (×3): 7.5 mg via ORAL
  Filled 2018-09-17 (×4): qty 1

## 2018-09-17 MED ORDER — AMIODARONE HCL 200 MG PO TABS
200.0000 mg | ORAL_TABLET | Freq: Two times a day (BID) | ORAL | Status: DC
Start: 2018-09-17 — End: 2018-09-20
  Administered 2018-09-17 – 2018-09-19 (×5): 200 mg via ORAL
  Filled 2018-09-17 (×7): qty 1

## 2018-09-17 MED ORDER — VANCOMYCIN HCL IN DEXTROSE 1-5 GM/200ML-% IV SOLN
1000.00 mg | Freq: Once | INTRAVENOUS | Status: AC
Start: 2018-09-17 — End: 2018-09-17
  Administered 2018-09-17: 17:00:00 1000 mg via INTRAVENOUS

## 2018-09-17 MED ORDER — METOPROLOL TARTRATE 25 MG PO TABS
25.0000 mg | ORAL_TABLET | Freq: Two times a day (BID) | ORAL | Status: DC
Start: 2018-09-17 — End: 2018-09-20
  Administered 2018-09-17 – 2018-09-19 (×5): 25 mg via ORAL
  Filled 2018-09-17 (×7): qty 1

## 2018-09-17 MED ORDER — HYDROMORPHONE HCL 0.5 MG/0.5 ML IJ SOLN
INTRAMUSCULAR | Status: AC
Start: 2018-09-17 — End: ?
  Filled 2018-09-17: qty 1

## 2018-09-17 MED ORDER — ACETAMINOPHEN 650 MG RE SUPP
650.00 mg | RECTAL | Status: DC | PRN
Start: 2018-09-17 — End: 2018-09-20

## 2018-09-17 MED ORDER — ONDANSETRON HCL 4 MG/2ML IJ SOLN
INTRAMUSCULAR | Status: AC
Start: 2018-09-17 — End: ?
  Filled 2018-09-17: qty 2

## 2018-09-17 MED ORDER — VH BIO-K PLUS PROBIOTIC 50 BIL CFU CAPSULE
50.00 | DELAYED_RELEASE_CAPSULE | Freq: Every day | ORAL | Status: DC
Start: 2018-09-18 — End: 2018-09-20
  Administered 2018-09-18 – 2018-09-19 (×2): 50 via ORAL
  Filled 2018-09-17 (×2): qty 1

## 2018-09-17 MED ORDER — ONDANSETRON HCL 4 MG/2ML IJ SOLN
4.00 mg | Freq: Once | INTRAMUSCULAR | Status: AC
Start: 2018-09-17 — End: 2018-09-17
  Administered 2018-09-17: 17:00:00 4 mg via INTRAVENOUS

## 2018-09-17 MED ORDER — ACETAMINOPHEN 160 MG/5ML PO SOLN
650.00 mg | ORAL | Status: DC | PRN
Start: 2018-09-17 — End: 2018-09-20

## 2018-09-17 MED ORDER — HYDROMORPHONE HCL 0.5 MG/0.5 ML IJ SOLN
INTRAMUSCULAR | Status: AC
Start: 2018-09-17 — End: ?
  Filled 2018-09-17: qty 0.5

## 2018-09-17 MED ORDER — ACETAMINOPHEN 325 MG PO TABS
650.0000 mg | ORAL_TABLET | ORAL | Status: DC | PRN
Start: 2018-09-17 — End: 2018-09-20
  Administered 2018-09-19: 650 mg via ORAL
  Filled 2018-09-17: qty 2

## 2018-09-17 MED ORDER — AMIODARONE HCL 200 MG PO TABS
200.0000 mg | ORAL_TABLET | Freq: Two times a day (BID) | ORAL | Status: DC
Start: 2018-09-17 — End: 2018-09-17
  Filled 2018-09-17: qty 1

## 2018-09-17 MED ORDER — ALBUTEROL SULFATE (2.5 MG/3ML) 0.083% IN NEBU
2.50 mg | INHALATION_SOLUTION | Freq: Four times a day (QID) | RESPIRATORY_TRACT | Status: DC | PRN
Start: 2018-09-17 — End: 2018-09-20

## 2018-09-17 MED ORDER — VANCOMYCIN HCL IN DEXTROSE 1-5 GM/200ML-% IV SOLN
INTRAVENOUS | Status: AC
Start: 2018-09-17 — End: ?
  Filled 2018-09-17: qty 1000

## 2018-09-17 MED ORDER — GADOTERATE MEGLUMINE 10 MMOL/20ML IV SOLN
20.00 mL | Freq: Once | INTRAVENOUS | Status: AC | PRN
Start: 2018-09-17 — End: 2018-09-17
  Administered 2018-09-17: 10 mmol via INTRAVENOUS

## 2018-09-17 MED ORDER — FUROSEMIDE 40 MG PO TABS
40.0000 mg | ORAL_TABLET | Freq: Every morning | ORAL | Status: DC
Start: 2018-09-18 — End: 2018-09-20
  Administered 2018-09-18 – 2018-09-19 (×2): 40 mg via ORAL
  Filled 2018-09-17 (×3): qty 1

## 2018-09-17 NOTE — Consults (Signed)
Date: 09/17/2018  REASON FOR CONSULTATION: This is a consult requested by the ER  regarding right lower extremity.    HISTORY: This is a 73 year old gentleman who was admitted with  pain in his calf, possibly an early cellulitis and recent trauma  that he has no recollection of.  He started getting cramps in  his right lower extremity calf area back on Saturday, but does  not remember any trauma, doing anything strenuous that could  have caused this.  He presented to the emergency room today.  The ER physician ordered an MRI scan, which shows a hematoma in  a torn gastrocnemius muscle.  No evidence for an abscess at this  time.  I am consulted to see him.    MEDICAL HISTORY: Includes that he has atrial fib and he is  taking Xarelto at this time and other medical issues that he  has, also has some asthma problems and hypertension.    REVIEW OF SYSTEMS: Negative.    PHYSICAL EXAMINATION:  GENERAL:  Awake, alert, and oriented.  No apparent distress.  EXTREMITIES:  the right lower extremity is definitely swollen.  There is slight redness.  He can dorsiflex and extend his ankle  and flex and extend his toes without significant pain.  He is  tender to touch over the reddened area in the back of his calf  and his calf is fairly firm.    MEDICATIONS: Reviewing his medications, he is on Xarelto 15 p.o.  daily for atrial fibrillation.    ASSESSMENT AND PLAN: This patient obviously has a hematoma.  I  do not believe that is an abscess at this time.  His leg needs  to be iced, elevated and he should be off all anticoagulants  until this resolves.  We will be happy to follow with you.  Also  note, because he can dorsiflex his ankle and feet and his  sensation is intact and palpable pulse.  At this stage, I do not  believe he has a compartment.  Once again, thank you for the  consultation.        [id number]  DD: 09/17/2018 96:29:52  DT: 09/17/2018 84:13:24  JOB: [id number]

## 2018-09-17 NOTE — H&P (Signed)
Medicine History & Physical   [facility]  [employer]   Patient Name: Christopher Dickerson, Christopher SR. LOS: 0 days   Attending Physician: Donalynn Furlong, MD PCP: Gerilyn Pilgrim, MD      Assessment and Plan:                                                              1.  Right calf hematoma with third-degree gastrocnemius muscle tear leukocytosis  Patient had ultrasound lower extremity 2 days ago negative for DVT  Hold Xarelto as per orthopedics for now no evidence of compartment syndrome as per orthopedics  We will continue vancomycin to cover underlying cellulitis  Pain control  Follow-up orthopedics recommendation  2.  H of COPD not on exacerbation resume home medication   3.  History of A. fib hold Xarelto due to hematoma  Continue metoprolol and amiodarone for rate control  4.  Hypertension continue home medication    DVT PPx: Contraindicated   Dispo: Observation  Code: do not resuscitate     History of Presenting Illness                                CC: Right leg pain and swelling  Christopher Mcadory Sr. is a 73 y.o. male patient   Recall history of A. fib with left ventricular apical thrombus, COPD that has come to ED with above-mentioned complaints.  Since Sunday patient noticed pain and swelling on right calf came to ED on Monday had ultrasound right leg which was negative for DVT patient was discharged.  However since then patient continued to have worsening pain with swelling.  Today in ED MRI right leg showed gastrocnemius muscle tear with hematoma WBC 16.2.  Orthopedics already evaluated the patient.  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    Secondary to Atrial Fib     Past Surgical History:   Procedure Laterality Date    APPENDECTOMY      COLONOSCOPY, POLYPECTOMY  12/16/2012    Procedure: COLONOSCOPY,  POLYPECTOMY;  Surgeon: Gwenith Spitz, MD;  Location: [facility];  Service: Gastroenterology;  Laterality: N/A;    EGD, BIOPSY  12/16/2012    Procedure: EGD, BIOPSY;  Surgeon: Gwenith Spitz, MD;  Location: [facility];  Service: Gastroenterology;  Laterality: N/A;    KNEE ARTHROSCOPY W/ ACL RECONSTRUCTION      LIFT, ARM (MEDICAL)      RIGHT & LEFT HEART CATH N/A 05/20/2018    Procedure: RIGHT & LEFT HEART CATH;  Surgeon: Rudean Curt, MD;  Location[facility] CATH/EP;  Service: Cardiovascular;  Laterality: N/A;  total heart cath 05/20/18       History reviewed. No pertinent family history.  Social History     Tobacco Use    Smoking status: Former Smoker     Packs/day: 0.50     Types: Cigarettes    Smokeless tobacco: Never Used   Substance Use Topics    Alcohol use: Yes  Comment: occasional    Drug use: No       Subjective   Review of Systems:  Review of systems as noted in HPI. Full 12 point ROS otherwise negative.         Objective   Physical Exam:     Vitals: T:98.8 F (37.1 C) (Oral),  BP:143/83, HR:76, RR:17, SaO2:97%    1) General Appearance: Alert and oriented x 4. In no acute distress.   2) Eyes: Pink conjunctiva, anicteric sclera. Pupils are equally reactive to light.  3) ENT: Oral mucosa moist with no pharyngeal congestion, erythema or swelling.  4) Neck: Supple, with full range of motion. Trachea is central, no JVD noted  5) Chest: Clear to auscultation bilaterally, no wheezes or rhonchi.  6) CVS: normal rate and regular rhythm, with no murmurs.  7) Abdomen: Soft, non-tender, no palpable mass. Bowel sounds normal. No CVA tenderness  8) Extremities: No pitting edema, pulses palpable, no calf swelling and no gross deformity.  Swelling right leg tenderness  9) Skin: Warm, dry with normal skin turgor, no rash  10) Lymphatics: No lymphadenopathy in axillary, cervical and inguinal area.   11) Neurological: Cranial nerves II-XII intact. No gross focal motor or sensory deficits noted.  12)  Psychiatric: Affect is appropriate. No hallucinations.      Patient Vitals for the past 12 hrs:   BP Temp Pulse Resp   09/17/18 1957 143/83  76 17   09/17/18 1925 (!) 153/97  78 17   09/17/18 1904 (!) 180/99   17   09/17/18 1707 (!) 153/98  86 17   09/17/18 1654 119/81  (!) 56 17   09/17/18 1533 (!) 122/97 98.8 F (37.1 C) (!) 101 17     Weight Monitoring 05/24/2018 05/25/2018 05/26/2018 05/27/2018 06/29/2018 09/14/2018 09/17/2018   Height - - - - 182.9 cm - 182.9 cm   Height Method - - - - - - Stated   Weight 87.5 kg 87.2 kg 85.6 kg 85.4 kg 96.616 kg 114 kg 104.327 kg   Weight Method Standing Scale Standing Scale Standing Scale Standing Scale - Actual Estimated   BMI (calculated) - - - - 28.9 kg/m2 - 31.3 kg/m2          Recent Results (from the past 24 hour(s))   Comprehensive metabolic panel    Collection Time: 09/17/18  3:42 PM   Result Value Ref Range    Sodium 140 136 - 147 mMol/L    Potassium 4.2 3.5 - 5.3 mMol/L    Chloride 102 98 - 110 mMol/L    CO2 27 20 - 30 mMol/L    Calcium 8.6 8.5 - 10.5 mg/dL    Glucose 284 (H) 71 - 99 mg/dL    Creatinine 1.32 (H) 0.80 - 1.30 mg/dL    BUN 27 (H) 7 - 22 mg/dL    Protein, Total 7.9 6.0 - 8.3 gm/dL    Albumin 3.8 3.5 - 5.0 gm/dL    Alkaline Phosphatase 85 40 - 145 U/L    ALT 16 0 - 55 U/L    AST (SGOT) 19 10 - 42 U/L    Bilirubin, Total 1.0 0.1 - 1.2 mg/dL    Albumin/Globulin Ratio 0.93 0.80 - 2.00 Ratio    Anion Gap 15.2 7.0 - 18.0 mMol/L    BUN/Creatinine Ratio 15.4 10.0 - 30.0 Ratio    EGFR 38 (L) 60 - 150 mL/min/1.51m2    Osmolality Calculated 288 275 - 300 mOsm/kg  Globulin 4.1 (H) 2.0 - 4.0 gm/dL   CBC and differential    Collection Time: 09/17/18  3:42 PM   Result Value Ref Range    WBC 16.2 (H) 4.0 - 11.0 K/cmm    RBC 3.95 (L) 4.00 - 5.70 M/cmm    Hemoglobin 12.7 (L) 13.0 - 17.5 gm/dL    Hematocrit 57.8 (L) 39.0 - 52.5 %    MCV 97 80 - 100 fL    MCH 32 28 - 35 pg    MCHC 33 32 - 36 gm/dL    RDW 46.9 62.9 - 52.8 %    PLT CT 271 130 - 440 K/cmm    MPV 7.6 6.0 -  10.0 fL    NEUTROPHIL % 85.6 (H) 42.0 - 78.0 %    Lymphocytes 6.5 (L) 15.0 - 46.0 %    Monocytes 6.7 3.0 - 15.0 %    Eosinophils % 1.0 0.0 - 7.0 %    Basophils % 0.2 0.0 - 3.0 %    Neutrophils Absolute 13.8 (H) 1.7 - 8.6 K/cmm    Lymphocytes Absolute 1.1 0.6 - 5.1 K/cmm    Monocytes Absolute 1.1 0.1 - 1.7 K/cmm    Eosinophils Absolute 0.2 0.0 - 0.8 K/cmm    BASO Absolute 0.0 0.0 - 0.3 K/cmm   Creatine Kinase (CK)    Collection Time: 09/17/18  3:42 PM   Result Value Ref Range    Creatine Kinase (CK) 176 30 - 230 U/L   C Reactive Protein    Collection Time: 09/17/18  3:42 PM   Result Value Ref Range    C-Reactive Protein 12.94 (H) 0.02 - 0.80 mg/dL   Sedimentation rate (ESR)    Collection Time: 09/17/18  3:42 PM   Result Value Ref Range    Sed Rate 28 (H) 0 - 20 mm/hr   Procalcitonin    Collection Time: 09/17/18  3:42 PM   Result Value Ref Range    PROCALCITONIN 0.12 0.00 - 0.24 ng/mL        Allergies   Allergen Reactions    Codeine       Mri Tibia Fibula Right W Wo Contrast    Result Date: 09/17/2018  1.  Grade 3 muscle tear of the medial head of the right gastrocnemius muscle. 2.  Small simple right Baker's cyst. ReadingStation:[id number]    US Venous Low Extrem Duplx Dopp Uni Right    Result Date: 09/14/2018  No evidence of deep vein thrombosis right leg. There is a 3.9 x 1.6 x 2.1 cm complex fluid collection at the right posterior fossa. ReadingStation:[id number]     Home Medications     Med List Status:  Pharmacy Completed Set By: Claudie Revering, PharmD at 09/17/2018  8:25 PM                albuterol (PROVENTIL HFA;VENTOLIN HFA) 108 (90 Base) MCG/ACT inhaler     Inhale 2 puffs into the lungs every 6 (six) hours as needed (wheezing)     amiodarone (PACERONE) 200 MG tablet     Take 200 mg by mouth 2 (two) times daily        Ascorbic Acid (VITAMIN C) 500 MG Chew Tab     Chew 2 each by mouth every morning     furosemide (LASIX) 40 MG tablet     Take 40 mg by mouth every morning        metoprolol tartrate  (LOPRESSOR) 25 MG tablet  Take 1 tablet (25 mg total) by mouth 2 (two) times daily     mirtazapine (REMERON) 7.5 MG tablet     Take 7.5 mg by mouth every evening        rivaroxaban (XARELTO) 15 MG Tab     Take 15 mg by mouth every morning        vitamin E 400 UNIT capsule     Take 400 Units by mouth every morning        Ongoing Comment    Stottlemyer, Minerva Areola, PharmD    09/17/2018  8:25 PM    Reviewed medications with patient - KK/ERS    Unable to review patient's medications as pt stated he is not familiar with his medications that he takes at home. No med list brought by patient as well. - RN Aggie Cosier 5/21/202        05/18/2018 - Pt was not sure what he was currently taking. All PTA medications are per his only known outpatient pharmacy as well as his dispense report. Pt was on aldactone and coreg per his last discharge but has not ha d those filled since 03/04/2018 for 15 and 30 day supplies respectively.    ---------------------------------------------------------------------------------------------------------------------------    Pt's warfarin previously managed by [facility] anticoag clinic; however, pt discharge from clinic due to noncompliance. Pt still taking 5mg  daily without monitoring.          Meds given in the ED:  Medications   HYDROmorphone (DILAUDID) injection 0.5 mg (has no administration in time range)   vancomycin (VANCOCIN) 1 g/200 mL dextrose IVPB (premix) (0 mg Intravenous Stopped 09/17/18 1954)   sodium chloride 0.9 % bolus 1,000 mL (0 mLs Intravenous Stopped 09/17/18 1804)   HYDROmorphone (DILAUDID) injection 1 mg (1 mg Intravenous Given 09/17/18 1703)   ondansetron (ZOFRAN) injection 4 mg (4 mg Intravenous Given 09/17/18 1704)   gadoterate meglumine (DOTAREM) injection 10 mmol (10 mmol Intravenous Imaging Agent Given 09/17/18 1858)      Time Spent:     Ilda Mori, MD     09/17/18,9:19 PM   MRN: [id number]                                      CSN: [id number] DOB: 1945/06/11

## 2018-09-17 NOTE — ED Notes (Signed)
Pt vitals re assessed and WNL at this time. Pt medicated with 0.5 mg Dilaudid prior to departure from ED d/t 10/10 pain with movement. Pt denies any other needs. Stable for transport at this time.

## 2018-09-17 NOTE — Telephone Encounter (Signed)
Per Dr Metta Clines  I    "As stated in the note, we need to get a limited echo before we decide to discontinue it or proceed with ICD. Lets schedule the echo ASAP and if there is any papers to sign for the lifevest, u can bring it to me "    Please call pt to get him rescheduled for ECHO ASAP    Ahill pN

## 2018-09-17 NOTE — ED Notes (Signed)
Pt wheeled to MRI via stretcher.

## 2018-09-17 NOTE — Consults (Signed)
[facility] Patient                    Full consult dictated

## 2018-09-17 NOTE — ED Notes (Signed)
Bed: S21-A  Expected date:   Expected time:   Means of arrival:   Comments:  EMS Rt Leg Pain

## 2018-09-17 NOTE — ED Notes (Signed)
Unable to review patient's medications as pt stated he is not familiar with his medications that he takes at home. No med list brought by patient as well.

## 2018-09-17 NOTE — ED Provider Notes (Signed)
Physician/Midlevel provider first contact with patient: 09/17/18 1602         History     Chief Complaint   Patient presents with    Leg Swelling     HPI   Patient states that he had some right lower leg crampy pain on Saturday.  On Sunday he noticed the right lower leg was swelling and the pain worsened.  He was seen here on Monday.  Ultrasound of the leg showed a 3.9 x 1.6 x 2.1 cm complex fluid collection in the right posterior fossa.  Since Monday the pain has increased and the swelling has increased.  He is now unable to walk on his right leg.  He had no trauma to his leg.  No strain or new activity.  He is on Xarelto for atrial fibrillation.  No fever or chills.  No diaphoresis.  No history of a DVT or PE.  No chest pain or shortness of breath.  No family history of clotting disorder.      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    Secondary to Atrial Fib       Past Surgical History:   Procedure Laterality Date    APPENDECTOMY      COLONOSCOPY, POLYPECTOMY  12/16/2012    Procedure: COLONOSCOPY, POLYPECTOMY;  Surgeon: Gwenith Spitz, MD;  Location: [facility];  Service: Gastroenterology;  Laterality: N/A;    EGD, BIOPSY  12/16/2012    Procedure: EGD, BIOPSY;  Surgeon: Gwenith Spitz, MD;  Location: [facility];  Service: Gastroenterology;  Laterality: N/A;    KNEE ARTHROSCOPY W/ ACL RECONSTRUCTION      LIFT, ARM (MEDICAL)      RIGHT & LEFT HEART CATH N/A 05/20/2018    Procedure: RIGHT & LEFT HEART CATH;  Surgeon: Rudean Curt, MD;  Location: [facility] CATH/EP;  Service: Cardiovascular;  Laterality: N/A;  total heart cath 05/20/18       History reviewed. No pertinent family history.    Social  Social History     Tobacco Use    Smoking status: Former Smoker     Packs/day: 0.50     Types: Cigarettes    Smokeless tobacco: Never  Used   Substance Use Topics    Alcohol use: Yes     Comment: occasional    Drug use: No       .     Allergies   Allergen Reactions    Codeine        Home Medications     Med List Status:  Pharmacy Completed Set By: Claudie Revering, PharmD at 09/17/2018  8:25 PM                albuterol (PROVENTIL HFA;VENTOLIN HFA) 108 (90 Base) MCG/ACT inhaler     Inhale 2 puffs into the lungs every 6 (six) hours as needed (wheezing)     amiodarone (PACERONE) 200 MG tablet     Take 200 mg by mouth 2 (two) times daily        Ascorbic Acid (VITAMIN C) 500 MG Chew Tab     Chew 2 each by mouth every morning     furosemide (LASIX) 40 MG tablet     Take 40 mg by mouth every morning  metoprolol tartrate (LOPRESSOR) 25 MG tablet     Take 1 tablet (25 mg total) by mouth 2 (two) times daily     mirtazapine (REMERON) 7.5 MG tablet     Take 7.5 mg by mouth every evening        rivaroxaban (XARELTO) 15 MG Tab     Take 15 mg by mouth every morning        vitamin E 400 UNIT capsule     Take 400 Units by mouth every morning        Ongoing Comment    Stottlemyer, Minerva Areola, PharmD    09/17/2018  8:25 PM    Reviewed medications with patient - KK/ERS    Unable to review patient's medications as pt stated he is not familiar with his medications that he takes at home. No med list brought by patient as well. - RN Aggie Cosier 5/21/202        05/18/2018 - Pt was not sure what he was currently taking. All PTA medications are per his only known outpatient pharmacy as well as his dispense report. Pt was on aldactone and coreg per his last discharge but has not ha d those filled since 03/04/2018 for 15 and 30 day supplies respectively.    ---------------------------------------------------------------------------------------------------------------------------    Pt's warfarin previously managed by [facility] anticoag clinic; however, pt discharge from clinic due to noncompliance. Pt still taking 5mg  daily without monitoring.            Review of Systems    Constitutional: Negative for chills and fever.   HENT: Negative for sore throat.    Eyes: Negative for visual disturbance.   Respiratory: Negative for cough and shortness of breath.    Cardiovascular: Positive for leg swelling. Negative for chest pain.   Gastrointestinal: Negative for abdominal pain.   Genitourinary: Negative for dysuria.   Musculoskeletal: Negative for back pain.   Skin: Negative for rash.   Neurological: Negative for headaches.   Psychiatric/Behavioral: Negative for confusion.       Physical Exam    BP: (!) 122/97, Heart Rate: (!) 101, Temp: 98.8 F (37.1 C), Resp Rate: 17, SpO2: 98 %, Weight: 104.3 kg    Physical Exam  Constitutional:       Appearance: Normal appearance.   HENT:      Head: Normocephalic and atraumatic.      Nose: Nose normal.      Mouth/Throat:      Mouth: Mucous membranes are moist.      Pharynx: Oropharynx is clear.   Eyes:      Conjunctiva/sclera: Conjunctivae normal.      Pupils: Pupils are equal, round, and reactive to light.   Neck:      Musculoskeletal: Normal range of motion and neck supple.   Cardiovascular:      Rate and Rhythm: Regular rhythm. Tachycardia present.      Heart sounds: Normal heart sounds.      Comments: Mildly tachycardic  Pulmonary:      Effort: Pulmonary effort is normal. No respiratory distress.      Breath sounds: Normal breath sounds.   Abdominal:      Tenderness: There is no abdominal tenderness. There is no guarding.   Musculoskeletal:         General: Swelling and tenderness present.      Comments: The right lower leg is very swollen and tender.  Posteriorly on the proximal portion of the calf there is some redness which does blanch.  There is some ecchymosis in the right popliteal fossa area.  The right dorsal pedal pulse is difficult to palpate.  I did check the pulse with a sonogram machine.  It is present.  The right foot has good color and toes have 3-second capillary refill.   Skin:     General: Skin is warm and dry.   Neurological:       General: No focal deficit present.      Mental Status: He is alert and oriented to person, place, and time.   Psychiatric:         Mood and Affect: Mood normal.         Behavior: Behavior normal.           MDM and ED Course     ED Medication Orders (From admission, onward)    Start Ordered     Status Ordering Provider    09/18/18 0900 09/17/18 2122  lactobacillus species (BIO-K PLUS) capsule 50 Billion CFU  Daily     Route: Oral  Ordered Dose: 50 Billion CFU     Last MAR action:  Given RAHMAN, MOHAMMAD A    09/18/18 0500 09/17/18 2122    Every 12 hours     Route: Intravenous  Ordered Dose: 1,000 mg     Discontinued Cherlynn Perches A    09/17/18 2125 09/17/18 2125    See admin instructions     Route: Does not apply     Discontinued Devanee Pomplun J    09/17/18 2116 09/17/18 2116  HYDROmorphone (DILAUDID) injection 0.5 mg  Every 4 hours PRN     Route: Intravenous  Ordered Dose: 0.5 mg     Last MAR action:  Given RAHMAN, MOHAMMAD A    09/17/18 1858 09/17/18 1858  gadoterate meglumine (DOTAREM) injection 10 mmol  IMG once as needed     Route: Intravenous  Ordered Dose: 20 mL     Last MAR action:  Imaging Agent Given Titilayo Hagans J    09/17/18 1642 09/17/18 1641  vancomycin (VANCOCIN) 1 g/200 mL dextrose IVPB (premix)  Once in ED     Route: Intravenous  Ordered Dose: 1,000 mg     Last MAR action:  Stopped Rudra Hobbins J    09/17/18 1642 09/17/18 1641  sodium chloride 0.9 % bolus 1,000 mL  Once in ED     Route: Intravenous  Ordered Dose: 1,000 mL     Last MAR action:  Stopped Neamiah Sciarra J    09/17/18 1642 09/17/18 1641  HYDROmorphone (DILAUDID) injection 1 mg  Once in ED     Route: Intravenous  Ordered Dose: 1 mg     Last MAR action:  Given Carey Johndrow J    09/17/18 1642 09/17/18 1641  ondansetron (ZOFRAN) injection 4 mg  Once in ED     Route: Intravenous  Ordered Dose: 4 mg     Last MAR action:  Given Shabrea Weldin J             MDM  Differential diagnosis includes but is not limited  to right calf muscle tear with hematoma, abscess, compartment syndrome    ED Course as of Sep 17 1649   Thu Sep 17, 2018   2007 I spoke with Dr. Jarold Motto.  He is coming to see the patient here now.    [MM]      ED Course User Index  [MM] Derek Jack Nolon Bussing, MD  Procedures    Clinical Impression & Disposition     Clinical Impression  Final diagnoses:   Gastrocnemius muscle tear, right, initial encounter   Hematoma of leg, right, initial encounter   Cellulitis of leg, right        ED Disposition     ED Disposition Condition Date/Time Comment    Admit  Thu Sep 17, 2018  8:08 PM            Current Discharge Medication List                    Donalynn Furlong, MD  09/18/18 1651

## 2018-09-17 NOTE — ED Notes (Signed)
Patient's daughter and son called, pt's daughter spoke to the patient and spoke to this RN. She was noted of planned MRI and initiated antibiotics, made her aware but there is no final disposition for her father yet as we are waiting for the blood results. Understand and agreeable.

## 2018-09-17 NOTE — ED Notes (Signed)
Patient has been and back from MRI via stretcher

## 2018-09-17 NOTE — ED Notes (Signed)
Blood culture drawn at 1700 from left forearm. Site was prepped with x4 alcohol and x4 chlorhexidine. Patient was moving arm during cleaning, instructed to keep arm still. Labeled correctly and sent to lab.

## 2018-09-18 MED ORDER — VH DOXYCYCLINE 100 MG PO (WRAP)
100.00 mg | ORAL_CAPSULE | Freq: Two times a day (BID) | ORAL | Status: DC
Start: 2018-09-18 — End: 2018-09-18
  Filled 2018-09-18: qty 1

## 2018-09-18 MED ORDER — VH VANCOMYCIN 1500 MG IN NS 500 ML (SIMPLE)
1500.00 mg | INTRAVENOUS | Status: DC
Start: 2018-09-18 — End: 2018-09-18
  Filled 2018-09-18: qty 500

## 2018-09-18 MED ORDER — VH DOXYCYCLINE 100 MG PO (WRAP)
100.00 mg | ORAL_CAPSULE | Freq: Two times a day (BID) | ORAL | Status: DC
Start: 2018-09-18 — End: 2018-09-20
  Administered 2018-09-18 – 2018-09-19 (×3): 100 mg via ORAL
  Filled 2018-09-18 (×5): qty 1

## 2018-09-18 MED ORDER — DOXYCYCLINE HYCLATE 100 MG IV SOLR
100.00 mg | Freq: Once | INTRAVENOUS | Status: AC
Start: 2018-09-18 — End: 2018-09-18
  Administered 2018-09-18: 11:00:00 100 mg via INTRAVENOUS
  Filled 2018-09-18: qty 100

## 2018-09-18 NOTE — Progress Notes (Signed)
Date Time:  09/18/18 9:05 AM Attending: Jonn Shingles, MD   [name]     PCP: Christopher Pilgrim, MD          INTERIM SUMMARY:  Pt. is a 72/M w/ PMhx of afib w/ L. ventricular apical thrombus and COPD. He presented to the ED d/t pain and swelling of R. LE. [place] on Monday was negative for DVT. When pain continued he was evaluated w/ MRI on admission and found to have gastrocnemius tear w/ hematoma and WBC elevation. Orthopedic surgery evaluated patinetn ad recommended rest, ie, and elevation w/o surgial treatment. Also recommended holding anticoagulation in order for hematoma to heal. Monitoring for 24hr on these recommendations for improvement           SUBJECTIVE:  pt. reports continued swelling and discomfort, hwoever icepacck is off and needs repositioning.         Objective :  Vitals:    09/17/18 2148 09/17/18 2224 09/18/18 0009 09/18/18 0655   BP:  (!) 176/106 141/75 137/84   Pulse:  88 85 82   Resp:  20 18 18    Temp: 98.1 F (36.7 C) 100.2 F (37.9 C) 99.7 F (37.6 C) 99.9 F (37.7 C)   TempSrc: Oral Temporal Oral Temporal   SpO2:  98% 95% 93%   Weight:       Height:           Intake/Output Summary (Last 24 hours) at 09/18/2018 0905  Last data filed at 09/18/2018 0826  Gross per 24 hour   Intake 1440 ml   Output 525 ml   Net 915 ml     Weight Monitoring 06/29/2018 09/14/2018 09/17/2018   Height 182.9 cm - 182.9 cm   Height Method - - Stated   Weight 96.616 kg 114 kg 104.327 kg   Weight Method - Actual Estimated   BMI (calculated) 28.9 kg/m2 - 31.3 kg/m2       Body mass index is 31.19 kg/m.        MEDS: (SCHEDULED/INFUSIONS/PRN)  Current Facility-Administered Medications   Medication Dose Route Frequency   . amiodarone  200 mg Oral Q12H SCH   . furosemide  40 mg Oral QAM   . lactobacillus species  50 Billion CFU Oral Daily   . metoprolol tartrate  25 mg Oral Q12H SCH   . mirtazapine  7.5 mg Oral QPM   . sodium chloride (PF)  3 mL  Intravenous Q8H   . vancomycin  1,500 mg Intravenous Q24H   . vancomycin therapy placeholder   Does not apply See Admin Instructions     Current Facility-Administered Medications   Medication Dose Route Frequency Last Rate     Current Facility-Administered Medications   Medication Dose Route   . acetaminophen  650 mg Oral    Or   . acetaminophen  650 mg per NG tube    Or   . acetaminophen  650 mg Rectal   . albuterol  2.5 mg Nebulization   . HYDROcodone-acetaminophen  1-2 tablet Oral   . HYDROmorphone  0.5 mg Intravenous   . naloxone  0.4 mg Intravenous            LABS:    Recent CBC:  Recent Labs   Lab 09/17/18  1542 09/14/18  1628   WBC 16.2* 9.6   RBC 3.95* 4.38   Hemoglobin 12.7* 14.2   Hematocrit 38.3* 42.1   MCV 97 96   PLT CT 271  229       Recent BMP:  Recent Labs   Lab 09/17/18  1542 09/14/18  1628   Sodium 140 140   Potassium 4.2 4.3   Chloride 102 104   CO2 27 27   BUN 27* 29*   Creatinine 1.75* 1.58*   Glucose 166* 112*   EGFR 38* 43*   Calcium 8.6 8.9       Other Labs (if any):  LFTs     -      Recent Labs   Lab 09/17/18  1542   ALT 16   AST (SGOT) 19   Bilirubin, Total 1.0   Albumin 3.8   Alkaline Phosphatase 85     Cardiac -   Recent Labs   Lab 09/17/18  1542   Creatine Kinase (CK) 176     PT/PTT -          IMAGING STUDIES:  Mri Tibia Fibula Right W Wo Contrast    Result Date: 09/17/2018  1.  Grade 3 muscle tear of the medial head of the right gastrocnemius muscle. 2.  Small simple right Baker's cyst. ReadingStation:[id number]    US Venous Low Extrem Duplx Dopp Uni Right    Result Date: 09/14/2018  No evidence of deep vein thrombosis right leg. There is a 3.9 x 1.6 x 2.1 cm complex fluid collection at the right posterior fossa. ReadingStation:[email or web address]         Physical Exam:  Vitals:  T:99.9 F (37.7 C) (Temporal)   BP:137/84, HR:82, RR:18, SaO2:93%  General: Awake, Alert. No Acute Distress.  HEENT:  Normocephalic Atraumatic. Mucus Membranes Moist, EOMI, PERRL  Cardiovascular: RRR. Normal Heart  Sounds. Brisk cap refill  Respiratory: CTAB. No rales, rhonchi or retractions.  Abdomen/GI: Nontender, nondistended. Normoactive BS  Integument: Erythema of R. LE  Extremities: Expected degrees of motion in all 4 extremities. Mild nonpitting swelling of R. LE w/o fluid load.  Neurologic: No focal neurologic abnormalities or gross focal deficit  Psychiatric: AAOx3, cooperative.       ASSESSMENT AND PLAN:  # R. calf hematoma w/   # 3rd Degree Gastrocneius tear.  No s/s to suggest compartment syndrome - patient is clinically doing well. Continuing Switching vanc to doxycycline to cover cellulitis. I suspect this is primarily hematoma driven w/o s/s of infecion systemically.   Continue w/ ortho recommendations for Ice - q2hr adjustment / checks per RN for today - + elevation. if improving will d/c in AM (tomorrow)    # COPD  Chronic. Stable. Continuing   PRN albuterol.    # Afib w/ #h/o LV thrombus  Stable. Continue home Metoprolol + Pacerone.  We unfortunately will need to hold naticoagulation in the short term d/t patient's hematoma.    # Depression  Stable. Continue home dose of  Remeron    # Obesity  Stable. Pt. counseled on importance of weight loss and proper dietary habits.  Monitor ins and maintain healthy diet while inpatient.       Disposition:  Monitor hematoma w/ likely d/c in AM if clinically doing well  DVTPPx:  Held 2/2 hematoma  Code:  NO CPR - SUPPORT OK    Signed by:  Jonn Shingles, MD  Please contact at phone number [phone] for any questions.

## 2018-09-18 NOTE — Progress Notes (Signed)
Some better  Pe: sensation intact  Able to flex and extend toes  Normal ankle rom  Still swollen  A/p elevate higher to relieve tightness in calf

## 2018-09-18 NOTE — UM Notes (Signed)
[facility] Utilization Management Review Sheet    Facility :  [facility]    NAME: Christopher Kuang Sr.  MR#: [id number]    CSN#: [id number]    ROOM: 446/446-A AGE: 73 y.o.    ADMIT DATE AND TIME: 09/17/2018  3:30 PM       PATIENT CLASS: OBSERVATION  09/17/18 @ 2118 ADMIT OBSERVATION-OBS UNIT  [facility] CRITERIA OC 022    ATTENDING PHYSICIAN: Ilda Mori, MD  PAYOR:Payor: MEDICARE [facility] / Plan: [facility] MEDICARE PPO / Product Type: MANAGED MEDICARE /       AUTH #: [id number]    DIAGNOSIS:     ICD-10-CM    1. Gastrocnemius muscle tear, right, initial encounter S86.111A    2. Hematoma of leg, right, initial encounter S80.11XA    3. 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  He started getting cramps in this RLE on Saturday. He denies any trauma.    ABNORMAL LABS: WBC 16.2 GLUC 166  BUN 27 CR 1.75  CK 176 CRP 12.94 SED RATE 28    DIAGNOSTIC TESTING:MRI TIBIA FIBULA W WO CONTRAST- Grade 3 muscle tear of the medial head of the right gastrocnemius muscle. 2. Small simple right Baker's cyst.    VS: T:98.8 F (37.1 C) (Oral),  BP:143/83, HR:76, RR:17, SaO2:97%    ABNORMAL FINDINGS: right lower extremity is definitely swollen with some redness    Assessment and Plan:                                                              1.  Right calf hematoma with third-degree gastrocnemius muscle tear leukocytosis  Patient had ultrasound lower extremity 2 days ago negative for DVT  Hold Xarelto as per orthopedics for now  no evidence of compartment syndrome as per orthopedics  We will continue vancomycin to cover underlying cellulitis  Pain control  Follow-up orthopedics recommendation  2.  H of COPD not on exacerbation resume home medication   3.  History of A. fib hold Xarelto due to hematoma  Continue metoprolol and amiodarone for rate control  4.  Hypertension continue home medication    DVT PPx: Contraindicated   Dispo: Observation       OBSERVATION ORDERS   VS Q 8 HRS, TELEMETRY, ORTHO CONSULT, VANCOMYCIN 1500 IV QD, BIO K PLUS PO, CONTINUE HOME MEDS, HOLD XARELTO, ELEVATE AND ICE    ORTHO CONSULT  ASSESSMENT AND PLAN: This patient obviously has a hematoma.  I do not believe that this is an abscess at this time.  His leg needs to be  iced, elevated and he should be off all anticoagulants until this resolves.  We will be happy to follow with you.   At this stage, I do not believe he has a compartment.      DAY 2 OBSERVATION FOLLOW-UP  09/18/18  LABS: NONE  VS: BP 137/84    Pulse 82    Temp 99.9 F (37.7 C) (Temporal)    Resp 18    Ht 1.829 m (6')    Wt 104.3 kg (230 lb)    SpO2 93%    BMI 31.19 kg/m   CURRENT ORDERS AS ABOVE  ORTHO-  Some better  Pe: sensation intact  Able to flex and extend toes  Normal ankle rom  Still swollen  A/p elevate higher to relieve tightness in calf    Wende Mott, RN  Utilization Management  Case Management Department    [facility]  [address]  [place], [place] [address]  T [phone]     F [phone]    [email or web address]

## 2018-09-18 NOTE — Telephone Encounter (Signed)
Called patient and this echo has been scheduled with his son. Patient is in the hospital right now due to knee, but should be released in a couple of days. They will call back if this needs to be r/s for any reason.  Thank you, Candice

## 2018-09-18 NOTE — Nursing Progress Note (Signed)
NURSE NOTE SUMMARY  [facility] - 4TH SURGICAL   Patient Name: MALIC, ROSTEN SR.   Attending Physician: Ilda Mori, MD   Today's date:   09/18/2018 LOS: 0 days   Shift Summary:                                                              Pt admitted from the ED at 2220. Unable to stand on RLE. RLE with swelling and redness. Pt reports pain at 5/10. States calf "just feels tight." Denies n/t, loss of sensation. He is able to wiggle toes. Medicated with Norco and pt reports improvement in pain.    Provider Notifications:      Rapid Response Notifications:  Mobility:      PMP Activity: Step 4 - Dangle at Bedside (09/17/2018 11:02 PM)     Weight tracking:  Family Dynamic:   Last 3 Weights for the past 72 hrs (Last 3 readings):   Weight   09/17/18 1533 104.3 kg (230 lb)          Health Care Agent: Victorino December - Daughter - [phone]     Recent Vitals Last Bowel Movement   BP: 141/75 (09/18/2018 12:09 AM)  Heart Rate: 85 (09/18/2018 12:09 AM)  Temp: 99.7 F (37.6 C) (09/18/2018 12:09 AM)  Resp Rate: 18 (09/18/2018 12:09 AM)  Height: 1.829 m (6') (09/17/2018  5:23 PM)  Weight: 104.3 kg (229 lb 15 oz) (09/17/2018  5:23 PM)  SpO2: 95 % (09/18/2018 12:09 AM)   Last BM Date: 09/15/18

## 2018-09-18 NOTE — Plan of Care (Addendum)
NURSE NOTE SUMMARY  [facility] - 4TH SURGICAL   Patient Name: Christopher Dickerson, Christopher SR.   Attending Physician: Ilda Mori, MD   Today's date:   09/18/2018 LOS: 0 days   Shift Summary:                                                              1610: Report received from [facility]. Care of patient assumed at this time.    Provider Notifications:   [phone]:  Per Dr. Jarold Motto and Dr. Georgianne Fick, elevate patient's lower extremities and Ice. Patient placed in trendelenburg position, pillows placed under RLE,  and fresh Ice packs applied.    Rapid Response Notifications:  Mobility:      PMP Activity: Step 4 - Dangle at Bedside (09/18/2018  7:28 AM)     Weight tracking:  Family Dynamic:   Last 3 Weights for the past 72 hrs (Last 3 readings):   Weight   09/17/18 1533 104.3 kg (230 lb)          Health Care Agent: Victorino December - Daughter - [phone]     Recent Vitals Last Bowel Movement   BP: 137/84 (09/18/2018  6:55 AM)  Heart Rate: 82 (09/18/2018  6:55 AM)  Temp: 99.9 F (37.7 C) (09/18/2018  6:55 AM)  Resp Rate: 18 (09/18/2018  6:55 AM)  Height: 1.829 m (6') (09/17/2018  5:23 PM)  Weight: 104.3 kg (229 lb 15 oz) (09/17/2018  5:23 PM)  SpO2: 93 % (09/18/2018  6:55 AM)   Last BM Date: 09/15/18       Problem: Compromised Tissue integrity  Goal: Damaged tissue is healing and protected  Description  Interventions:  1. Monitor/assess Braden scale every shift  2. Provide wound care per wound care algorithm  3. Reposition patient every 2 hours and as needed unless able to reposition self  4. Increase activity as tolerated/progressive mobility  5. Relieve pressure to bony prominences for patients at moderate and high risk  6. Avoid shearing injuries   7. Keep intact skin clean and dry  8. Use bath wipes, not soap and water, for daily bathing   9. Use incontinence wipes for cleaning urine, stool and caustic drainage; Foley care as needed   10. Monitor external devices/tubes for correct placement to prevent  pressure, friction and shearing   11. Encourage use of lotion/moisturizer on skin  12. Monitor patient's hygiene practices  13. Consult/collaborate with wound care nurse   14. Utilize specialty bed  15. Consider placing an indwelling catheter if incontinence interferes with healing of stage 3 or 4 pressure injury  Outcome: Progressing  Goal: Nutritional status is improving  Description  Interventions:  1. Assist patient with eating   2. Allow adequate time for meals   3. Encourage patient to take dietary supplement(s) as ordered   4. Collaborate with Clinical Nutritionist  5. Include patient/patient care companion in decisions related to nutrition  Outcome: Progressing     Problem: Moderate/High Fall Risk Score >5  Goal: Patient will remain free of falls  Outcome: Progressing     Problem: Compromised Hemodynamic Status  Goal: Vital signs and fluid balance maintained/improved  Description  Interventions:  1. Position patient for maximum circulation / cardiac output  2. Monitor and assess vitals and  hemodynamic parameters with position changes  3. Monitor and compare daily weight  4. Monitor intake and output.  Notify LIP if urine output is less than 30 mL/hour   5. Monitor/assess lab values and report abnormal values  Outcome: Progressing     Problem: Impaired Mobility  Goal: Mobility/Activity is maintained at optimal level for patient  Description  Interventions:  1. Increase mobility as tolerated/progressive mobility  2. Encourage independent activity per ability  3. Maintain proper body alignment  4. Perform active/passive ROM  5. Plan activities to conserve energy, plan rest periods  6. Reposition patient every 2 hours and as needed unless able to reposition self  7. Assess for changes in respiratory status, level of consciousness and/or development of fatigue  8. Consult/collaborate with Physical Therapy and/or Occupational Therapy  Outcome: Progressing     Problem: Peripheral Neurovascular Impairment  Goal:  Extremity color, movement, sensation are maintained or improved  Description  Interventions:  1. Increase mobility as tolerated/progressive mobility  2. Assess and monitor application of corrective devices (cast, brace, splint); check skin integrity  3. Assess extremity for proper alignment  4. Teach/review/reinforce ankle pump exercises  5. VTE Prevention: Administer anticoagulant(s) and/or apply anti-embolism stockings/devices as ordered  Outcome: Progressing     Problem: Compromised skin integrity  Goal: Skin integrity is maintained or improved  Description  Interventions:  1. Assess Braden Scale every shift  2. Turn or reposition patient every 2 hours or as needed unless able to reposition self  3. Increase activity as tolerated/progressive mobility  4. Relieve pressure to bony prominences  5. Avoid shearing   6. Keep skin clean and dry   7. Encourage use of lotion/moisturizer on skin   8. Monitor patient's hygiene practices   9. Collaborate with Wound, Ostomy, and Continence Nurse  10. Utilize specialty bed  11. Keep head of bed 30 degrees or less (unless contraindicated)  Outcome: Progressing     Problem: Nutrition  Goal: Nutritional intake is adequate  Description  Interventions:  1. Monitor daily weights  2. Assist patient with meals/food selection  3. Allow adequate time for meals  4. Encourage/perform oral hygiene as appropriate   5. Encourage/administer dietary supplements as ordered (i.e. tube feed, TPN, oral, OGT/NGT, supplements)  6. Consult/collaborate with Clinical Nutritionist  7. Include patient/patient care companion in decisions related to nutrition  8. Assess anorexia, appetite, and amount of meal/food tolerated  9. Consult/collaborate with Speech Therapy (swallow evaluations)  Outcome: Progressing

## 2018-09-18 NOTE — Progress Notes (Signed)
Pharmacy IV to PO Conversion Note  Due to a critical drug shortage, doxycycline  Previous IV dose: 100mg  IV Q12H scheduled   New oral dose: IV dose once and then doxycycline 100mg  PO Q12H scheduled to start tonight at 2100.      1.  The patient met the following conversion criteria and can receive oral therapy.    Able to eat or tolerate enteral feeding OR   Receiving enteral nutrition by the oral, gastric, or nasogastric tube OR   Receiving other scheduled medications by the oral route    2.  Patient does not have any of the following:    Inability to swallow, refuses oral medication or is strict NPO   Malabsorption syndrome   Unresolved ileus or complete bowel obstruction   Active GI bleed (pantoprazole and famotidine only)   Seizures (lorazepam only)   Myxedema coma (levothyroxine only)   Organ donation (levothyroxine only)    If you have any questions, please contact the pharmacist at [phone].    Thank you!  [facility] Pharm[facility] Department [phone]

## 2018-09-18 NOTE — Progress Notes (Signed)
Pharmacy Vancomycin Dosing Consult Note  Mccade Sullenberger Sr.    Assessment:   Day #1 vancomycin cellulitis     Plan:   Vancomycin 1500 mg IV q24H  Trough level 05/25  Pharmacy will follow the patient's renal function, vancomycin levels, and dosing during the course of therapy. If you have any questions, please contact the pharmacist at [phone]     Indication: cellulitis  Goal trough: 10-15    Age: 73 y.o.  Height: 1.829 m (6')  Weight:  104.3 kg (230 lb)  IBW: 77 kg  DW:88 kg     Baseline Population Estimate Kinetics:    SCr: 1.75 mg/dL    CrCl: 47 ml/min    Ke: 0.04341        t50: 16 hrs        Vd: 62 L                        Historic Patient Regimen   Date Regimen Weight SCr CrCl Trough Level Adjustments to Dose              Current Patient Regimen   Date Regimen Trough Date/Time Trough Level Pt Specific Ke                                 Cultures   Date Source Organism Sensitivities Resistance                                          Recent Labs   Lab 09/17/18  1542 09/14/18  1628   Creatinine 1.75* 1.58*   BUN 27* 29*   WBC 16.2* 9.6

## 2018-09-19 LAB — CBC AND DIFFERENTIAL
Basophils %: 0.4 % (ref 0.0–3.0)
Basophils Absolute: 0 10*3/uL (ref 0.0–0.3)
Eosinophils %: 2.6 % (ref 0.0–7.0)
Eosinophils Absolute: 0.3 10*3/uL (ref 0.0–0.8)
Hematocrit: 30.9 % — ABNORMAL LOW (ref 39.0–52.5)
Hemoglobin: 10.4 gm/dL — ABNORMAL LOW (ref 13.0–17.5)
Lymphocytes Absolute: 1 10*3/uL (ref 0.6–5.1)
Lymphocytes: 9.2 % — ABNORMAL LOW (ref 15.0–46.0)
MCH: 33 pg (ref 28–35)
MCHC: 34 gm/dL (ref 32–36)
MCV: 97 fL (ref 80–100)
MPV: 7.3 fL (ref 6.0–10.0)
Monocytes Absolute: 0.9 10*3/uL (ref 0.1–1.7)
Monocytes: 8.2 % (ref 3.0–15.0)
Neutrophils %: 79.6 % — ABNORMAL HIGH (ref 42.0–78.0)
Neutrophils Absolute: 8.8 10*3/uL — ABNORMAL HIGH (ref 1.7–8.6)
PLT CT: 240 10*3/uL (ref 130–440)
RBC: 3.18 10*6/uL — ABNORMAL LOW (ref 4.00–5.70)
RDW: 13 % (ref 11.0–14.0)
WBC: 11.1 10*3/uL — ABNORMAL HIGH (ref 4.0–11.0)

## 2018-09-19 LAB — BASIC METABOLIC PANEL
Anion Gap: 12.5 mMol/L (ref 7.0–18.0)
BUN / Creatinine Ratio: 18.8 Ratio (ref 10.0–30.0)
BUN: 32 mg/dL — ABNORMAL HIGH (ref 7–22)
CO2: 22 mMol/L (ref 20–30)
Calcium: 8.1 mg/dL — ABNORMAL LOW (ref 8.5–10.5)
Chloride: 105 mMol/L (ref 98–110)
Creatinine: 1.7 mg/dL — ABNORMAL HIGH (ref 0.80–1.30)
EGFR: 39 mL/min/{1.73_m2} — ABNORMAL LOW (ref 60–150)
Glucose: 157 mg/dL — ABNORMAL HIGH (ref 71–99)
Osmolality Calculated: 282 mOsm/kg (ref 275–300)
Potassium: 3.5 mMol/L (ref 3.5–5.3)
Sodium: 136 mMol/L (ref 136–147)

## 2018-09-19 MED ORDER — DOXYCYCLINE HYCLATE 100 MG PO CAPS
100.00 mg | ORAL_CAPSULE | Freq: Two times a day (BID) | ORAL | 0 refills | Status: AC
Start: 2018-09-19 — End: 2018-09-24

## 2018-09-19 MED ORDER — HYDROCODONE-ACETAMINOPHEN 5-325 MG PO TABS
1.0000 | ORAL_TABLET | ORAL | 0 refills | Status: AC | PRN
Start: 2018-09-19 — End: 2018-09-26

## 2018-09-19 NOTE — Progress Notes (Signed)
NURSE NOTE SUMMARY  [facility] - 4TH SURGICAL   Patient Name: Christopher Dickerson, Christopher SR.   Attending Physician: Jonn Shingles, MD   Today's date:   09/19/2018 LOS: 0 days   Shift Summary:                                                              Patient was just discharged home, left with transport team. Discharge instructions and scripts were given to patient.   Provider Notifications:      Rapid Response Notifications:  Mobility:      PMP Activity: Step 5 - Chair (09/19/2018 12:06 PM)     Weight tracking:  Family Dynamic:   Last 3 Weights for the past 72 hrs (Last 3 readings):   Weight   09/17/18 1533 104.3 kg (230 lb)          Health Care Agent: Victorino December - Daughter - [phone]     Recent Vitals Last Bowel Movement   BP: (!) 162/93 (09/19/2018  7:37 PM)  Heart Rate: 78 (09/19/2018  7:37 PM)  Temp: 99.1 F (37.3 C) (09/19/2018  7:37 PM)  Resp Rate: 17 (09/19/2018  7:37 PM)  SpO2: 96 % (09/19/2018  7:37 PM)   Last BM Date: 09/15/18

## 2018-09-19 NOTE — Progress Notes (Signed)
JUSTIFICATION OF AMBULANCE TRANSPORT   PHYSICIAN CERTIFICATION STATEMENT    MEDICARE REGULATIONS REQUIRE THE PHYSICIAN OR HEALTHCARE PROFESSIONAL TO COMPLETE THIS CERTIFICATION PRIOR TO ALL NON-EMERGENCY TRANSPORTS FOR MEDICARE RECIPIENTS. ALL STATEMENTS MUST MATCH THE PATIENT'S MEDICAL RECORD.     PATIENT NAME:  Christopher Dickerson, Christopher Sr.  DATE:  09/19/2018  ATTENDING PROVIDER:  Jonn Shingles, MD    What condition(s) and/or illness(es) does this patient have that prevents him/her from being transported by any means other than an ambulance?    CHIEF COMPLAINT: LEG SWELLING    ADDITIONAL DX (if applicable): wheel chair /bed bound / inability to stand    TRANSPORT DESTINATION: Home     Specialist/Specialty not available at [facility]. Reason for transfer:  Discharge home     YES    YES     Bed Confined- At the time of the transport: (all three conditions must apply)   .unable to get up from bed without assistance; and  .unable to ambulate; and  .unable to sit in a wheelchair for the duration of the transport     -OR-     YES      Other means of transportation are contraindicated because it would be harmful to the patient's condition. Even if no other means of transportation are available, ambulance trips must be medically necessary and not for convenience. Significant medical documentation must accompany these claims.      Electronically signed by: Maple Mirza, RN  Date/Time: 09/19/2018 9:56 PM      PHYSICIAN CERTIFICATION IS GOOD 60 DAYS FROM DATE OF PHYSICIAN'S SIGNATURE - Updated 10/07/11

## 2018-09-19 NOTE — Discharge Instructions (Signed)
Hematoma  A hematoma is a collection of blood trapped outside of a blood vessel. It is what we think of as a bruise or a contusion. It is usually seen under the skin as a black and blue spot on your arm or leg, or a bump on your head after an injury. It can be almost anywhere on or in your body. It can also occur in an internal organ where it can be more serious.   A hematoma is caused by an injury with damage to small blood vessels. This causes blood to leak into the tissues. Blood forms a pocket under the skin that swells and looks like a purplish patch. Hematomas sometimes form under the skin from bleeding during childbirth and can be particularly serious. Another serious form of hematoma forms after a fall on the head, called a subdural hematoma.   Gradually the blood in the hematoma is absorbed back into the body. The swelling and pain of the hematoma will go away. This takes from1 to4 weeks, depending on the size of the hematoma. The skin over the hematoma may turn bluish then brown and yellow as the blood is dissolved and absorbed. Usually, this only takes a couple of weeks but can last months.   Home care   Limit motion of the joints near the hematoma. If the hematoma is large and painful, avoid sports and other vigorous physical activity until the swelling and pain goes away.   Apply an ice pack (ice cubes in a plastic bag, or a frozen bag of peas, wrapped in a thin towel) over the injured area for 20 minutes every 1 to 2 hours the first day. Continue with ice packs 3 to 4 times a day for the next2 days. Continue the use of ice packs for relief of pain and swelling as needed.   If you need anything for pain, you can take acetaminophen, unless you were given a different pain medicine to use.Talk with your healthcare provider before using this medicine if you have chronic liver or kidney disease. Also talk with your healthcare provider if you havehad a stomach ulcer ordigestive tractbleeding, or  are taking blood-thinner medicines.    Follow-up care  Follow up with yourhealthcare provider,or as advised.If X-rays or a CT scan were done, you will be notified if there is a change in the reading, especially if it affects treatment.   When to seek medical advice  Call your healthcare provider right away if any of the following occur:   Redness around the hematoma   Increase in pain or warmth in the hematoma   Increase in size of the hematoma   Fever of 100.4F (38C) or higher, or as directed by your healthcare provider   If the hematoma is on the arm or leg, watch for:  ? Increased swelling or pain in the extremity  ? Numbness or tingling or blue color of the hand or foot  [employer] last reviewed this educational content on 07/28/2016   2000-2020 [employer] [address], [place] [address]. All rights reserved. This information is not intended as a substitute for professional medical care. Always follow your healthcare professional's instructions.

## 2018-09-19 NOTE — Discharge Instr - AVS First Page (Signed)
HOME HEALTH     [facility] has been arranged to see you for home health care. The home health nurse will call you to arrange your first visit once you are discharged. If you have any questions prior to the visit please call the home health agency at [phone].    Wheelchair has been ordered through [employer] [phone]. Please call them with any questions regarding delivery/pickup.

## 2018-09-19 NOTE — Consults (Addendum)
Readmission Risk  [facility] - 4TH SURGICAL   Patient Name: Christopher Dickerson, Christopher SR.   Attending Physician: Jonn Shingles, MD   Today's date:   09/19/2018 LOS: 0 days   Expected Discharge Date Expected Discharge Date: 09/19/18    Readmission Assessment:                                                              Discharge Planning  Expected Discharge Date: 09/19/18  Does the patient have perscription coverage?: Yes  Confirmed PCP with Pt: Yes  Confirmed PCP name: Christopher Dickerson  DME anticipated at discharge: 3-in-1 BSC, Wheelchair-manual  Anticipated Home Health at [place]: Yes  Name of Home Health Agency Placement: [facility]  Referral made for home health RN visit?: Yes  Anticipated Placement at : No    CM Comments: 5/23 RNCM: Adm 5/21 R calf hematoma. No surgery planned. Ice and elevation. Per PT, wheelchair and BSC recommended. Pt has rollator at home. Spoke with pt and friend Christopher Dickerson, [place].  Son will be available next 2 days but will be going back to work eventually. Per Christopher Dickerson, family can provide meals. Pt requests HH, order recieved. Pt has no preference to [facility] agencies and referral sent to [facility]. BSC delivered to room and wheelchair ordered through [employer] and to arrange delivery/pickup.     1555 Pt/friend request stretcher transport d/t not being able to walk, stand and leg painful. Discussed that Medicare will be billed but I cannot guarantee coverage. VMT stretcher arranged for 6 pm.       Health Care Agent: Christopher Dickerson - Daughter - [phone]   IDPA:   Patient Type  Within 30 Days of Previous Admission?: No  Healthcare Decisions  Interviewed:: Patient, Other (Comment)(Spoke with family friend, Christopher Dickerson SW on phone per pt's request)  Orientation/Decision Making Abilities of Patient: Alert and Oriented x3, able to make decisions  Advance Directive: Patient has advance directive, copy not in chart  Advance Directive not in Chart: Copy requested from family/decision  maker  Healthcare Agent Appointed: No  Prior to admission  Prior level of function: Independent with ADLs  Type of Residence: Private residence  Home Layout: Two level, Able to live on main level with bedroom/bathroom, Stairs to enter without rails (add number in comment)(3 steps)  Have running water, electricity, heat, etc?: Yes  Living Arrangements: Children(Son)  How do you get to your MD appointments?: Self/family  How do you get your groceries?: Self/family  Who fixes your meals?: Self  Who does your laundry?: Self/family  Who picks up your prescriptions?: Self/family  Dressing: Needs assistance  Grooming: Needs assistance  Feeding: Independent  Bathing: Needs assistance  Toileting: Needs assistance  DME Currently at Home: Dan Humphreys, [employer]  Discharge Planning  Support Systems: Children, Friends/neighbors  Patient expects to be discharged to:: Home  Mode of transportation:: Other(VMT Doctor, general practice)  Does the patient have perscription coverage?: Yes  Consults/Providers  PT Evaluation Needed: Yes (Comment)  OT Evalulation Needed: Yes (Comment)  SLP Evaluation Needed: No      30 Day Readmission:       Provider Notifications:      Berdine Addison, RN, MSN  Case Manager  [phone]

## 2018-09-19 NOTE — Discharge Summary (Signed)
DISCHARGE SUMMARY - [facility]    Patient Name: Christopher Dickerson, Christopher SR.  Attending Physician: Jonn Shingles, MD  Primary Care Physician: Gerilyn Pilgrim, MD    Date of Admission: 09/17/2018  Date of Discharge: 09/19/2018  Length of Stay in the Hospital: 0    Discharge Diagnoses:                                                                          Primary Diagnosis:  R. Calf hematoma w/  3rd Degree Gastrocnemius Tear     Secondary Diagnoses:  COPD  Afib  HTN  w/ history of LV Thrombus  Depression  Obesity    Discharge Medications:                                                                            Medication List      START taking these medications    doxycycline 100 MG capsule  Commonly known as:  VIBRAMYCIN  Take 1 capsule (100 mg total) by mouth 2 (two) times daily for 5 days     HYDROcodone-acetaminophen 5-325 MG per tablet  Commonly known as:  NORCO  Take 1 tablet by mouth every 4 (four) hours as needed for Pain        CONTINUE taking these medications    albuterol 108 (90 Base) MCG/ACT inhaler  Commonly known as:  PROVENTIL HFA     amiodarone 200 MG tablet  Commonly known as:  PACERONE     furosemide 40 MG tablet  Commonly known as:  LASIX     metoprolol tartrate 25 MG tablet  Commonly known as:  LOPRESSOR  Take 1 tablet (25 mg total) by mouth 2 (two) times daily     mirtazapine 7.5 MG tablet  Commonly known as:  REMERON     Vitamin C 500 MG Chew     vitamin E 400 UNIT capsule        STOP taking these medications    Xarelto 15 MG Tabs  Generic drug:  rivaroxaban           Where to Get Your Medications      You can get these medications from any pharmacy    Bring a paper prescription for each of these medications   doxycycline 100 MG capsule   HYDROcodone-acetaminophen 5-325 MG per tablet         Consultations:                                                                                       Orthopedic Surgery    Labs:  Recent Labs   Lab 09/19/18  0934 09/17/18  1542 09/14/18  1628   WBC 11.1* 16.2* 9.6   RBC 3.18* 3.95* 4.38   Hemoglobin 10.4* 12.7* 14.2   Hematocrit 30.9* 38.3* 42.1   MCV 97 97 96   PLT CT 240 271 229       Recent Labs   Lab 09/19/18  0934 09/17/18  1542 09/14/18  1628   Sodium 136 140 140   Potassium 3.5 4.2 4.3   Chloride 105 102 104   CO2 22 27 27    BUN 32* 27* 29*   Creatinine 1.70* 1.75* 1.58*   Glucose 157* 166* 112*   Calcium 8.1* 8.6 8.9       Recent Labs   Lab 09/17/18  1542   ALT 16   AST (SGOT) 19   Bilirubin, Total 1.0   Albumin 3.8   Alkaline Phosphatase 85             Imaging studies:                                                                                       Mri Tibia Fibula Right W Wo Contrast    Result Date: 09/17/2018  1.  Grade 3 muscle tear of the medial head of the right gastrocnemius muscle. 2.  Small simple right Baker's cyst. ReadingStation:[id number]    US Venous Low Extrem Duplx Dopp Uni Right    Result Date: 09/14/2018  No evidence of deep vein thrombosis right leg. There is a 3.9 x 1.6 x 2.1 cm complex fluid collection at the right posterior fossa. ReadingStation:[id number]      Culture results:                                                                                         Microbiology Results     Procedure Component Value Units Date/Time    Blood Culture - Venipuncture # 1 [[id number]] Collected:  09/17/18 1710    Specimen:  Blood from Venipuncture Updated:  09/19/18 0640    Narrative:       Specimen/Source: Blood/Venipuncture  Collected: 09/17/2018 17:10     Status: Valued      Last Updated: 09/19/2018 06:38                Culture Result (Prelim)      No Growth To Date                Hospital Course:  For full details of the patient's admission please consult the whole medical record including daily progress notes, history and physical, consult  notes, lab reports as well as imaging studies.  Briefly:    Pt. is a 72/M w/ PMhx of afib w/ L. ventricular apical thrombus and COPD. He presented to the ED d/t pain and swelling of R. LE. [place] on Monday was negative for DVT. When pain continued he was evaluated w/ MRI on admission and found to have gastrocnemius tear w/ hematoma and WBC elevation. Orthopedic surgery evaluated patinetn ad recommended rest, ie, and elevation w/o surgial treatment. Also recommended holding anticoagulation in order for hematoma to heal.     Monitoring for 24hr on these recommendations w/ some improvement. Erythema significantly cleared and  PT/OT evaluation suggests patient stable for d/c to home w/ spervision. Pt. prepped for d/c w/ completion of abx and pain control.          Discharge Day Exam:  Vitals:  T:100 F (37.8 C) (Temporal)   BP:(!) 167/94, HR:81, RR:20, SaO2:95%  General:             Awake, Alert. No Acute Distress.  HEENT:              Normocephalic Atraumatic. Mucus Membranes Moist, EOMI, PERRL  Cardiovascular:  RRR. Normal Heart Sounds. Brisk cap refill  Respiratory:        CTAB. No rales, rhonchi or retractions.  Abdomen/GI:      Nontender, nondistended. Normoactive BS  Integument:        Clear. No erythema of R. LE  Extremities:        Expected degrees of motion in all 4 extremities. Mild, tnede nonpitting swelling of R. LE w/o fluid load.  Neurologic:         No focal neurologic abnormalities or gross focal deficit  Psychiatric:         AAOx3, cooperative.           Weight Monitoring 05/24/2018 05/25/2018 05/26/2018 05/27/2018 06/29/2018 09/14/2018 09/17/2018   Height - - - - 182.9 cm - 182.9 cm   Height Method - - - - - - Stated   Weight 87.5 kg 87.2 kg 85.6 kg 85.4 kg 96.616 kg 114 kg 104.327 kg   Weight Method Standing Scale Standing Scale Standing Scale Standing Scale - Actual Estimated   BMI (calculated) - - - - 28.9 kg/m2 - 31.3 kg/m2           Discharge condition: stable    Discharge instructions:                                                                              Activity: As Tolerated    Discharge Instructions/ Follow up:  Pt. is to keep the appointments as listed below.    PCP in 1wk to discuss resuming Xarelto if able.      Time spent coordinating discharge and reviewing discharge plan: 42 minutes    Signed by:   Jonn Shingles  09/19/2018  1:18 PM    [facility]  Office number [phone]    CC: Gerilyn Pilgrim, MD

## 2018-09-19 NOTE — Progress Notes (Signed)
Better   99.5 temp  Wbc 16  Pe: calf softer  Able to flex and extend ankle comfortably  A/Pcont current care  No plans for surgery

## 2018-09-19 NOTE — PT Eval Note (Signed)
ATTENTION Provider(s):  Thank you for allowing [place] to participate in the care of  Christopher Tavano Sr..  Regulations from the Center for Medicare and Medicaid Services (CMS) require your review and approval for this Plan of Care.  Please co-sign this note indicating you are in agreement with the Physical Therapy Plan of Care.    [facility]  Patient: Christopher Dickerson.     CSN: [id number]    Bed: 446/446-A  Physical Therapy EVALUATION  Visit#: 1   Treatment Frequency: 6-7x/wk  Last seen by a physical therapist vs. Physical therapist assistant: 09/19/2018    PT Assessment:  PATIENT HAS BEEN VERY LIMITED PRIOR TO ADMISSION DUE TO SEVERE RIGHT LEG PAIN. USUALLY INDEPENDENT IN ADL'S AND MOBILITY. CURRENTLY MOD I FOR BED MOBILITY. MIN/MOD A FOR TRANSFERS, AND MIN A FOR VERY LIMITED AMB WITH A WHEELED WALKER. INDEPENDENT IN W/C USE. UNABLE TO PERFORM STAIRS DUE TO PAIN. COULD MANAGE AT HOME, IF THE SON IS ABLE TO ASSIST THROUGHOUT THE DAY.    DISCHARGE RECOMMENDATIONS   Discharge Recommendations:   Home with supervision       *Discharge recommendations are subject to change based on patient's progress and/or home support changes - please refer to most recent PT note for current recommendation    DME recommended for Discharge:   Wheelchair - manual  Bedside commode    PMP (Progressive Mobility Program) Recommendations:   Recommend patient  dangle edge of bed 3 times/day with physical assist and/or supervision of 1 staff as tolerated.     Precautions and Contraindications:   Falls    PT Assessment and Plan of Care (Treatment frequency noted above):     HPI (per physician charting) and Pertinent Medical Details:  Admitted 09/17/2018 with/for "    Goals:    LTGs: (By d/c to home)  1. Patient will perform sit to stand transfers independently with wheeled walker in prep for gait. NEW   2. Patient will ambulate 20 feet  independently with wheeled walker in prep for home mobility. NEW   3. Patient  will ascend/descend 3 step(s) with RAIL AND MIN A in order to demonstrate ability to enter home. NEW   4. Patient and/or caregiver will demonstrate independence with LE HEP in order to improve ROM and strength in prep for transfers and gait. NEW    Patient presenting with the following PT Impairments:decreased ROM, decreased strength, decreased safety/judgement during functional mobility, decreased activity tolerance, impaired coordination, decreased functional mobility, decreased balance, gait deficits, pain    Patient will benefit from skilled PT services in order to MAXIMIZE MOBILITY, ROM, STRENGTH, AND ACTIVITY TOLERANCE     Treatment/interventions: Exercise, Gait training, Stair training, Functional transfer training, LE strengthening/ROM, Patient/caregiver training, Bed mobility    Due to the presence of a limited number of treatment options and 1-2 comorbidities or personal factors that affect performance, as well as patient's evolving clinical presentation with changing characteristics, minimal to moderate modifications of mobility and/or assistance were necessary to complete evaluation when examining total of 4 or more elements (includes body structures and functions, activity limitations and/or participation restrictions) determines the degree of complexity for this patient is LOW    Rehabilitation Potential:Good    Discussed risk, benefits and Plan of Care with: Patient    *note: Clinical Presentation and Decision Making includes the following sections: Goals, PT assessment, treatment frequency and treatment/interventions):    History Based on physician charted EPIC/EMR information:     Medical  Diagnosis: Cellulitis of leg, right [L03.115]  Gastrocnemius muscle tear, right, initial encounter [S86.111A]  Leg hematoma, right, initial encounter [S80.11XA]  Hematoma of leg, right, initial encounter [S80.11XA]    Problem list:  Patient Active Problem List   Diagnosis    Abdominal pain    Hypertension     Atrial fibrillation    Nausea    Atrial fibrillation with RVR    CHF (congestive heart failure)    SOB (shortness of breath)    Noncompliance with medication regimen    Hypertension, unspecified type    Paroxysmal atrial fibrillation    Acute on chronic systolic congestive heart failure    Leg hematoma, right, initial encounter        Past Medical/Surgical History:  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    Secondary to Atrial Fib      Past Surgical History:   Procedure Laterality Date    APPENDECTOMY      COLONOSCOPY, POLYPECTOMY  12/16/2012    Procedure: COLONOSCOPY, POLYPECTOMY;  Surgeon: Gwenith Spitz, MD;  Location: [facility];  Service: Gastroenterology;  Laterality: N/A;    EGD, BIOPSY  12/16/2012    Procedure: EGD, BIOPSY;  Surgeon: Gwenith Spitz, MD;  Location: [facility];  Service: Gastroenterology;  Laterality: N/A;    KNEE ARTHROSCOPY W/ ACL RECONSTRUCTION      LIFT, ARM (MEDICAL)      RIGHT & LEFT HEART CATH N/A 05/20/2018    Procedure: RIGHT & LEFT HEART CATH;  Surgeon: Rudean Curt, MD;  Location: [facility] CATH[facility]EP;  Service: Cardiovascular;  Laterality: N/A;  total heart cath 05/20/18       Social History    Information per Patient:    Home Living Arrangements:  Living Arrangements: SON  Assistance Available: Part time  Type of Home: House  Home Layout: Two level, with 3 stair(s) to enter, 1 rail(s), Able to live on main level with bedroom and bathroom, Basement    Prior Level of Function:  Community ambulation  Mobility:  Independent with  No assistive device  Additional comments: I ADL'S. RETIRED.  Fall history: NONE    DME available at home:  Rollator (4 wheeled walker)    Subjective   "THIS HURTS TOO MUCH TO WALK, DOC. I GOTTA SIT DOWN. I DON'T KNOW IF THERE'S ENOUGH ROOM TO USE  A W/C IN MY HOUSE."    Patient/family/caregiver consent to therapy session is noted by the participation in the therapy session.    Patient/caregiver goal for PT: DECREASE PAIN AND RETURN HOME.    Pain:  At Rest: 6/10  With Activity: 10/10  Location: Leg:  right  Interventions: Medication (see eMAR), Cold applied, Repositioned, Rest    Examination of Body Systems (Structures, Function, Activity and Participation)   Patients medical condition is appropriate for Physical therapy intervention at this time    Observation of patient:  Patient is in bed with telemetry, SCD's     Cognition:  Oriented to: Oriented x4  Command following: Follows ALL commands and directions without difficulty  Alertness/Arousal: Appropriate responses to stimuli   Attention Span:Appears intact  Memory: Appears intact  Safety Awareness: minimal verbal instruction  Insights: Decreased awareness of deficits  Problem Solving: Assistance required to identify errors made, minimal assistance  Assessment of new learning ability:  good     Vital Signs (Cardiovascular):  STABLE WITHOUT SYMPTOMS    Edema: RIGHT LEG  Skin Inspection: ECCHYMOSIS RIGHT LEG  Sensation: intact , to light touch     Balance:  Static Sitting:  WFL  Dynamic Sitting:  WFL  Static Standing:  Fair  Dynamic Standing:  Fair-            Musculoskeletal Examination:            Range of motion:  Right LE: WFL except: KNEE EXT SITTING -20. MOVEMENTS WERE SLOW AND GUARDED WITH C/O CALF PAIN.  Left LE: Grossly WFL       Strength:  Right LE: Grossly HIP AND KNEE 3/5 WITH PAIN. ANKLE 4/5  Left LE: Grossly WFL    Tone:         Functional Mobility:    Bed Mobility:  Supine to Sit:   Modified independence .   HOB elevated, Bed rail used, PATIENT USED LLE TO ASSIST RLE OOB.  Sit to Supine:   Modified independence .   HOB elevated, Bed rail used, PATIENT USED LLE TO ASSIST RLE MOVEMENT INTO BED.    Transfers:  Sit to Stand:  Minimal assist FROM HIGHER LEVEL OF BED, MOD A FROM LOWER LEVEL OF W/C. with Front wheeled  walker.    Cues for Sequencing, Cues for Hand Placement, Cues for Walker Management  Stand to Sit:  Supervision.    Cues for Sequencing, Cues for Hand Placement, Cues for Walker Management, INITIALLY EXHIBITED DECREASED CONTROL, BUT IMPROVED WITH CUES.    Locomotion:  LEVEL AMBULATION:  Distance: 4'   Assistance level:  Minimal assist  Device:  Front wheeled walker  Pattern:  Step to, Decreased cadence, Decreased step length:  bilaterally, Decreased stance time:  right, Flexed knee(s):  right, MINIMAL WB RLE. UNSTEADY WITHOUT LOB. PATIENT WAS SELF-LIMITING IN AMB DUE TO RIGHT LEG PAIN.     STAIRS: DEFERRED DUE PERFORMANCE WITH AMB.    W/C MOBILITY: PATIENT INDEPENDENTLY PROPELLED W/C 100' WITH BUE'S. ABLE TO MANUEVER AROUND OBSTACLES AND IN CONFINED SPACES.       Treatment Interventions this session:   Evaluation  Therapeutic activity  Gait training  W/C TRAINING  Patient/family/caregiver education    Education Provided:   TOPICS: role of physical therapy, plan of care, goals of therapy and safety with mobility and ADLs, benefits of activity, home safety     Learner educated: Patient  Method: Explanation and Demonstration  Response to education: Verbalized understanding and Needs reinforcement    Patient Position at End of Treatment:   Supine, in bed, Needs in reach, No distress, Affected Extremity elevated, SCD's/foot pumps applied and Ice applied    Team Communication:     Spoke to: RN, MD  Regarding: Pre-session re: patient status, Patient position at end of session, Discharge needs, Patient participation with Therapy  Whiteboard updated: No  PT/PTA communication: via written note and verbal communication as needed.    Time of treatment:  Time Calculation  PT Received On: 09/19/18  Start Time: 1132  Stop Time: 1206  Time Calculation (min): 34 min    Maryann Alar

## 2018-09-23 ENCOUNTER — Ambulatory Visit: Payer: Medicare PPO

## 2018-09-23 ENCOUNTER — Other Ambulatory Visit: Payer: Self-pay | Admitting: Cardiovascular Disease

## 2018-09-23 DIAGNOSIS — I519 Heart disease, unspecified: Secondary | ICD-10-CM

## 2018-09-23 DIAGNOSIS — I5023 Acute on chronic systolic (congestive) heart failure: Secondary | ICD-10-CM

## 2018-09-24 ENCOUNTER — Encounter: Payer: Self-pay | Admitting: Cardiovascular Disease

## 2018-09-24 NOTE — Progress Notes (Signed)
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 In 04/2018, he was admitted with CHF. Cath showed nonobstructive CAD. Was started on amio and BB for the afib. He was Dx on lifevest.    Repeat echo showed EF 25-30%  The patient says he is off ETOH     We discussed the indication for ICD   He had missed his last appointment because he showed up at the wrong building     We will schedule with PA this week with EKG to make sure not in Afib. If not, we will reduce amio to once a day  proceed with ICD. If he is in Afib we will [place] amio, and try rate control with BB     ACEI/aldactone is not used 2/2 CKD   On his visit we will try to max BB before adding hydralazine/imdure    Will schedule with me in 4 weeks

## 2018-09-25 ENCOUNTER — Telehealth: Payer: Self-pay | Admitting: Cardiovascular Disease

## 2018-09-25 NOTE — Telephone Encounter (Signed)
Daughter Tresa Endo called and left message on results of stresstest, I did leave her a message that he had an Echo and does have EF that is low and is coming in June 3rd to dicuss with Katie or posssible ICD, Daughter Tresa Endo did call back and I discuss this on the phone with her also , She is going to make sure a family member will be able to be present at the visit

## 2018-09-30 ENCOUNTER — Telehealth: Payer: Self-pay | Admitting: Physician Assistant

## 2018-09-30 ENCOUNTER — Telehealth: Payer: Medicare PPO | Admitting: Physician Assistant

## 2018-09-30 ENCOUNTER — Encounter: Payer: Self-pay | Admitting: Physician Assistant

## 2018-09-30 NOTE — Telephone Encounter (Signed)
Pt's daughter, Tresa Endo, called in and left VM on nurse line questioning if the pt's appt happened today. I called back and let her know that you all tried to contact him for his appt and had no answer and were unable to leave a vm. Tresa Endo stated that she has been trying to get in contact with him as well and has had no luck. She would like to have someone call her or her father tomorrow to reschedule his appt.    Thanks,    [place] A Vivia Rosenburg, CNA

## 2018-09-30 NOTE — Progress Notes (Signed)
Our office attempted to call the patient for his telehealth appointment. He did not answer, and his VM was full.     Barron Alvine, PA-C

## 2018-10-01 NOTE — Telephone Encounter (Signed)
Please call daughter or patient and reschedule his appt    Ahill PN

## 2018-10-01 NOTE — Telephone Encounter (Signed)
appt rescheculed with Dr Monte Fantasia for Monday and daugher will assist patient with telehealth visit

## 2018-10-01 NOTE — Progress Notes (Addendum)
Received order from [employer] for life vest order forwarded to [facility] RN for Dr Manson Passey needs to be signed office note attached and fax.  This is time sensitive.  Christopher Dickerson    06.05.2020 received questions from life vest to be answered patient see Dr Monte Fantasia on 06.08.2020.  Forwarded to [facility], CNA for Dr Cyndy Freeze.  Christopher Dickerson

## 2018-10-01 NOTE — Telephone Encounter (Signed)
FYI for [employer].     Please reschedule appt. Thanks!

## 2018-10-02 ENCOUNTER — Telehealth: Payer: Self-pay

## 2018-10-02 NOTE — Telephone Encounter (Signed)
Contacted PCP for most recent office note, labs, and EKG. Updates requested in Care Everywhere.

## 2018-10-05 ENCOUNTER — Telehealth: Payer: Medicare PPO | Admitting: Cardiovascular Disease

## 2018-10-05 ENCOUNTER — Telehealth: Payer: Self-pay

## 2018-10-05 NOTE — Telephone Encounter (Signed)
Please advise 

## 2018-10-05 NOTE — Telephone Encounter (Signed)
Pt's daughter, Tresa Endo, called back in reference to pt's telehealth appt that was scheduled for this morning with Dr. Monte Fantasia.  This is the second "no show" in one week.  He also had a missed telehealth appt with Katie on 09/30/18.  Before we schedule a third appt, I thought I would reach out to you.    Pt's daughter is also requesting an update "to know what's going on with her father."  I thought that could be achieved by the telehealth appt, but since there have been two missed appts, please advise.    Victorino [phone]    Thank you!    Ronalee Belts, RN

## 2018-10-06 NOTE — Telephone Encounter (Signed)
Thank you :)

## 2018-10-06 NOTE — Telephone Encounter (Signed)
Spoke with daughter and relayed information. Pt is scheduled to see Florentina Addison in office next Tuesday.

## 2018-10-06 NOTE — Telephone Encounter (Signed)
We may add him for an other visit. Please teel the daughter if she wants an update, she will have to make sure that she will be with her father in his visit. He needs to come to the office and his daughter with him. Bring all his medications. I do not double booking this week or next week

## 2018-10-06 NOTE — Telephone Encounter (Signed)
Please see message from Dr. Monte Fantasia. Thank you!

## 2018-10-07 ENCOUNTER — Telehealth: Payer: Self-pay

## 2018-10-07 NOTE — Telephone Encounter (Signed)
•   Current PCP Ov are scanned into chart,  Current labs have been requested

## 2018-10-09 NOTE — Telephone Encounter (Signed)
Sending 2nd request for most recent records

## 2018-10-09 NOTE — Telephone Encounter (Signed)
Feb notes sent again that are already in chart. No labs were sent

## 2018-10-13 ENCOUNTER — Ambulatory Visit: Payer: Medicare PPO | Admitting: Physician Assistant

## 2018-10-13 ENCOUNTER — Encounter: Payer: Self-pay | Admitting: Physician Assistant

## 2018-10-13 VITALS — BP 120/70 | HR 68 | Ht 72.0 in | Wt 239.0 lb

## 2018-10-13 DIAGNOSIS — I429 Cardiomyopathy, unspecified: Secondary | ICD-10-CM

## 2018-10-13 DIAGNOSIS — I5023 Acute on chronic systolic (congestive) heart failure: Secondary | ICD-10-CM

## 2018-10-13 DIAGNOSIS — I2584 Coronary atherosclerosis due to calcified coronary lesion: Secondary | ICD-10-CM

## 2018-10-13 DIAGNOSIS — I480 Paroxysmal atrial fibrillation: Secondary | ICD-10-CM

## 2018-10-13 DIAGNOSIS — I10 Essential (primary) hypertension: Secondary | ICD-10-CM

## 2018-10-13 DIAGNOSIS — Z79899 Other long term (current) drug therapy: Secondary | ICD-10-CM

## 2018-10-13 MED ORDER — AMIODARONE HCL 200 MG PO TABS
200.00 mg | ORAL_TABLET | Freq: Every day | ORAL | 3 refills | Status: AC
Start: 2018-10-13 — End: ?

## 2018-10-13 MED ORDER — ASPIRIN 81 MG PO TBEC
81.00 mg | DELAYED_RELEASE_TABLET | Freq: Every day | ORAL | Status: AC
Start: 2018-10-13 — End: ?

## 2018-10-13 NOTE — Progress Notes (Signed)
[facility]                                                                                                                  Patient Name: Christopher Koranda Sr.   Date of Birth: 09-Aug-1945  Age: 73 y.o.  Gender: male    Patient Care Team:  Gerilyn Pilgrim, MD as PCP - General (Family Medicine)  Glynis Smiles, FNP as Nurse Practitioner (Family Nurse Practitioner)    Chief Complaint: Coronary Artery Disease; Cardiomyopathy (NICM); and Congestive Heart Failure      History of Present Illness   Christopher Mcwhirter Sr. is a 73 y.o. 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  This is a follow-up visit.    Events Since LOV:  - No new complaints today.   - He had a hospital visit 5/23 bc he tore a muscle in his calf and had a hematoma over the injury. His xarelto was stopped then. Subsequently has right LE swelling due to injury, no swelling in the left LE. The patient reports that his leg continues to improve and that he has been doing PT at home. No pain at this time in LE>   - No chest pain, SOB/ DOE. The patient does a lot of walking and yard work, limited by his leg pain. No cv restrictions. He can climb a flight of stiars without getting short of breath no cp.   - No dizziness, passing out, palpitations, orthopnea (he sleeps on 1 pillow).   - He avoids salt in diet.   - He quit smoking in Janurary .   - He drinks alcohol 2-3 times a week with 1-2 beers  - Repeat ECHO showed EF: 25-30%.   - Pt is unsure of the medications that he is on. He does not have his list or the bottles with him.   - He denies any s/sx suggestive of a stroke or TIA.     The patient denies chest pain, worsening DIB ( difficulty in breathing), palpitations or syncope. No  orthopnea or paroxysmal dyspnea (PND). No new symptoms suggestive of TIA or stroke. No symptoms suggestive of claudication.     No Hx of premature CAD or sudden cardiac death in the first degree relatives.    Review of Systems     CONSTITUTIONAL: There is no history of fever, weight loss or cough.  DERMATOLOGIC: The patient denies any lesions or rashes.  ENT: No history of congestion, postnasal drip, sore throat or hearing changes.  RESPIRATORY: See HPI   CARDIOVASCULAR: See HPI   GASTROINTESTINAL: No history of nausea, vomiting, diarrhea or abdominal pain.  GENITOURINARY: No history of dysuria or polyuria   MUSCULOSKELETAL: Left calf pain.   ENDOCRINE: She denies any heat or cold intolerance or excessive thirst or urination.  NEUROLOGICAL: Currently, no headache. She has no vision changes, dizziness  or fainting. No numbness or tingling.  PSYCHIATRIC: No apparent significant depression     Past Medical History     PMH 10/13/18 KW/GM  LOV 09/30/18 [place].     1- Atrial Fibrillation.   a. Patient was on Coumadin but changed to Eliquis due to history of noncompliance.   b. Echo, 03/2017: Global left ventricular systolic function is severely decreased. Estimated EF 10-15%. Global hypokinesis with base contracting little better. The right ventricular systolic function is mildly decreased. The left atrium is moderately enlarged. There is spontaneous echo contrast present in the left atrial cavity. Mobile thrombus present in the left atrial appendage. Spontaneous echo contrast seen in the left atrial appendage.   c. Echo, 03/03/2018: The left ventricle is moderate to severely dilated at 6.3 cm. Global hypokinesis with left ventricular ejection fraction severely decreased, estimated at 10-15%. The right ventricular size is mildy increased. The right ventricular systolic function is mildly decreased. The left atrium is moderately enlarged. The right atrium is mildly enlarged. There is mild aortic dilatation at the level  of the sinuses of Valsalva. Normal respiratory variation of the inferior vena cava is present. No evidence of an atrial septal defect by color doppler. Early closure of the mitral valve suggesting elevated left ventricular end-diastolic pressure.   d. TEE, 05/27/2018: The left ventricular cavity size is moderately increased. The left ventricular ejection fraction is severely decreased, estimated at 15- 20%. The left atrial appendage is severely increased in size. There is spontaneous echo contrast present in the left atrial appendage, no thrombus formation seen.          2- NICM, Systolic HF.   a. EF 10-15% on Echo November 2019.   b. THC, 04/2018: Probable nonischemic cardiomyopathy with degree of LV function out of proportion to coronary artery disease.  Etiology could be hypertensive, alcoholic, or tachycardia induced. Borderline LAD disease. Mild pulmonary hypertension  c. EF 15-20% by TEE February 2020.   d. Echo 09/23/18: Limited study for evaluation of the ventricular function. Definity contrast utilized. The left ventricular cavity size is normal. The left ventricular ejection fraction is severely decreased, estimated at 25-30%. In limited views, the right ventricular size and systolic function are normal.     3- COPD.     4- Hypertension.   5- Left Atrial thrombus, secondary to afib. 03/2017.  6- Remeron was started but discontinued as patient also on amiodarone and because prolongation of QT interval  7- NSVT, Life vest after hospitalization 04/2018.     8- Non-obstructive CAD.   a. Nuclear Stress, 05/21/18: partially reversible proximal to mid anterior wall perfusion defect. The defect demonstrates predominantly edge recovery and suggest scar with mild peri-infarct ischemia.  There is additionally a fixed inferior wall perfusion defect suggesting scar versus severe diaphragmatic attenuation.  LV systolic function is severely globally reduced with a calculated ejection function of 22%.  Summed difference  score is 5.    Past Surgical History     Past Surgical History:   Procedure Laterality Date   . APPENDECTOMY     . COLONOSCOPY, POLYPECTOMY  12/16/2012    Procedure: COLONOSCOPY, POLYPECTOMY;  Surgeon: Gwenith Spitz, MD;  Location: [facility];  Service: Gastroenterology;  Laterality: N/A;   . EGD, BIOPSY  12/16/2012    Procedure: EGD, BIOPSY;  Surgeon: Gwenith Spitz, MD;  Location: [facility];  Service: Gastroenterology;  Laterality: N/A;   . KNEE ARTHROSCOPY W/ ACL RECONSTRUCTION     . LIFT, ARM (MEDICAL)     .  RIGHT & LEFT HEART CATH N/A 05/20/2018    Procedure: RIGHT & LEFT HEART CATH;  Surgeon: Rudean Curt, MD;  Location: [facility] CATH/EP;  Service: Cardiovascular;  Laterality: N/A;  total heart cath 05/20/18       Family History     History reviewed. No pertinent family history.    Social History     Social History     Tobacco Use   . Smoking status: Former Smoker     Packs/day: 0.50     Types: Cigarettes   . Smokeless tobacco: Never Used   Substance Use Topics   . Alcohol use: Yes     Comment: occasional   . Drug use: No       Allergies     Allergies   Allergen Reactions   . Codeine        Medications     Current Outpatient Medications   Medication Sig   . albuterol (PROVENTIL HFA;VENTOLIN HFA) 108 (90 Base) MCG/ACT inhaler Inhale 2 puffs into the lungs every 6 (six) hours as needed (wheezing)   . amiodarone (PACERONE) 200 MG tablet Take 1 tablet (200 mg total) by mouth daily   . Ascorbic Acid (VITAMIN C) 500 MG Chew Tab Chew 2 each by mouth every morning   . furosemide (LASIX) 40 MG tablet Take 40 mg by mouth every morning      . metoprolol tartrate (LOPRESSOR) 25 MG tablet TAKE 1 TABLET BY MOUTH TWICE A DAY   . vitamin E 400 UNIT capsule Take 400 Units by mouth every morning   . aspirin 81 MG EC tablet Take 1 tablet (81 mg total) by mouth daily   . mirtazapine (REMERON) 7.5 MG tablet Take 7.5 mg by mouth every evening            Physical Exam     Vitals:    10/13/18 1015   BP: 120/70   Pulse: 68      Body mass index is 32.41 kg/m.  Wt Readings from Last 3 Encounters:   10/13/18 108.4 kg (239 lb)   09/17/18 104.3 kg (230 lb)   09/17/18 104.3 kg (229 lb 15 oz)      General appearance - alert, well appearing, and in no distress  Head: normocephalic, atraumatic  Mental status -  Alert and oriented,   Eyes - extraocular eye movements intact  Neck -  Supple, no thyromegaly  Chest - Clear to auscultation bilaterally      Cardiovascular system -   Regular rate and regular rhythm  +ve S1-S2  No gallop murmur or rubs  +2 pulses in the posterior tibial/ dorsalis pedis bilaterally    Neurological - alert, oriented x3, no obvious gross neurological deficits  Extremities -  no clubbing or cyanosis.  Swollen right leg from injury. No pain on palpation, no discoloration .  Psych- appropriate affect    Labs     Lab Results   Component Value Date/Time    WBC 11.1 (H) 09/19/2018 09:34 AM    RBC 3.18 (L) 09/19/2018 09:34 AM    HGB 10.4 (L) 09/19/2018 09:34 AM    HCT 30.9 (L) 09/19/2018 09:34 AM    PLT 240 09/19/2018 09:34 AM    TSH 5.03 (H) 05/27/2018 08:49 AM        Lab Results   Component Value Date/Time    NA 136 09/19/2018 09:34 AM    K 3.5 09/19/2018 09:34 AM    CL 105 09/19/2018  09:34 AM    CO2 22 09/19/2018 09:34 AM    GLU 157 (H) 09/19/2018 09:34 AM    BUN 32 (H) 09/19/2018 09:34 AM    PROT 7.9 09/17/2018 03:42 PM    ALKPHOS 85 09/17/2018 03:42 PM    AST 19 09/17/2018 03:42 PM    ALT 16 09/17/2018 03:42 PM       No results found for: CHOL, TRIG, HDL, LDL    No results found for: HGBA1CPERCNT    Cardiac testing and imaging:      EKG (10/13/2018): NSR. Right axis deviation. First degree AV block.     TEE December 2018-the global left ventricular systolic function is severely depressed with an EF of 10 to 15%.  The right ventricle systolic function is mildly decreased.  The left atrium is moderately dilated.  There is suspicion of a mobile thrombus present in the left atrial appendage.  No significant valvular  abnormalities detected    Echocardiogram November 2019-the left ventricle is moderately dilated.  The systolic function is severely depressed with an EF of 10 to 15%.  The right ventricle is mildly dilated with mildly reduced systolic function.  The left atrium is moderately dilated.  Aortic valve sclerosis is with no stenosis.  No other significant valvular functional abnormalities    TEE in January 2020-the left ventricle is moderately dilated.  EF 15 to 20%.  No thrombus detected in the left atrial appendage    Cardiac catheterization January 2020-nonobstructive CAD.50% and 60 in the proximal mid LAD.  50% in the diagonal branch.  30% in the RCA.  20% in the PDA  Diagnosis      1. Acute on chronic systolic congestive heart failure     2. Paroxysmal atrial fibrillation     3. Essential hypertension     4. Cardiomyopathy, unspecified type     5. Coronary artery disease due to calcified coronary lesion         Plan: We discussed the diagnosis and the prognosis of atrial fibrillation We discussed the signs and symptoms of bleeding and a stroke and I asked the patient to seek medical attention if either of them are suspected.    To monitor for amiodarone side effects: EKG Q 6 months; TSH/T4 Q 6 months; LFTs Q 6 months (5/21); Chest X-ray yearly (next 04/2019); PFTs (ordered) ; eye exam by ophthalmology PRN for symptoms.     Dr. Mindi Junker- Jonah Blue came and spoke to the patient personally.    The pt is unsure of the medications that he is taking, he did not bring the bottles with him today. I asked him to call the office when he gets home today to review his medication list. He needs to bring his list and medications with him to every visit.     He had a gastrocnemius tear with a hematoma on 5/23 and his xarelto was stopped and has not been restarted yet. We will start him on ASA until his hematoma complete heals, then we can restart the xarelto. I advised him to call an ortho doctor for follow up.     First degree AV block  noted on EKG pt is asymptomatic, we are reducing amio today and will continue to monitor.     We extensively discussed an ICD placement with the patient, the indications for one and the risk and benefits. We explained the risks associated with not having an ICD placement given his EF and the procedure to get an ICD.  The patient would like to think about this decision and talk to his daughter. I recommend:     1. Continue metoprolol to 25 mg twice a day (if patient made these changes LOV)  2. Decrease amiodarone to 200mg  once a day.   3. Continue on Lasix  4. Start ASA 81mg  daily, hold off on xarelto for now.   5. Follow with PCP and ortho doctor  6. We again discussed that he needs to avoid all alcohol.   7. The patient knows to call the office or seek immediate medical attention if sx arise or worsen.     Follow up in one month, we will discuss ICD placement again at this visit.      Respectfully,     Electronically signed by:   Barron Alvine, PA-C  [facility]   Phone: [phone]  Fax: [phone]

## 2018-10-13 NOTE — Patient Instructions (Addendum)
We will reduce amiodarone to 200mg  once a day.  Start aspirin 81mg  daily.   Please follow up with orthopedic doctor for your leg. If you need help finding a doctor you should call your primary care doctor (Dr. Mikael Spray).     Please call the office when you get home and give [place] an updated medication list.   We will see you in one month.   Please bring your list of medication and pill bottles with you next visit.

## 2018-10-14 ENCOUNTER — Telehealth: Payer: Self-pay

## 2018-10-14 NOTE — Progress Notes (Signed)
This encounter was created in error - please disregard.    Items noted as "reviewed" are for administrative purposes only and are not guaranteed by the provider to be accurate on this date.

## 2018-10-14 NOTE — Telephone Encounter (Addendum)
Attempted to contact patient, no answer. VM box full.     ----- Message from Barron Alvine, [place] sent at 10/14/2018 10:20 AM EDT -----  He was in the office yesterday and didn't have his medication list with him- can you please call and confirm his meds? We did make some changes yesterday (see my note).     Please remind him to get his blood work and PFTs done, thanks.

## 2018-10-14 NOTE — Telephone Encounter (Signed)
Referral sent to [facility].  Christopher Dickerson

## 2018-10-15 ENCOUNTER — Telehealth: Payer: Self-pay

## 2018-10-15 NOTE — Telephone Encounter (Signed)
Pt's daughter, Tresa Endo, called and requested to speak with Linus Galas, if possible, about pt's visit with her on 10/13/18.    It looks like there was a lot involved in that office visit; otherwise, I would've reviewed this with her myself.  She also wants to discuss "next steps."    Victorino December:  [phone]    Thank you,  Rogelia Mire, RN

## 2018-10-16 NOTE — Progress Notes (Signed)
Patient meets preliminary criteria as a [employer] beneficiary. Beneficiary notice letter mailed to patient during today's appointment.     Waylin Dorko, RN

## 2018-10-19 ENCOUNTER — Other Ambulatory Visit: Payer: Self-pay | Admitting: Cardiovascular Disease

## 2018-10-20 ENCOUNTER — Telehealth: Payer: Self-pay

## 2018-10-20 NOTE — Telephone Encounter (Signed)
Current PCP note in chart and current labs have been requested

## 2018-10-21 NOTE — Telephone Encounter (Signed)
Notes received

## 2018-10-26 ENCOUNTER — Encounter: Payer: Medicare PPO | Admitting: Cardiovascular Disease

## 2018-10-28 NOTE — Telephone Encounter (Signed)
I called and left a VM for the daughter. Thanks

## 2018-11-02 ENCOUNTER — Telehealth: Payer: Self-pay | Admitting: Physician Assistant

## 2018-11-02 NOTE — Telephone Encounter (Signed)
Please call daughter, thanks  

## 2018-11-02 NOTE — Telephone Encounter (Signed)
Called and spoke to daughter. Scheduled appt for 7/20 at 10:30 (Dr Sanjuana Letters 1st available). Daughter cannot go with him because she lives in [place] but she will send someone with him.     Also, daughter would like to speak to someone regarding his health and status. He signed form to release this information to her when needed.    Thank you, Candice

## 2018-11-02 NOTE — Telephone Encounter (Signed)
Daughter Tresa Endo called an left message on Friday that Florentina Addison was trying to reach her and she would like a call back, I see know message in Epic, phone number [phone]

## 2018-11-02 NOTE — Telephone Encounter (Signed)
I have attempted to call the patient's daughter with no answer.   The patient needs to be scheduled for a visit next week with Dr. Mindi JunkerJonah Blue.    I recommend that the patient's daughter come with him to that visit so that everyone can be present for a discussion on his management moving forward.

## 2018-11-10 ENCOUNTER — Telehealth: Payer: Self-pay

## 2018-11-10 NOTE — Telephone Encounter (Signed)
•   Current PCP Ov are scanned into chart,  Current labs have been requested

## 2018-11-12 NOTE — Telephone Encounter (Signed)
Sending 2nd request for most recent records

## 2018-11-16 ENCOUNTER — Encounter: Payer: Self-pay | Admitting: Cardiovascular Disease

## 2018-11-16 ENCOUNTER — Ambulatory Visit: Payer: Medicare PPO | Admitting: Cardiovascular Disease

## 2018-11-16 VITALS — BP 180/90 | HR 72 | Ht 72.0 in | Wt 250.5 lb

## 2018-11-16 DIAGNOSIS — I480 Paroxysmal atrial fibrillation: Secondary | ICD-10-CM

## 2018-11-16 DIAGNOSIS — I426 Alcoholic cardiomyopathy: Secondary | ICD-10-CM

## 2018-11-16 DIAGNOSIS — Z79899 Other long term (current) drug therapy: Secondary | ICD-10-CM

## 2018-11-16 DIAGNOSIS — Z7901 Long term (current) use of anticoagulants: Secondary | ICD-10-CM

## 2018-11-16 DIAGNOSIS — I5041 Acute combined systolic (congestive) and diastolic (congestive) heart failure: Secondary | ICD-10-CM

## 2018-11-16 MED ORDER — METOPROLOL SUCCINATE ER 25 MG PO TB24
50.00 mg | ORAL_TABLET | Freq: Every day | ORAL | 1 refills | Status: DC
Start: 2018-11-16 — End: 2018-12-14

## 2018-11-16 MED ORDER — LISINOPRIL 10 MG PO TABS
10.0000 mg | ORAL_TABLET | Freq: Every day | ORAL | 1 refills | Status: DC
Start: 2018-11-16 — End: 2018-12-14

## 2018-11-16 MED ORDER — ATORVASTATIN CALCIUM 10 MG PO TABS
20.0000 mg | ORAL_TABLET | Freq: Every day | ORAL | 2 refills | Status: DC
Start: 2018-11-16 — End: 2018-11-18

## 2018-11-16 NOTE — Progress Notes (Signed)
[facility] Cardiology and Vascular Medicine                                                                                                                  Patient Name: Christopher Cupples Sr.   Date of Birth: [birth year]  Age: 73 y.o.  Gender: male    Patient Care Team:  Gerilyn Pilgrim, MD as PCP - General (Family Medicine)  Glynis Smiles, FNP as Nurse Practitioner (Family Nurse Practitioner)    Chief Complaint: Coronary Artery Disease (1 mon f/u per K. Wrenn, PA LOV 10/13/2018); Cardiomyopathy; Congestive Heart Failure; and Atrial Fibrillation      History of Present Illness   Christopher Reczek Sr. is a 73 y.o. 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  This is a follow-up visit.    It is worth mentioning   The patient has a history of alcohol abuse.  He said he cut down but now he continues to drink at least 6-10 beers a day   I initially evaluated the patient in 2018 and then he lost follow-up until 2019.  The patient did miss several of his appointments.   Patient got cardioverted back in 2019 and again in January 2020   Patient was on a LifeVest for some time.  The patient is very hesitant to get an ICD.    Events and changes since last visit:    No hospitalizations since the last visit   The patient continues to drink 6-10 beers a day   Patient quit smoking   The patient says he takes his medications-however he sometimes forgets to take them.   No falls or clinical evidence of bleeding     The patient denies chest pain, worsening DIB (difficulty in breathing), palpitations, dizziness or syncope. No symptoms suggestive of TIA, stroke or claudication.     Cardiac medications: The patient says he takes his medication however occasionally he forgets that.  Salt restriction: The patient is compliant    The patient's functional capacity: Able to perform daily activities with no CV restriction   Review of Systems     CONSTITUTIONAL: There is no history of fever, weight loss or cough.  DERMATOLOGIC: The patient denies any lesions or rashes.  ENT: No history of congestion, postnasal drip, sore throat or hearing changes.  RESPIRATORY: See HPI   CARDIOVASCULAR: See HPI   GASTROINTESTINAL: No history of nausea, vomiting, diarrhea or abdominal pain.  GENITOURINARY: No history of dysuria or polyuria   MUSCULOSKELETAL: No significant muscle weakness   ENDOCRINE: She denies any heat or cold intolerance or excessive thirst or urination.  NEUROLOGICAL: Currently, no headache. She has no vision changes, dizziness or fainting. No numbness or tingling.  PSYCHIATRIC: No apparent significant depression     Past Medical History     11/16/18    1. Acute on chronic systolic congestive heart failure    2. Paroxysmal  atrial fibrillation    3. Essential hypertension    4. Cardiomyopathy, unspecified type    5. Coronary artery disease due to calcified coronary lesion     Past Surgical History     Past Surgical History:   Procedure Laterality Date    APPENDECTOMY      COLONOSCOPY, POLYPECTOMY  12/16/2012    Procedure: COLONOSCOPY, POLYPECTOMY;  Surgeon: Gwenith Spitz, MD;  Location: [facility];  Service: Gastroenterology;  Laterality: N/A;    EGD, BIOPSY  12/16/2012    Procedure: EGD, BIOPSY;  Surgeon: Gwenith Spitz, MD;  Location: [facility];  Service: Gastroenterology;  Laterality: N/A;    KNEE ARTHROSCOPY W/ ACL RECONSTRUCTION      LIFT, ARM (MEDICAL)      RIGHT & LEFT HEART CATH N/A 05/20/2018    Procedure: RIGHT & LEFT HEART CATH;  Surgeon: Rudean Curt, MD;  Location: [facility] CATH/EP;  Service: Cardiovascular;  Laterality: N/A;  total heart cath 05/20/18       Family History     History reviewed. No pertinent family history.    Social History     Social History     Tobacco Use    Smoking status: Former Smoker      Packs/day: 0.50     Types: Cigarettes    Smokeless tobacco: Never Used   Substance Use Topics    Alcohol use: Yes     Comment: occasional    Drug use: No       Allergies     Allergies   Allergen Reactions    Codeine        Medications     Current Outpatient Medications   Medication Sig    albuterol (PROVENTIL HFA;VENTOLIN HFA) 108 (90 Base) MCG/ACT inhaler Inhale 2 puffs into the lungs every 6 (six) hours as needed (wheezing)    amiodarone (PACERONE) 200 MG tablet Take 1 tablet (200 mg total) by mouth daily    Ascorbic Acid (VITAMIN C) 500 MG Chew Tab Chew 2 each by mouth every morning    furosemide (LASIX) 40 MG tablet Take 40 mg by mouth every morning       mirtazapine (REMERON) 7.5 MG tablet Take 7.5 mg by mouth every evening       vitamin E 400 UNIT capsule Take 400 Units by mouth every morning    aspirin 81 MG EC tablet Take 1 tablet (81 mg total) by mouth daily    atorvastatin (LIPITOR) 80 MG tablet Take 1 tablet (80 mg total) by mouth daily    lisinopril (ZESTRIL) 10 MG tablet Take 1 tablet (10 mg total) by mouth daily    metoprolol succinate XL (TOPROL-XL) 25 MG 24 hr tablet Take 2 tablets (50 mg total) by mouth daily         Physical Exam     Vitals:    11/16/18 1042   BP: 180/90   Pulse: 72     Body mass index is 33.97 kg/m.  Wt Readings from Last 3 Encounters:   11/16/18 113.6 kg (250 lb 8 oz)   10/13/18 108.4 kg (239 lb)   09/17/18 104.3 kg (230 lb)          Labs     Lab Results   Component Value Date/Time    WBC 11.1 (H) 09/19/2018 09:34 AM    RBC 3.18 (L) 09/19/2018 09:34 AM    HGB 10.4 (L) 09/19/2018 09:34 AM    HCT 30.9 (L) 09/19/2018 09:34  AM    PLT 240 09/19/2018 09:34 AM    TSH 120.06 (H) 11/18/2018 07:02 AM        Lab Results   Component Value Date/Time    NA 140 11/18/2018 07:02 AM    K 3.8 11/18/2018 07:02 AM    CL 101 11/18/2018 07:02 AM    CO2 29 11/18/2018 07:02 AM    GLU 102 (H) 11/18/2018 07:02 AM    BUN 25 (H) 11/18/2018 07:02 AM    PROT 7.9 09/17/2018 03:42 PM    ALKPHOS  85 09/17/2018 03:42 PM    AST 19 09/17/2018 03:42 PM    ALT 16 09/17/2018 03:42 PM       Lab Results   Component Value Date/Time    CHOL 328 (H) 11/18/2018 07:02 AM    TRIG 110 11/18/2018 07:02 AM    HDL 102 (H) 11/18/2018 07:02 AM    LDL 204 11/18/2018 07:02 AM       No results found for: HGBA1CPERCNT    Cardiac testing and imaging:    TEE December 2018-the global left ventricular systolic function is severely depressed with an EF of 10 to 15%.  The right ventricle systolic function is mildly decreased.  The left atrium is moderately dilated.  There is suspicion of a mobile thrombus present in the left atrial appendage.  No significant valvular abnormalities detected  Echocardiogram November 2019-the left ventricle is moderately dilated.  The systolic function is severely depressed with an EF of 10 to 15%.  The right ventricle is mildly dilated with mildly reduced systolic function.  The left atrium is moderately dilated.  Aortic valve sclerosis is with no stenosis.  No other significant valvular functional abnormalities  TEE in January 2020-the left ventricle is moderately dilated.  EF 15 to 20%.  No thrombus detected in the left atrial appendage  Echocardiogram May 2020: EF =25 to 30%      Lexiscan January 2020:  Intermediate risk Lexiscan-sestamibi study demonstrating a partially reversible proximal to mid anterior wall perfusion defect.      The defect demonstrates predominantly edge recovery and suggest scar with mild peri-infarct ischemia.  There is additionally a     fixed inferior wall perfusion defect suggesting scar versus severe diaphragmatic attenuation.  LV systolic function is severely     globally reduced with a calculated ejection function of 22%.  Summed difference score is 5.        Cardiac catheterization January 2020-nonobstructive CAD.50% and 60 in the proximal mid LAD.  50% in the diagonal branch.  30% in the RCA.  20% in the PDA  Diagnosis      1. Paroxysmal atrial fibrillation  Basic  Metabolic Panel    B-type Natriuretic Peptide    Vitamin B12    Vitamin B1 (Thiamine),Whole blood    Lipid panel    TSH   2. Cardiomyopathy, alcoholic  Basic Metabolic Panel    B-type Natriuretic Peptide    Vitamin B12    Vitamin B1 (Thiamine),Whole blood    Lipid panel    TSH   3. Acute combined systolic and diastolic congestive heart failure  Basic Metabolic Panel    B-type Natriuretic Peptide    Vitamin B12    Vitamin B1 (Thiamine),Whole blood    Lipid panel    TSH   4. Long term current use of anticoagulant therapy     5. Long term current use of amiodarone          We  discussed the diagnosis and the prognosis of atrial fibrillation We discussed the signs and symptoms of bleeding and a stroke and I asked the patient to seek medical attention if either of them are suspected.    Plan:     I extensively discussed with the patient the importance of being completely abstinent from alcohol.  I strongly believe that alcohol plays a strong role in his cardiomyopathy and if he gets completely abstinent, and there is a better chance of his function improving.    Also had a long discussion with the patient in regards to being compliant with his medication      1. Continue on aspirin  2. Continue on Lipitor-lipid panel today.  His goal LDL is to be less than 70  3. Continue on amiodarone  4. Switch metoprolol to Toprol 25 mg once a day  5. Increase lisinopril to 10 mg once a day  6. Continue with Lasix  7. BMP, BNP, TSH, lipid panel vitamin B1 and vitamin B2 levels today  8. Continue on amiodarone  9. Continue on Lasix; if his weight continues to be elevated by the next visit, we will increase his Lasix to twice a day  10. Continue on Xarelto  11. Patient is not interested in getting an ICD at this time.  Moreover, it does not seem that the patient has been compliant with his optimal management and he continues to drink.  So technically he needs to be on optimal medical therapy and completely abstinent from alcohol before  he could qualify for an ICD    Follow up at the office in 4 weeks with the physician assistant in 3 months with me    Addendum  Labs showed his LDL to be above 200-I am going to increase his Lipitor to 80 mg  His TSH was 120-going to stop amiodarone and repeat the TSH when he sees our physician assistant Wrenn in 4 weeks  BNP is only mildly elevated  B12 is normal      Thank you kindly for referring this patient.     Respectfully,     Electronically signed by:   Harlin Rain, M.D.   [facility]   Phone: [phone]  Fax: [phone]

## 2018-11-16 NOTE — Patient Instructions (Addendum)
·   To make your heart stronger, you need to be completely off alcohol.  Not even 1 beer.   You could stop taking metoprolol (the medication to take twice a day) and start taking Toprol in the morning   We will start you on lisinopril 10 mg to be taken at night   You need to continue taking your aspirin   We are going to start you on a cholesterol medication Lipitor once a day   Take Lasix twice a day for the next 4 days and then go back to once a day again-you may take an extra tablet if you gain more than 5 pounds in 1 day, felt more out of breath or noticed more swelling in your legs or your belly   Find the number for the orthopedic and call him to make sure that they take a look at your right leg it at some point we need to restart the blood thinner however I am going to not do that for the time being   Avoid chips, crackers and canned soup-all salt sucks up the fluid from your body and puts it in your lungs and your belly and make you feel out of breath and gain weight

## 2018-11-18 ENCOUNTER — Other Ambulatory Visit
Admission: RE | Admit: 2018-11-18 | Discharge: 2018-11-18 | Disposition: A | Discharge status: Home / Self Care | Payer: Medicare PPO | Source: Ambulatory Visit | Attending: Cardiovascular Disease | Admitting: Cardiovascular Disease

## 2018-11-18 DIAGNOSIS — I5041 Acute combined systolic (congestive) and diastolic (congestive) heart failure: Secondary | ICD-10-CM

## 2018-11-18 DIAGNOSIS — I426 Alcoholic cardiomyopathy: Secondary | ICD-10-CM

## 2018-11-18 DIAGNOSIS — I480 Paroxysmal atrial fibrillation: Secondary | ICD-10-CM

## 2018-11-18 LAB — LIPID PANEL
Cholesterol: 328 mg/dL — ABNORMAL HIGH (ref 75–199)
Coronary Heart Disease Risk: 3.22
HDL: 102 mg/dL — ABNORMAL HIGH (ref 40–55)
LDL Calculated: 204 mg/dL
Triglycerides: 110 mg/dL (ref 10–150)
VLDL: 22 (ref 0–40)

## 2018-11-18 LAB — BASIC METABOLIC PANEL
Anion Gap: 13.8 mMol/L (ref 7.0–18.0)
BUN / Creatinine Ratio: 15.6 Ratio (ref 10.0–30.0)
BUN: 25 mg/dL — ABNORMAL HIGH (ref 7–22)
CO2: 29 mMol/L (ref 20–30)
Calcium: 8.6 mg/dL (ref 8.5–10.5)
Chloride: 101 mMol/L (ref 98–110)
Creatinine: 1.6 mg/dL — ABNORMAL HIGH (ref 0.80–1.30)
EGFR: 42 mL/min/{1.73_m2} — ABNORMAL LOW (ref 60–150)
Glucose: 102 mg/dL — ABNORMAL HIGH (ref 71–99)
Osmolality Calculated: 284 mOsm/kg (ref 275–300)
Potassium: 3.8 mMol/L (ref 3.5–5.3)
Sodium: 140 mMol/L (ref 136–147)

## 2018-11-18 LAB — VITAMIN B12: Vitamin B-12: 369 pg/mL (ref 213–816)

## 2018-11-18 LAB — B-TYPE NATRIURETIC PEPTIDE: B-Natriuretic Peptide: 138.1 pg/mL — ABNORMAL HIGH (ref 0.0–100.0)

## 2018-11-18 LAB — TSH: TSH: 120.06 u[IU]/mL — ABNORMAL HIGH (ref 0.40–4.20)

## 2018-11-18 MED ORDER — ATORVASTATIN CALCIUM 80 MG PO TABS
80.00 mg | ORAL_TABLET | Freq: Every day | ORAL | 3 refills | Status: AC
Start: 2018-11-18 — End: ?

## 2018-11-18 MED ORDER — RIVAROXABAN 15 MG PO TABS
15.00 mg | ORAL_TABLET | Freq: Every day | ORAL | 2 refills | Status: AC
Start: 2018-11-18 — End: ?

## 2018-11-25 ENCOUNTER — Telehealth: Payer: Self-pay

## 2018-11-25 NOTE — Telephone Encounter (Signed)
•   Current PCP Ov are scanned into chart,  Current labs have been requested

## 2018-11-27 NOTE — Telephone Encounter (Signed)
2ND REQUEST

## 2018-12-01 ENCOUNTER — Encounter: Payer: Medicare PPO | Admitting: Physician Assistant

## 2018-12-01 NOTE — Progress Notes (Deleted)
[facility] Cardiology and Vascular Medicine                                                                                                                  Patient Name: Christopher Tallon Sr.   Date of Birth: 08/03/1945  Age: 73 y.o.  Gender: male    Patient Care Team:  Gerilyn Pilgrim, MD as PCP - General (Family Medicine)  Glynis Smiles, FNP as Nurse Practitioner (Family Nurse Practitioner)    Chief Complaint: No chief complaint on file.      History of Present Illness   Christopher Bilal Sr. is a 73 y.o. 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  This is a follow-up visit.    Events and changes since last visit:         The patient denies chest pain, worsening DIB (difficulty in breathing), palpitations, dizziness or syncope. No symptoms suggestive of TIA, stroke or claudication.     Cardiac medications: The patient says he takes his medication however occasionally he forgets that.  Salt restriction: The patient is compliant   The patient's functional capacity: Able to perform daily activities with no CV restriction     It is worth mentioning   The patient has a history of alcohol abuse.  He said he cut down but now he continues to drink at least 6-10 beers a day   I initially evaluated the patient in 2018 and then he lost follow-up until 2019.  The patient did miss several of his appointments.   Patient got cardioverted back in 2019 and again in January 2020   Patient was on a LifeVest for some time.  The patient is very hesitant to get an ICD.      Review of Systems     CONSTITUTIONAL: There is no history of fever, weight loss or cough.  DERMATOLOGIC: The patient denies any lesions or rashes.  ENT: No history of congestion, postnasal drip, sore throat or hearing changes.  RESPIRATORY: See HPI    CARDIOVASCULAR: See HPI   GASTROINTESTINAL: No history of nausea, vomiting, diarrhea or abdominal pain.  GENITOURINARY: No history of dysuria or polyuria   MUSCULOSKELETAL: No significant muscle weakness   ENDOCRINE: She denies any heat or cold intolerance or excessive thirst or urination.  NEUROLOGICAL: Currently, no headache. She has no vision changes, dizziness or fainting. No numbness or tingling.  PSYCHIATRIC: No apparent significant depression     Past Medical History     PMH 12/01/2018 KW/GM  LOV 11/16/18 [place]  Labs 11/18/18 Epic    1- Atrial Fibrillation.   a. Patient was on Coumadin but changed to Eliquis due to history of noncompliance.   b. Echo, 03/2017: Global left ventricular systolic function is severely decreased. Estimated EF 10-15%. Global hypokinesis with base contracting little better. The right ventricular systolic function is mildly decreased. The left atrium is moderately enlarged. There is  spontaneous echo contrast present in the left atrial cavity. Mobile thrombus present in the left atrial appendage. Spontaneous echo contrast seen in the left atrial appendage.   c. Echo, 03/03/2018: The left ventricle is moderate to severely dilated at 6.3 cm. Global hypokinesis with left ventricular ejection fraction severely decreased, estimated at 10-15%. The right ventricular size is mildy increased. The right ventricular systolic function is mildly decreased. The left atrium is moderately enlarged. The right atrium is mildly enlarged. There is mild aortic dilatation at the level of the sinuses of Valsalva. Normal respiratory variation of the inferior vena cava is present. No evidence of an atrial septal defect by color doppler. Early closure of the mitral valve suggesting elevated left ventricular end-diastolic pressure.   d. TEE, 05/27/2018: The left ventricular cavity size is moderately increased. The left ventricular ejection fraction is severely decreased, estimated at 15- 20%. The left  atrial appendage is severely increased in size. There is spontaneous echo contrast present in the left atrial appendage, no thrombus formation seen.          2- NICM, Systolic HF.   a. EF 10-15% on Echo November 2019.   b. THC, 04/2018: Probable nonischemic cardiomyopathy with degree of LV function out of proportion to coronary artery disease.  Etiology could be hypertensive, alcoholic, or tachycardia induced. Borderline LAD disease. Mild pulmonary hypertension  c. EF 15-20% by TEE February 2020.   d. Echo 09/23/18: Limited study for evaluation of the ventricular function. Definity contrast utilized. The left ventricular cavity size is normal. The left ventricular ejection fraction is severely decreased, estimated at 25-30%. In limited views, the right ventricular size and systolic function are normal.     3- COPD.     4- Hypertension.   5- Left Atrial thrombus, secondary to afib. 03/2017.  6- Remeron was started but discontinued as patient also on amiodarone and because prolongation of QT interval  7- NSVT, Life vest after hospitalization 04/2018.     8- Non-obstructive CAD.   a. Nuclear Stress, 05/21/18: partially reversible proximal to mid anterior wall perfusion defect. The defect demonstrates predominantly edge recovery and suggest scar with mild peri-infarct ischemia.  There is additionally a fixed inferior wall perfusion defect suggesting scar versus severe diaphragmatic attenuation.  LV systolic function is severely globally reduced with a calculated ejection function of 22%.  Summed difference score is 5.    Past Surgical History     Past Surgical History:   Procedure Laterality Date   . APPENDECTOMY     . COLONOSCOPY, POLYPECTOMY  12/16/2012    Procedure: COLONOSCOPY, POLYPECTOMY;  Surgeon: Gwenith Spitz, MD;  Location: [facility];  Service: Gastroenterology;  Laterality: N/A;   . EGD, BIOPSY  12/16/2012    Procedure: EGD, BIOPSY;  Surgeon: Gwenith Spitz, MD;  Location: [facility];   Service: Gastroenterology;  Laterality: N/A;   . KNEE ARTHROSCOPY W/ ACL RECONSTRUCTION     . LIFT, ARM (MEDICAL)     . RIGHT & LEFT HEART CATH N/A 05/20/2018    Procedure: RIGHT & LEFT HEART CATH;  Surgeon: Rudean Curt, MD;  Location: [facility] CATH/EP;  Service: Cardiovascular;  Laterality: N/A;  total heart cath 05/20/18       Family History     No family history on file.    Social History     Social History     Tobacco Use   . Smoking status: Former Smoker     Packs/day: 0.50  Types: Cigarettes   . Smokeless tobacco: Never Used   Substance Use Topics   . Alcohol use: Yes     Comment: occasional   . Drug use: No       Allergies     Allergies   Allergen Reactions   . Codeine        Medications     Current Outpatient Medications   Medication Sig   . albuterol (PROVENTIL HFA;VENTOLIN HFA) 108 (90 Base) MCG/ACT inhaler Inhale 2 puffs into the lungs every 6 (six) hours as needed (wheezing)   . amiodarone (PACERONE) 200 MG tablet Take 1 tablet (200 mg total) by mouth daily   . Ascorbic Acid (VITAMIN C) 500 MG Chew Tab Chew 2 each by mouth every morning   . aspirin 81 MG EC tablet Take 1 tablet (81 mg total) by mouth daily   . atorvastatin (LIPITOR) 80 MG tablet Take 1 tablet (80 mg total) by mouth daily   . furosemide (LASIX) 40 MG tablet Take 40 mg by mouth every morning      . lisinopril (ZESTRIL) 10 MG tablet Take 1 tablet (10 mg total) by mouth daily   . metoprolol succinate XL (TOPROL-XL) 25 MG 24 hr tablet Take 2 tablets (50 mg total) by mouth daily   . mirtazapine (REMERON) 7.5 MG tablet Take 7.5 mg by mouth every evening      . rivaroxaban (XARELTO) 15 MG Tab Take 1 tablet (15 mg total) by mouth daily with dinner   . vitamin E 400 UNIT capsule Take 400 Units by mouth every morning         Physical Exam     There were no vitals filed for this visit.  There is no height or weight on file to calculate BMI.  Wt Readings from Last 3 Encounters:   11/16/18 113.6 kg (250 lb 8 oz)   10/13/18 108.4 kg (239 lb)    09/17/18 104.3 kg (230 lb)          Labs     Lab Results   Component Value Date/Time    WBC 11.1 (H) 09/19/2018 09:34 AM    RBC 3.18 (L) 09/19/2018 09:34 AM    HGB 10.4 (L) 09/19/2018 09:34 AM    HCT 30.9 (L) 09/19/2018 09:34 AM    PLT 240 09/19/2018 09:34 AM    TSH 120.06 (H) 11/18/2018 07:02 AM        Lab Results   Component Value Date/Time    NA 140 11/18/2018 07:02 AM    K 3.8 11/18/2018 07:02 AM    CL 101 11/18/2018 07:02 AM    CO2 29 11/18/2018 07:02 AM    GLU 102 (H) 11/18/2018 07:02 AM    BUN 25 (H) 11/18/2018 07:02 AM    PROT 7.9 09/17/2018 03:42 PM    ALKPHOS 85 09/17/2018 03:42 PM    AST 19 09/17/2018 03:42 PM    ALT 16 09/17/2018 03:42 PM       Lab Results   Component Value Date/Time    CHOL 328 (H) 11/18/2018 07:02 AM    TRIG 110 11/18/2018 07:02 AM    HDL 102 (H) 11/18/2018 07:02 AM    LDL 204 11/18/2018 07:02 AM       No results found for: HGBA1CPERCNT    Cardiac testing and imaging:    TEE December 2018-the global left ventricular systolic function is severely depressed with an EF of 10 to 15%.  The right ventricle systolic function  is mildly decreased.  The left atrium is moderately dilated.  There is suspicion of a mobile thrombus present in the left atrial appendage.  No significant valvular abnormalities detected  Echocardiogram November 2019-the left ventricle is moderately dilated.  The systolic function is severely depressed with an EF of 10 to 15%.  The right ventricle is mildly dilated with mildly reduced systolic function.  The left atrium is moderately dilated.  Aortic valve sclerosis is with no stenosis.  No other significant valvular functional abnormalities  TEE in January 2020-the left ventricle is moderately dilated.  EF 15 to 20%.  No thrombus detected in the left atrial appendage  Echocardiogram May 2020: EF =25 to 30%      Lexiscan January 2020:  Intermediate risk Lexiscan-sestamibi study demonstrating a partially reversible proximal to mid anterior wall perfusion defect.       The defect demonstrates predominantly edge recovery and suggest scar with mild peri-infarct ischemia.  There is additionally a     fixed inferior wall perfusion defect suggesting scar versus severe diaphragmatic attenuation.  LV systolic function is severely     globally reduced with a calculated ejection function of 22%.  Summed difference score is 5.        Cardiac catheterization January 2020-nonobstructive CAD.50% and 60 in the proximal mid LAD.  50% in the diagonal branch.  30% in the RCA.  20% in the PDA  Diagnosis      1. Essential hypertension     2. Dyslipidemia     3. Paroxysmal atrial fibrillation     4. Coronary artery disease due to calcified coronary lesion     5. Cardiomyopathy, alcoholic     6. Systolic dysfunction     7. Long term current use of anticoagulant therapy          Plan: We discussed the diagnosis and the prognosis of atrial fibrillation We discussed the signs and symptoms of bleeding and a stroke and I asked the patient to seek medical attention if either of them are suspected.    I extensively discussed with the patient the importance of being completely abstinent from alcohol.  I strongly believe that alcohol plays a strong role in his cardiomyopathy and if he gets completely abstinent, and there is a better chance of his function improving.    Also had a long discussion with the patient in regards to being compliant with his medication      1. Continue on aspirin  2. Continue on Lipitor,LDL goal <80  3. Continue Toprol 25 mg once a day  4. Continue lisinopril 10 mg once a day  5. Continue with Lasix  6. Continue on Lasix; if his weight continues to be elevated by the next visit, we will increase his Lasix to twice a day  7. Continue on Xarelto  8. Patient is not interested in getting an ICD at this time.  Moreover, it does not seem that the patient has been compliant with his optimal management and he continues to drink.  So technically he needs to be on optimal medical therapy and  completely abstinent from alcohol before he could qualify for an ICD  9. The pt knows to call the office or seek immediate medical attention if sx arise or worsen    Follow up at the office in 2 months with Dr. Mindi JunkerJonah Blue.      Respectfully,     Electronically signed by:   Barron Alvine, PA-C  [facility]  Med[facility]   Phone: [phone]  Fax: [phone]

## 2018-12-14 ENCOUNTER — Other Ambulatory Visit: Payer: Self-pay | Admitting: Cardiovascular Disease

## 2019-01-11 ENCOUNTER — Other Ambulatory Visit: Payer: Self-pay | Admitting: Cardiovascular Disease

## 2019-01-15 ENCOUNTER — Emergency Department
Admission: EM | Admit: 2019-01-15 | Discharge: 2019-01-15 | Disposition: A | Discharge status: Home / Self Care | Payer: Medicare PPO | Attending: Emergency Medicine | Admitting: Emergency Medicine

## 2019-01-15 ENCOUNTER — Emergency Department: Payer: Medicare PPO

## 2019-01-15 DIAGNOSIS — R42 Dizziness and giddiness: Secondary | ICD-10-CM | POA: Insufficient documentation

## 2019-01-15 DIAGNOSIS — W19XXXA Unspecified fall, initial encounter: Secondary | ICD-10-CM | POA: Insufficient documentation

## 2019-01-15 DIAGNOSIS — S0990XA Unspecified injury of head, initial encounter: Secondary | ICD-10-CM | POA: Insufficient documentation

## 2019-01-15 NOTE — ED Provider Notes (Signed)
[facility] EMERGENCY DEPARTMENT History and Physical Exam      Patient Name: Christopher Dickerson, Christopher SR.  Encounter Date:  01/15/2019  Attending Physician: Joesphine Bare, MD  PCP: Gerilyn Pilgrim, MD  Patient DOB:  1945-05-16  MRN:  [id number]  Room:  S22/S22-A      History of Presenting Illness     Chief complaint: Dizziness    HPI/ROS is limited by: none  HPI/ROS given by: patient and EMS    Location: Generalized  Duration: 7to 10 days  Severity: mild    Christopher Gully Sr. is a 73 y.o. male who presents with lightheadedness.  Patient notes some sensation of dizziness when he turns his head certain sides and with certain positions.  He notes he fell approximately 5 to 7 days ago while taking his garbage to the landfill.  The patient tripped over a small "curb" patient notes that he has had no shoulder wrist elbow hips knees or ankle pain no pain along the midline the cervical thoracic or lumbar spine no burning in the hands.  There is been no history of any fevers.  He has ear fullness.  He denies any blackouts.  No seizures.  He denies any history of any lack of coordination.  He does note he had an abrasion that was appreciated at urgent care.  His tetanus is up-to-date.  He denies any abdominal pain.  No cough no hemoptysis no hematemesis.  No diarrhea.  He notes a sensation of "like water in my ears"    Review of Systems   Review of Systems   Constitutional: Negative.  Negative for chills, fever, malaise/fatigue and weight loss.   HENT: Negative.    Eyes: Negative for blurred vision and double vision.   Respiratory: Negative.  Negative for cough and shortness of breath.    Cardiovascular: Negative.  Negative for chest pain, palpitations, orthopnea, claudication and leg swelling.   Gastrointestinal: Negative.  Negative for abdominal pain, nausea and vomiting.   Genitourinary: Negative.    Musculoskeletal: Negative.  Negative for joint pain and myalgias.   Skin: Negative.  Negative for rash.   Neurological:  Positive for dizziness (He notes lightheadedness and at times a sense of the room spinning with his "equilibrium being off"). Negative for tingling, tremors, sensory change, speech change, focal weakness, seizures, loss of consciousness, weakness and headaches.   Endo/Heme/Allergies: Negative.    Psychiatric/Behavioral: Negative.    All other systems reviewed and are negative.          Allergies     Pt is allergic to codeine.    Medications     No current facility-administered medications for this encounter.     Current Outpatient Medications:     albuterol (PROVENTIL HFA;VENTOLIN HFA) 108 (90 Base) MCG/ACT inhaler, Inhale 2 puffs into the lungs every 6 (six) hours as needed (wheezing), Disp: , Rfl:     amiodarone (PACERONE) 200 MG tablet, Take 1 tablet (200 mg total) by mouth daily, Disp: 90 tablet, Rfl: 3    Ascorbic Acid (VITAMIN C) 500 MG Chew Tab, Chew 2 each by mouth every morning, Disp: , Rfl:     aspirin 81 MG EC tablet, Take 1 tablet (81 mg total) by mouth daily, Disp: , Rfl:     atorvastatin (LIPITOR) 80 MG tablet, Take 1 tablet (80 mg total) by mouth daily, Disp: 90 tablet, Rfl: 3    furosemide (LASIX) 40 MG tablet, Take 40 mg by mouth every morning  , Disp: ,  Rfl:     lisinopril (ZESTRIL) 10 MG tablet, TAKE 1 TABLET BY MOUTH EVERY DAY, Disp: 90 tablet, Rfl: 3    metoprolol succinate XL (TOPROL-XL) 25 MG 24 hr tablet, TAKE 2 TABLETS BY MOUTH EVERY DAY, Disp: 90 tablet, Rfl: 3    mirtazapine (REMERON) 7.5 MG tablet, Take 7.5 mg by mouth every evening  , Disp: , Rfl:     rivaroxaban (XARELTO) 15 MG Tab, Take 1 tablet (15 mg total) by mouth daily with dinner, Disp: 90 tablet, Rfl: 2    vitamin E 400 UNIT capsule, Take 400 Units by mouth every morning, Disp: , Rfl:    Medications were reviewed by md  Past Medical History     Pt  Past Medical History:   Diagnosis Date    Arthritis     Atrial fibrillation     Cardiomyopathy     Chronic obstructive pulmonary disease     Hypertension     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    Secondary to Atrial Fib     Past medical history was reviewed by md  Past Surgical History     Pt  Past Surgical History:   Procedure Laterality Date    APPENDECTOMY      COLONOSCOPY, POLYPECTOMY  12/16/2012    Procedure: COLONOSCOPY, POLYPECTOMY;  Surgeon: Gwenith Spitz, MD;  Location: [facility];  Service: Gastroenterology;  Laterality: N/A;    EGD, BIOPSY  12/16/2012    Procedure: EGD, BIOPSY;  Surgeon: Gwenith Spitz, MD;  Location: [facility];  Service: Gastroenterology;  Laterality: N/A;    KNEE ARTHROSCOPY W/ ACL RECONSTRUCTION      LIFT, ARM (MEDICAL)      RIGHT & LEFT HEART CATH N/A 05/20/2018    Procedure: RIGHT & LEFT HEART CATH;  Surgeon: Rudean Curt, MD;  Location[facility] CATH/EP;  Service: Cardiovascular;  Laterality: N/A;  total heart cath 05/20/18     Past surgical history was reviewed by md  Family History   History reviewed. No pertinent family history.  The family history was reviewed by md  Social History     Pt  Social History     Socioeconomic History    Marital status: Widowed     Spouse name: None    Number of children: None    Years of education: None    Highest education level: None   Occupational History    None   Social Engineer, site strain: None    Food insecurity     Worry: None     Inability: None    Transportation needs     Medical: None     Non-medical: None   Tobacco Use    Smoking status: Former Smoker     Packs/day: 0.50     Types: Cigarettes    Smokeless tobacco: Never Used   Substance and Sexual Activity    Alcohol use: Yes     Comment: occasional    Drug use: No    Sexual activity: None   Lifestyle    Physical activity     Days per week: None     Minutes per session: None    Stress: None   Relationships    Social connections     Talks on phone: None     Gets together: None     Attends religious service: None     Active member of club or organization: None  Attends meetings of clubs or  organizations: None     Relationship status: None    Intimate partner violence     Fear of current or ex partner: None     Emotionally abused: None     Physically abused: None     Forced sexual activity: None   Other Topics Concern    None   Social History Narrative    None     Social history reviewed by md  Physical Exam     Blood pressure (!) 167/99, pulse 66, temperature 98.1 F (36.7 C), temperature source Oral, resp. rate 13, height 1.829 m, weight 118.4 kg, SpO2 98 %.    Physical Exam   Constitutional: He is oriented to person, place, and time and well-developed, well-nourished, and in no distress. No distress.   Patient is sitting upright. They make good eye contact. They are interactive. They are conversing without difficulty and in no acute apparent distress.  Symptoms are made worse by turning his head to the right and left and with sudden movement   HENT:   Head: Normocephalic.   Right Ear: External ear normal.   Left Ear: External ear normal.   Nose: Nose normal.   Mouth/Throat: Oropharynx is clear and moist.   Cerumen in the bilateral canals.  Air-fluid level on the right greater than the left.  No tenderness along the bilateral.  Postauricular area or the mastoid process.  No tenderness along the neck.  Supple neck no JVD no bruits abrasion along the right occiput no laceration appreciated   Eyes: Pupils are equal, round, and reactive to light. Conjunctivae and EOM are normal.   Neck: Normal range of motion. Neck supple. No JVD present. No tracheal deviation present. No thyromegaly present.   Cardiovascular: Normal rate, regular rhythm, normal heart sounds and intact distal pulses. Exam reveals no gallop and no friction rub.   No murmur heard.  Regular rhythm without murmur rubs or gallops. No bruits. Good peripheral pulses. Good cap refill. Symmetrical pulses. No abdominal bruits     Pulmonary/Chest: Effort normal and breath sounds normal. No stridor. No respiratory distress. He has no wheezes. He  has no rales. He exhibits no tenderness.   Abdominal: Soft. Bowel sounds are normal. He exhibits no distension and no mass. There is no abdominal tenderness. There is no rebound and no guarding.   Musculoskeletal: Normal range of motion.         General: No tenderness or edema.   Lymphadenopathy:     He has no cervical adenopathy.   Neurological: He is alert and oriented to person, place, and time. He has normal reflexes. No cranial nerve deficit. He exhibits normal muscle tone. Gait normal. Coordination normal. GCS score is 15.   Pain and sensation is intact. Good grip strength bilaterally. Negative straight leg raise and straight arm raise bilaterally. No cerebellar signs. Fine sensation is intact. Cranial nerves II through XII are grossly intact. Patient able to bear weight.  Symptoms worse with sudden movements.  Good grip strength bilaterally is noted.  No decreased sensation   Skin: Skin is warm and dry. No rash noted. He is not diaphoretic. No erythema. No pallor.   Psychiatric: Mood, memory, affect and judgment normal.   Nursing note and vitals reviewed.          Orders Placed     Orders Placed This Encounter   Procedures    CT Head WO- (Rad read)    ECG 12 lead (  Specify reason for exam)       Diagnostic Results       The results of the diagnostic studies below have been reviewed by myself:    Labs  Results     ** No results found for the last 24 hours. **          Radiologic Studies  Radiology Results (24 Hour)     Procedure Component Value Units Date/Time    CT Head WO- (Rad read) [[id number]] Collected: 01/15/19 1556    Order Status: Completed Updated: 01/15/19 1602    Narrative:      Clinical History:  fall and dizzy  ACR Reason for Exam    Ordering Comments:   None.      Study Notes:   None.     Examination:  CT HEAD WO CONTRAST    TECHNIQUE:  5 mm helical images obtained from the skull base through the vertex without contrast.  2.5 mm reconstructed bone algorithm, and 3 mm sagittal and coronal  reformatted images provided.  CT images were acquired using Automated Exposure Control for dose   reduction.     COMPARISON:   None available    FINDINGS:   BRAIN PARENCHYMA: No acute hemorrhage. No mass effect or herniation. Gray-white differentiation is maintained. White matter is within normal limits for age.    VENTRICLES/EXTRA-AXIAL SPACES: Ventricles and cortical sulci are prominent consistent with mild cerebral atrophy. There are no extra-axial fluid collections. 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    EXTRACRANIAL STRUCTURES: Paranasal sinuses demonstrate mild mucosal thickening base of the right maxillary sinus. Other sinuses are clear.    Decreased pneumatization to the mastoids. Mild opacification of some the left mastoid air cells.    No fracture.      Impression:      No acute intracranial findings.    ReadingStation:[id number]          EKG: See Ep[facility]any for formal interpretation.      MDM / Critical Care     Blood pressure (!) 167/99, pulse 66, temperature 98.1 F (36.7 C), temperature source Oral, resp. rate 13, height 1.829 m, weight 118.4 kg, SpO2 98 %.    This patient presents to the Emergency Department with dizziness/vertigo.  Based on my history, examination, and evaluation, several differential diagnoses including peripheral vertigo, central vertigo, dehydration, neurologic/cardiac causes for dizziness have been considered.  Serious or potentially life-threatening causes of the patient's symptoms like cardiac/neurologic causes seem unlikely.  Patient has had a recent fall with head injury.  My space versus intracranial hemorrhage is relatively low.  Patient is no strong evidence to support central cord syndrome or other occult fracture.  He has a benign abdomen.  There is no strong evidence to support retroperitoneal injury or hematoma        The patient seems improved, is able to ambulation and keep fluids down, is neurologically intact, and can be discharged home and  managed with symptomatic care.  There is no strong evidence to support intracranial process including but not excluding subarachnoid hemorrhage, subdural hemorrhag, or epidural hemorrhage as well as space occupying lesion.  There is no strong evidence to support cva, tia, or infectious causes  I advised the patient to return to the emergency department immediately should they develop syncope, neurologic symptoms, chest pain, uncontrolled vertigo, or any acute concerns .  Diagnostic impression and plan were discussed with the patient and/or family.  If ordered, results of lab/radiology tests were reviewed  and discussed with the patient and/or family. Questions were answered and concerns were addressed.  The patient was encouraged to follow-up with their primary care provider or specialist.    Patient was resting comfortably in the emergency department.  He was neurovascular intact.  Is able to ambulate.  We recommended that he utilize a walker.  The patient was placed on meclizine.  It was called into his pharmacy.  He thanked [place] for his care.  He noted he wanted to go home.  Results were reviewed with the patient.  Patient was neurovascular intact and had good strength.  Procedures             Diagnosis / Disposition     Clinical Impression  1. Injury of head, initial encounter    2. Dizziness    3. Fall, initial encounter        Disposition  ED Disposition     ED Disposition Condition Date/Time Comment    Discharge  Fri Jan 15, 2019  4:22 PM Christopher Nolasco Sr. discharge to home/self care.    Condition at disposition: Stable          Prescriptions  Discharge Medication List as of 01/15/2019  4:31 PM                     Joesphine Bare, MD  01/16/19 1244

## 2019-01-15 NOTE — ED Notes (Signed)
Dr. Turnbull to bedside

## 2019-01-15 NOTE — ED Notes (Signed)
Bed: S22-A  Expected date:   Expected time:   Means of arrival:   Comments:  EMS- 22M Weakness

## 2019-01-15 NOTE — ED Notes (Signed)
At Dr. Raquel James request, called prescription into [facility] on [address]  Prescription for Meclizine 25 mg, 1 tablet every 8 hours as needed for dizziness, qty 20, zero refills.

## 2019-01-15 NOTE — Discharge Instructions (Signed)
Dizziness (Vertigo) and Balance Problems: Staying Safe     Replace burned-out light bulbs to keep your home safe and well lit.   Falls or accidents can lead to pain, broken bones, a hospital stay, and a fear of future falls. Protect yourself and others by preparing for episodes. Simple steps can help you stay safe at home and wherever you go.  Lighting  Keep all areas well lit. This helps your eyes send the right signals to the brain. It also makes you less likely to trip and fall. If bright lights make symptoms worse, dim the lights or lie in a dark room until the dizziness passes. Then turn the lights back to their normal level.  Tips:   Keep a flashlight by the bed.   Place nightlights in bathrooms and hallways.   Replace burned-out bulbs. Or have someone replace them for you.  Preventing falls  To reduce your risk of falling:   Get out of bed or up from achair slowly.   Wear low-heeled shoes that fit properly and have slip-resistant soles.   Remove throw rugs. Clear clutter from walkways.   Use handrails on stairs. Have handrails installed or adjusted if needed.   Install grab bars in the bathroom. Don't use towel racks for balance.   Use a shower stool. Also put adhesive strips in the shower or on the tub floor.  Going out  With a little time and preparation, you can get around safely.  Tips:   Bring a cane or walking aid if needed.   Give yourself plenty of time in case you start to get dizzy.   Ask your healthcare provider what type of exercise is safe for your condition.   Be patient. If an activity such as walking through a crowded shop causes you stress, you may not be ready for it yet.  Driving  If you become dizzy or disoriented while driving, you could hurt yourself and others. That's why it's best to not drive until symptoms have gone away. In some cases, your license may be temporarily held until it's safe for you to drive again.  For safety:   Ask a friend to drive for you.   Take  public transportation.   Walk to stores and other places when you can.    Don't be afraid to ask for help running errands, cooking meals, and doing exercise. Whether it's a friend, loved one, neighbor, or stranger on the street, a little help can make a world of difference. Ask your healthcare provider for a list of community resources if you need help maintaining your independence at home.  [employer] last reviewed this educational content on 04/29/2018   2000-2020 [employer], [place]. [address] [place], [place] [address]. All rights reserved. This information is not intended as a substitute for professional medical care. Always follow your healthcare professional's instructions.          Preventing Falls: Are You At Risk of Falling?     Ask for help to reduce risk of falling in your home.     As you get older, you're not as steady on your feet as you once were. And you may have health problems you didn't have when you were younger. So, it's not surprising that older people are more likely to trip and fall. Falling can be very serious. It can change your overall health and quality of life. That's why it's important to be aware of your own risk of  falling.  The dangers of falling  Falls are one of the main causes of injury in people over age 20. An older person who falls may take longer to get better than a younger person. And, after a fall, an older person is more likely to have problems that don't go away. So, preventing falls can help you avoid serious health problems.  Are you at risk of falling?  Answer these questions to rate your level of risk.   Are you a woman?   Have you fallen or stumbled in the last year?   Are you over age 37?   Are you ever dizzy or lightheaded with standing?   Do you have a hard time getting in and out of the bathtub or on and off the toilet?   Do you lean on objects to help you get around? Or do you use a cane or walker?   Do you have vision or hearing problems? For  example, do you need new glasses or hearing aids?   Do you have 2 or more long-lasting (chronic) medical conditions?   Do you take 3 or more medicines?   Have you felt depressed recently?   Have you had more trouble with your memory in recent months?   Are there hazards in your home that might cause you to fall, such as loose rugs or poor lighting?   Do you have a pet that jumps on you or might trip you?   Have you stopped getting regular exercise?   Do you have diabetes?   Do you have a neurologic disease, such as Parkinson or Alzheimer disease?   Do you drink alcohol?   Do you wear athletic shoes or slippers, or go barefoot at home?  You can help prevent falls  If you answered "yes" to any of the above questions, take steps to reduce your risk of a fall. Monitoring health conditions and keeping walkways in your home free of clutter are just two ways. Changing is sometimes easier said than done. But keep in mind that even small changes can make you less likely to fall.  The fear of falling  It's normal to be scared of falling, especially if you've fallen before. But being afraid can actually make you more likely to fall. This is because:   Fear might cause you to become less active. Being less active can lead to a loss of strength and balance.   Fear can lead to isolation from others, depression, or the use of more medicines or alcohol. And all these things make falling even more likely.  To break the cycle, learn more about ways to avoid falling. As you take control, you may find yourself feeling less afraid.  StayWell last reviewed this educational content on 08/28/2015   2000-2020 [employer], [place]. [address] [place], [place] [address]. All rights reserved. This information is not intended as a substitute for professional medical care. Always follow your healthcare professional's instructions.

## 2019-02-03 LAB — ECG 12-LEAD
P Wave Axis: 46 deg
P-R Interval: 251 ms
Patient Age: 72 years
Q-T Interval(Corrected): 530 ms
Q-T Interval: 494 ms
QRS Axis: 69 deg
QRS Duration: 139 ms
T Axis: 56 years
Ventricular Rate: 69 //min

## 2019-02-18 ENCOUNTER — Telehealth: Payer: Self-pay

## 2019-02-18 NOTE — Telephone Encounter (Signed)
•   Current PCP Ov are scanned into chart,  Current labs have been requested

## 2019-02-24 ENCOUNTER — Encounter: Payer: Medicare PPO | Admitting: Cardiovascular Disease

## 2019-05-13 ENCOUNTER — Ambulatory Visit (INDEPENDENT_AMBULATORY_CARE_PROVIDER_SITE_OTHER): Payer: Medicare PPO

## 2019-05-13 ENCOUNTER — Other Ambulatory Visit
Admission: RE | Admit: 2019-05-13 | Discharge: 2019-05-13 | Disposition: A | Discharge status: Home / Self Care | Payer: Medicare PPO | Source: Ambulatory Visit | Attending: Adult Health | Admitting: Adult Health

## 2019-05-13 DIAGNOSIS — Z03818 Encounter for observation for suspected exposure to other biological agents ruled out: Secondary | ICD-10-CM

## 2019-05-13 LAB — VH APTIMA SARS-COV-2 ASSAY (PANTHER SYSTEM)(TM)
Aptima SARS-CoV-2: NEGATIVE
Date of Onset: 20210107
Does patient reside in a congregate care setting?: NEGATIVE
Is patient admitted to the intensive care unit?: NEGATIVE
Is patient employed in a healthcare setting?: NEGATIVE
Is the patient hospitalized because of this condition?: NEGATIVE

## 2019-05-14 ENCOUNTER — Telehealth: Payer: Self-pay

## 2019-05-14 NOTE — Telephone Encounter (Signed)
++++  PCP was contacted/Requested most recent office note, labs and cardiac testing with tracing (within the past 6months)

## 2019-05-18 NOTE — Telephone Encounter (Signed)
NOTE AND LABS RECEIVED AND SCANNED TO 01.19.21 SCAN ONLY

## 2019-05-20 ENCOUNTER — Encounter: Payer: Medicare PPO | Admitting: Physician Assistant

## 2019-07-17 ENCOUNTER — Other Ambulatory Visit: Payer: Self-pay | Admitting: Cardiovascular Disease

## 2019-10-28 ENCOUNTER — Other Ambulatory Visit: Payer: Self-pay | Admitting: Cardiovascular Disease

## 2019-12-06 ENCOUNTER — Encounter (INDEPENDENT_AMBULATORY_CARE_PROVIDER_SITE_OTHER): Payer: Self-pay

## 2019-12-06 ENCOUNTER — Ambulatory Visit (INDEPENDENT_AMBULATORY_CARE_PROVIDER_SITE_OTHER): Payer: Medicare PPO | Admitting: Family

## 2019-12-06 VITALS — BP 103/81 | HR 87 | Temp 98.1°F | Resp 18 | Ht 72.0 in | Wt 230.0 lb

## 2019-12-06 DIAGNOSIS — M109 Gout, unspecified: Secondary | ICD-10-CM

## 2019-12-06 MED ORDER — PREDNISONE 20 MG PO TABS
ORAL_TABLET | ORAL | 0 refills | Status: AC
Start: 2019-12-06 — End: 2019-12-12

## 2019-12-06 NOTE — Progress Notes (Signed)
Subjective:    Patient ID: Christopher Bille Sr. is a 74 y.o. male.  Patient had cloth masks and staff had surgical masks during visit    HPI  Patient presents to clinic with a chief complaint of bilateral feet and right hand pain since yesterday.  He associates the acute pain to gout flareup.  Patient reports that he was treated 1 week ago with a 3-day taper of prednisone for gout.  Patient states that symptoms resolved but have come back.  Patient denies any change in medication or diet.  States he does drink alcohol free beer.  Denies any fever, fatigue, nausea/vomiting/diarrhea, or dizziness.  The following portions of the patient's history were reviewed and updated as appropriate: allergies, current medications, past family history, past medical history, past social history, past surgical history and problem list.    Review of Systems   Constitutional: Negative for fever.   HENT: Negative for congestion and sore throat.    Eyes: Negative for redness.   Respiratory: Negative for cough and wheezing.    Cardiovascular: Negative for chest pain.   Gastrointestinal: Negative for abdominal pain.   Musculoskeletal: Positive for arthralgias (Bilateral feet and right hand) and gait problem (Guarding bilateral feet.).   Skin: Negative for rash.   Neurological: Negative for dizziness and headaches.   Hematological: Does not bruise/bleed easily.   Psychiatric/Behavioral: Negative for behavioral problems.         Objective:    BP 103/81   Pulse 87   Temp 98.1 F (36.7 C) (Oral)   Resp 18   Ht 1.829 m (6')   Wt 104.3 kg (230 lb)   BMI 31.19 kg/m     Physical Exam  Vitals reviewed.   Constitutional:       General: He is not in acute distress.     Appearance: He is well-developed. He is not ill-appearing or toxic-appearing.   HENT:      Head: Normocephalic.   Eyes:      General:         Right eye: No discharge.         Left eye: No discharge.      Conjunctiva/sclera: Conjunctivae normal.   Cardiovascular:       Pulses:           Dorsalis pedis pulses are 1+ on the left side.        Posterior tibial pulses are 1+ on the left side.   Pulmonary:      Effort: Pulmonary effort is normal.   Musculoskeletal:      Right hand: Tenderness present. Decreased range of motion.        Hands:       Left foot: Decreased range of motion.        Feet:       Comments: Point of touch tenderness and localized erythema noted as pictured above.  Patient has full range of motion of all digits.   Feet:      Left foot:      Skin integrity: Erythema present. No warmth.      Comments: Localized pain with associated mild erythema noted as pictured above.  Patient does not tolerate movement or touch of first MTP joint .  Neurological:      General: No focal deficit present.      Mental Status: He is alert and oriented to person, place, and time.   Psychiatric:         Mood and Affect:  Mood normal.         Behavior: Behavior normal.           Assessment and Plan:       Christopher Dickerson was seen today for foot pain and hand pain.    Diagnoses and all orders for this visit:    Acute gout of multiple sites, unspecified cause  -     predniSONE (DELTASONE) 20 MG tablet; Take 2 tablets (40 mg total) by mouth daily for 2 days, THEN 1.5 tablets (30 mg total) daily for 2 days, THEN 1 tablet (20 mg total) daily for 1 day, THEN 0.5 tablets (10 mg total) daily for 1 day.    Reviewed BMP from 11/14/2019    -The best testing for gout is joint aspiration. This can be done by ortho or podiatry.   -Blood testing for gout is problematic because it can be low, and then the joint aspiration could be positive.  Or the blood could be high, and the joint aspiration could be negative. Blood testing declined.    -Foods to avoid: alcohol, seafood, non-diet sodas, overeating  -Encourage weight loss.    -Treatment of gout: symptoms usually start to improve within 2-3 days.  -- steroid taper. Denies hx of DM, GI issues.  CV issues noted.    --Follow up with PCP or podiatry/ortho for  prophylactic antiinflammatory therapy, such as Allopurinol and several others.     -Follow-up in clinic, PCP or emergency department if symptoms persist or worsen       Darra Lis, FNP  [facility]  12/06/2019  8:33 PM

## 2019-12-28 ENCOUNTER — Encounter: Payer: Self-pay | Admitting: Nephrology

## 2019-12-28 DIAGNOSIS — N1832 Chronic kidney disease, stage 3b: Secondary | ICD-10-CM

## 2020-01-10 ENCOUNTER — Ambulatory Visit: Payer: Medicare Other

## 2020-01-20 ENCOUNTER — Other Ambulatory Visit: Payer: Self-pay | Admitting: Cardiovascular Disease

## 2020-02-22 ENCOUNTER — Ambulatory Visit
Admission: RE | Admit: 2020-02-22 | Discharge: 2020-02-22 | Disposition: A | Discharge status: Home / Self Care | Payer: Medicare Other | Source: Ambulatory Visit | Attending: Nephrology | Admitting: Nephrology

## 2020-02-22 ENCOUNTER — Inpatient Hospital Stay: Admission: RE | Admit: 2020-02-22 | Payer: Self-pay | Source: Ambulatory Visit

## 2020-02-22 DIAGNOSIS — M109 Gout, unspecified: Secondary | ICD-10-CM | POA: Insufficient documentation

## 2020-02-22 DIAGNOSIS — N189 Chronic kidney disease, unspecified: Secondary | ICD-10-CM | POA: Insufficient documentation

## 2020-02-22 LAB — RENAL FUNCTION PANEL
Albumin: 3.6 gm/dL (ref 3.5–5.0)
Anion Gap: 14.5 mMol/L (ref 7.0–18.0)
BUN / Creatinine Ratio: 16.2 Ratio (ref 10.0–30.0)
BUN: 25 mg/dL — ABNORMAL HIGH (ref 7–22)
CO2: 26 mMol/L (ref 20–30)
Calcium: 9.2 mg/dL (ref 8.5–10.5)
Chloride: 105 mMol/L (ref 98–110)
Creatinine: 1.54 mg/dL — ABNORMAL HIGH (ref 0.80–1.30)
EGFR: 44 mL/min/{1.73_m2} — ABNORMAL LOW (ref 60–150)
Glucose: 95 mg/dL (ref 71–99)
Osmolality Calculated: 285 mOsm/kg (ref 275–300)
Phosphorus: 3.6 mg/dL (ref 2.3–4.7)
Potassium: 4.5 mMol/L (ref 3.5–5.3)
Sodium: 141 mMol/L (ref 136–147)

## 2020-02-22 LAB — PROTEIN, URINE, RANDOM: Urine Protein: 49 mg/dL — ABNORMAL HIGH (ref 0–14)

## 2020-02-22 LAB — HEMOGLOBIN AND HEMATOCRIT, BLOOD
Hematocrit: 38.5 % — ABNORMAL LOW (ref 39.0–52.5)
Hemoglobin: 12.7 gm/dL — ABNORMAL LOW (ref 13.0–17.5)

## 2020-02-22 LAB — CREATININE, URINE, RANDOM: Urine Creatinine Random: 99 mg/dL

## 2020-02-22 LAB — URIC ACID: Uric acid: 4.3 mg/dL (ref 3.5–7.2)

## 2020-02-22 LAB — PTH, INTACT: PTH Intact: 65.7 pg/mL (ref 20.0–83.0)

## 2021-10-22 IMAGING — CR DG FOOT COMPLETE 3+V*R*
3 series · 3 of 3 positions shown · non-contrast
Comparison: None.

CLINICAL DATA: Chronic bilateral foot pain.  History of gout.

EXAM:
RIGHT FOOT COMPLETE - 3+ VIEW; LEFT FOOT - COMPLETE 3+ VIEW

[AP]
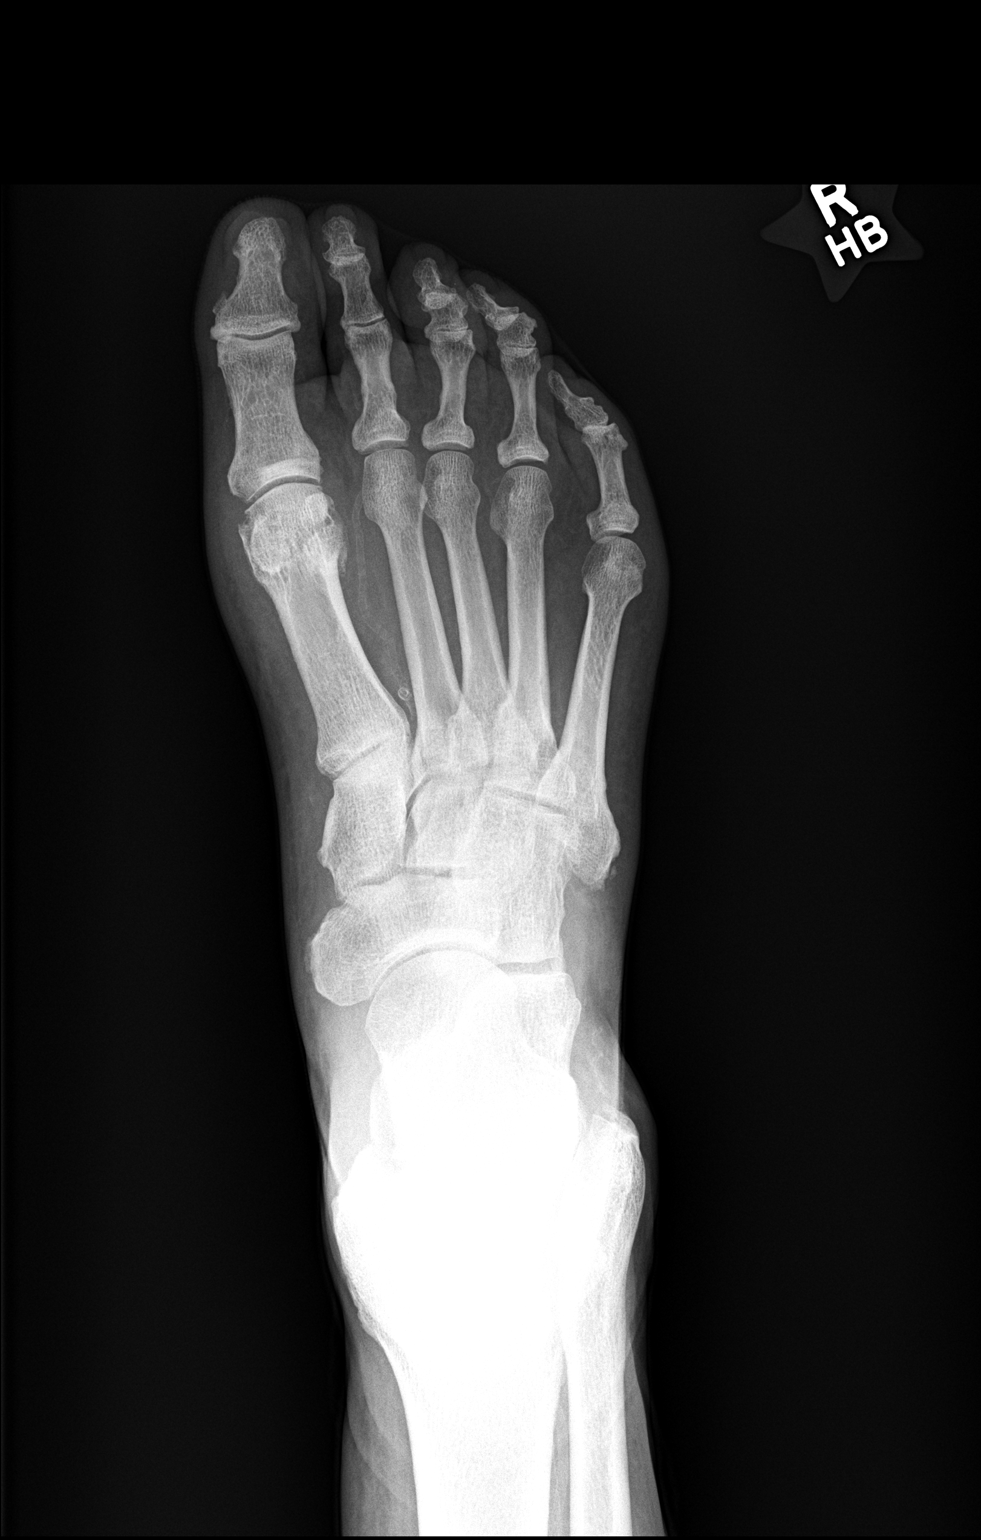

[obl]
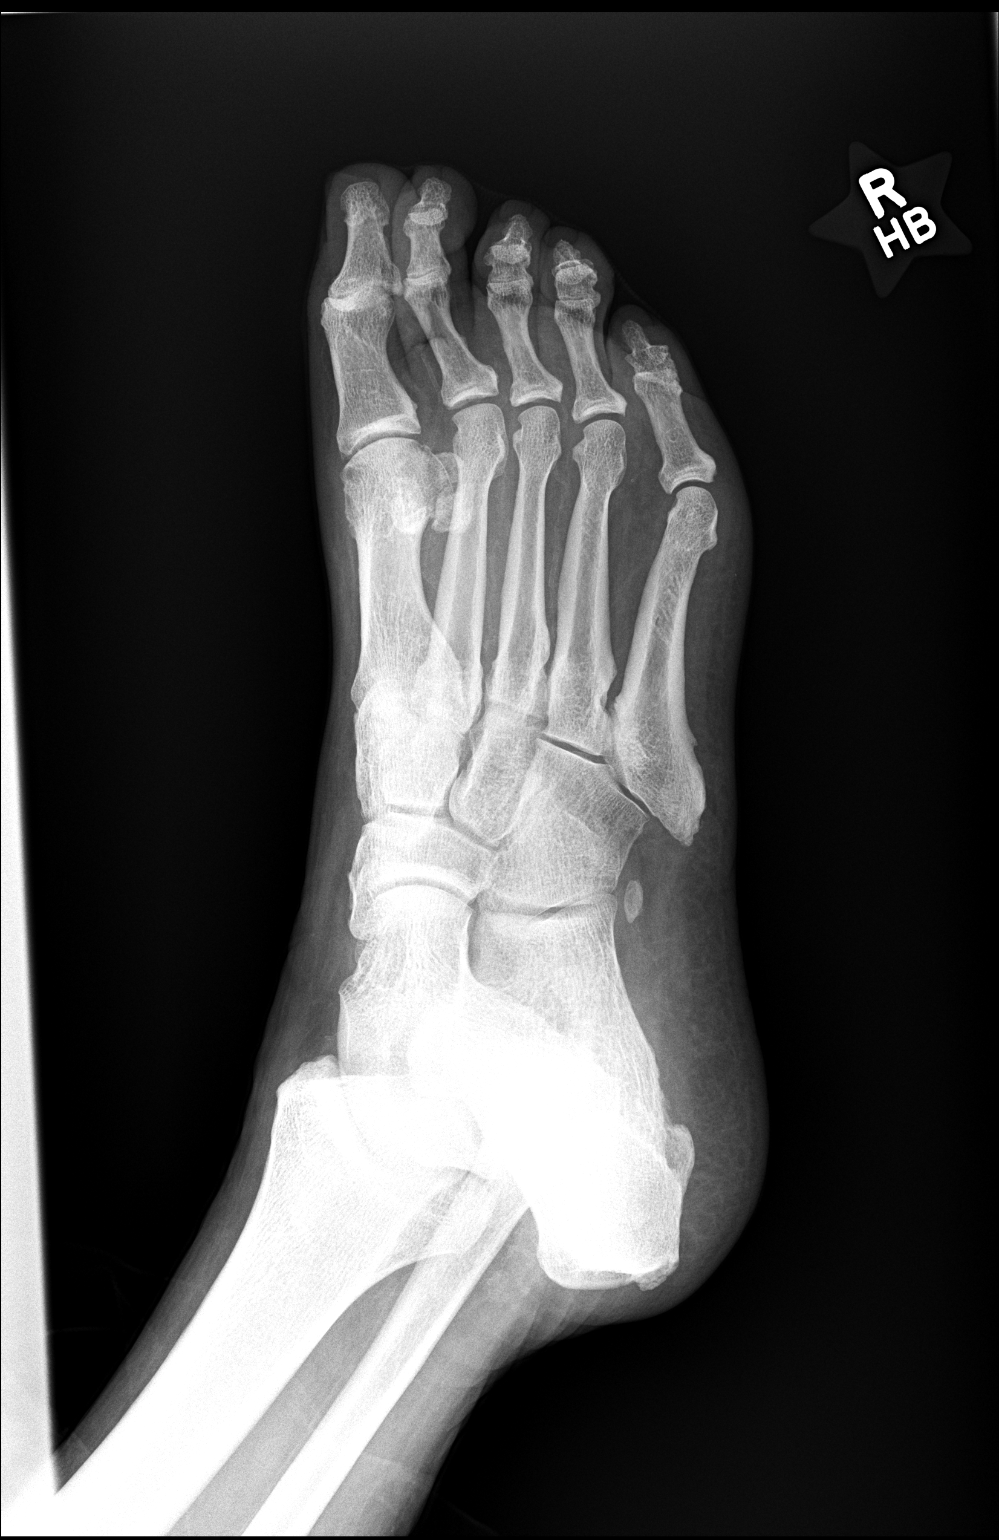

[lateral]
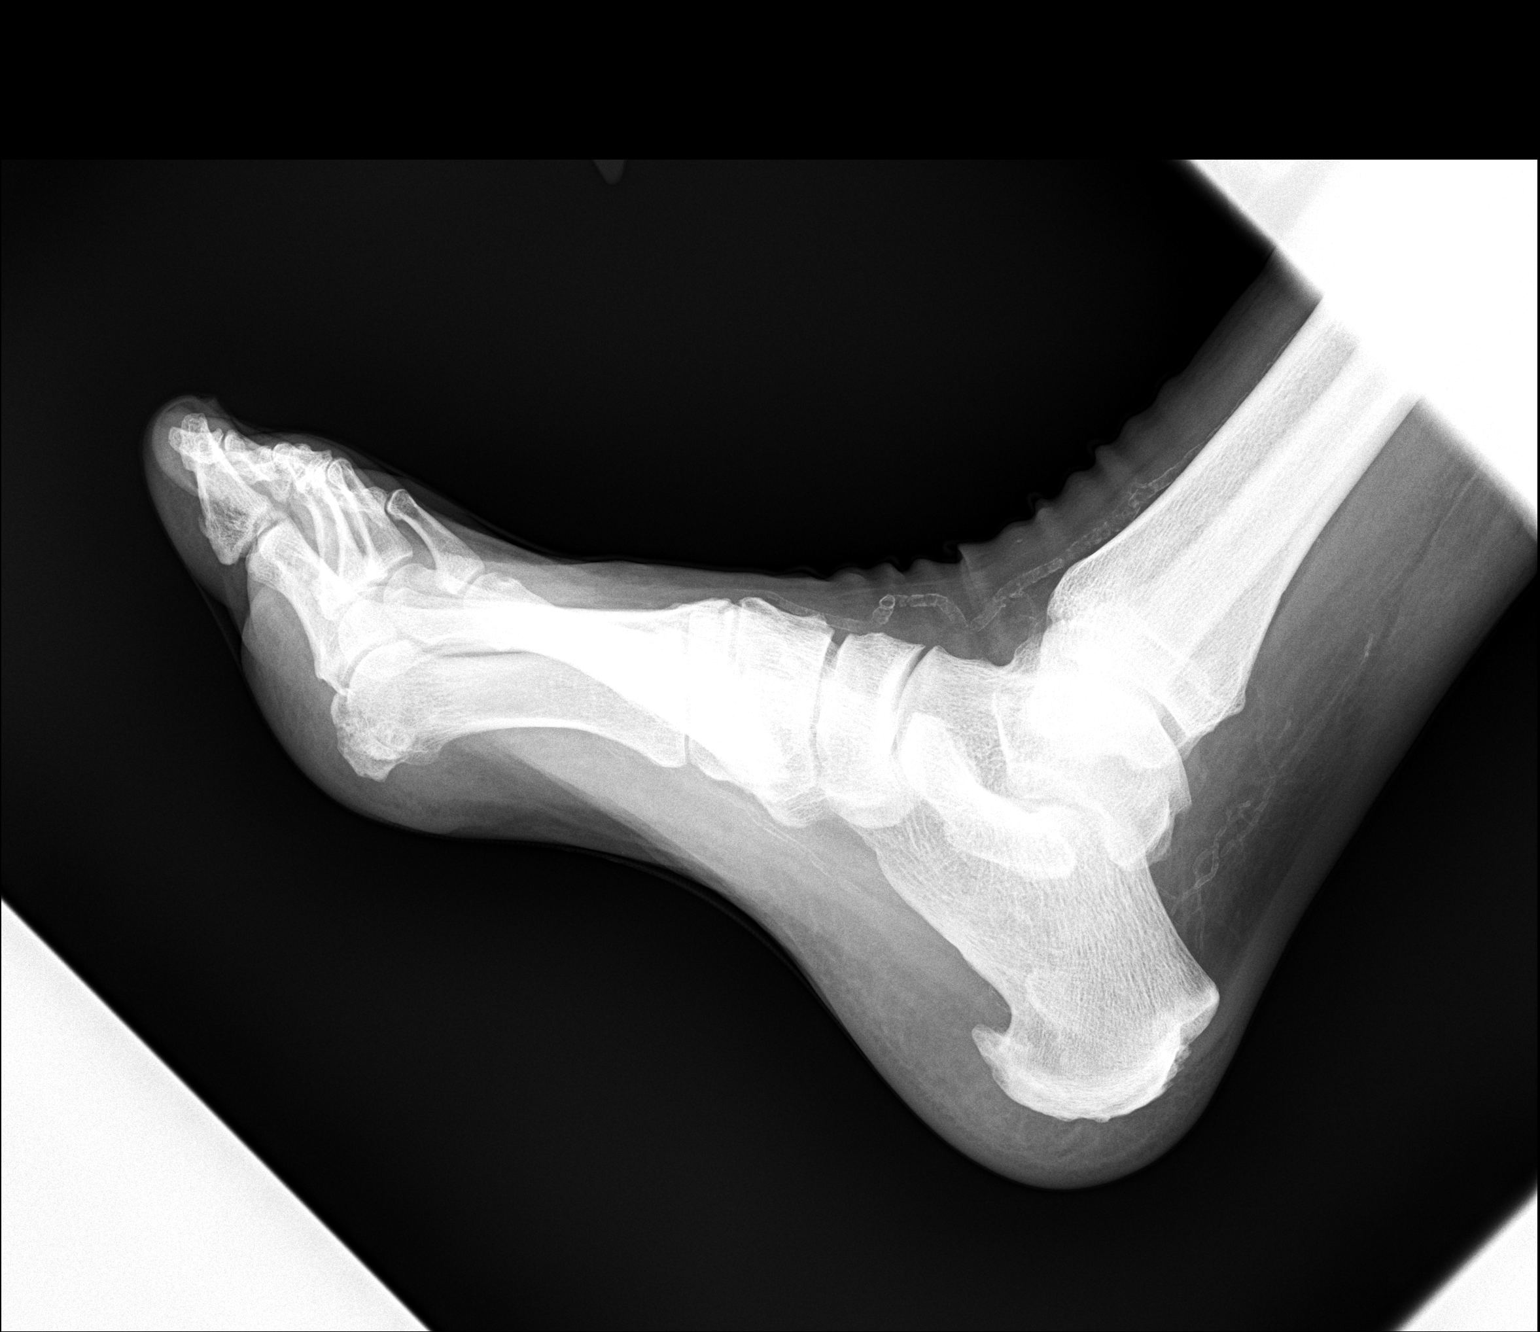

[3 of 3 positions shown; findings below may reference images not displayed]

FINDINGS: Joint spaces are preserved. A small erosion is seen in the periphery
of the right first metatarsal head. No other erosion is identified.
There is some joint space narrowing and osteophytosis about
scattered interphalangeal joints, most notable at the IP joint of
the right great toe. Bilateral plantar calcaneal spurring is
present. Soft tissues demonstrate atherosclerosis.
IMPRESSION: Small erosion in the periphery of the right first metatarsal head
has an appearance most compatible with changes related to gout.

Scattered mild appearing interphalangeal joint osteoarthritis.

Bilateral calcaneal spurring.

Atherosclerosis.

## 2021-10-22 IMAGING — CR DG FOOT COMPLETE 3+V*L*
3 series · 3 of 3 positions shown · non-contrast
Comparison: None.

CLINICAL DATA: Chronic bilateral foot pain.  History of gout.

EXAM:
RIGHT FOOT COMPLETE - 3+ VIEW; LEFT FOOT - COMPLETE 3+ VIEW

[AP]
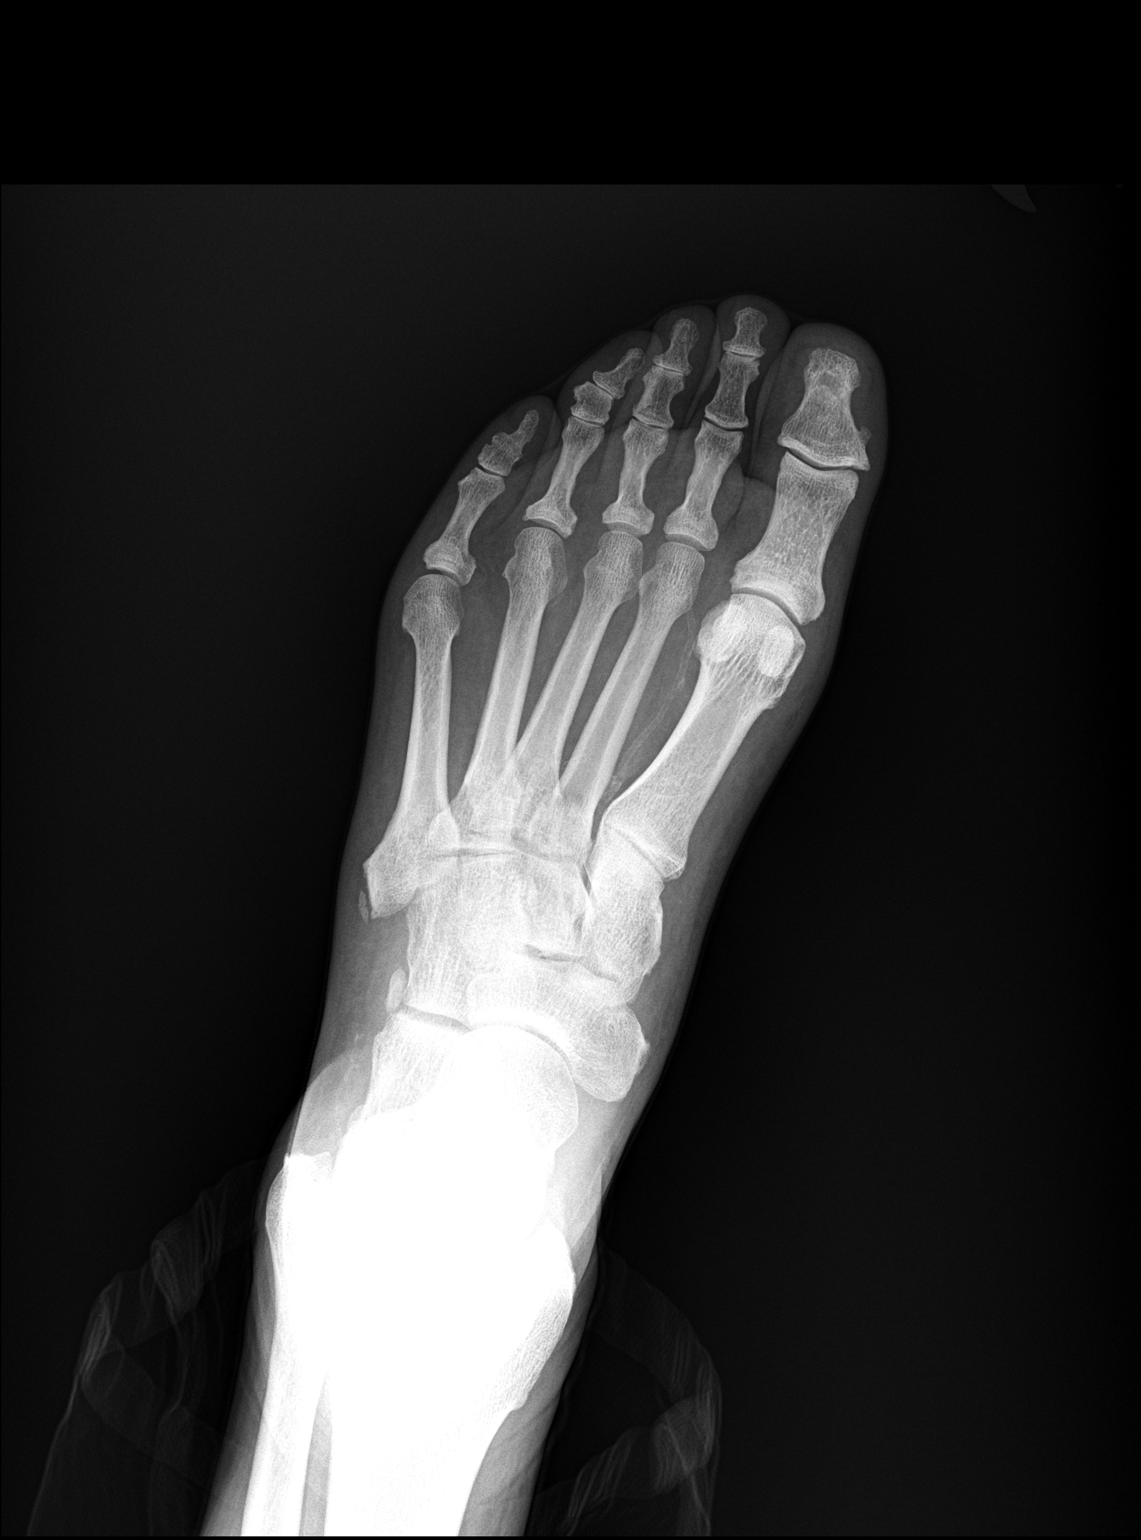

[lateral]
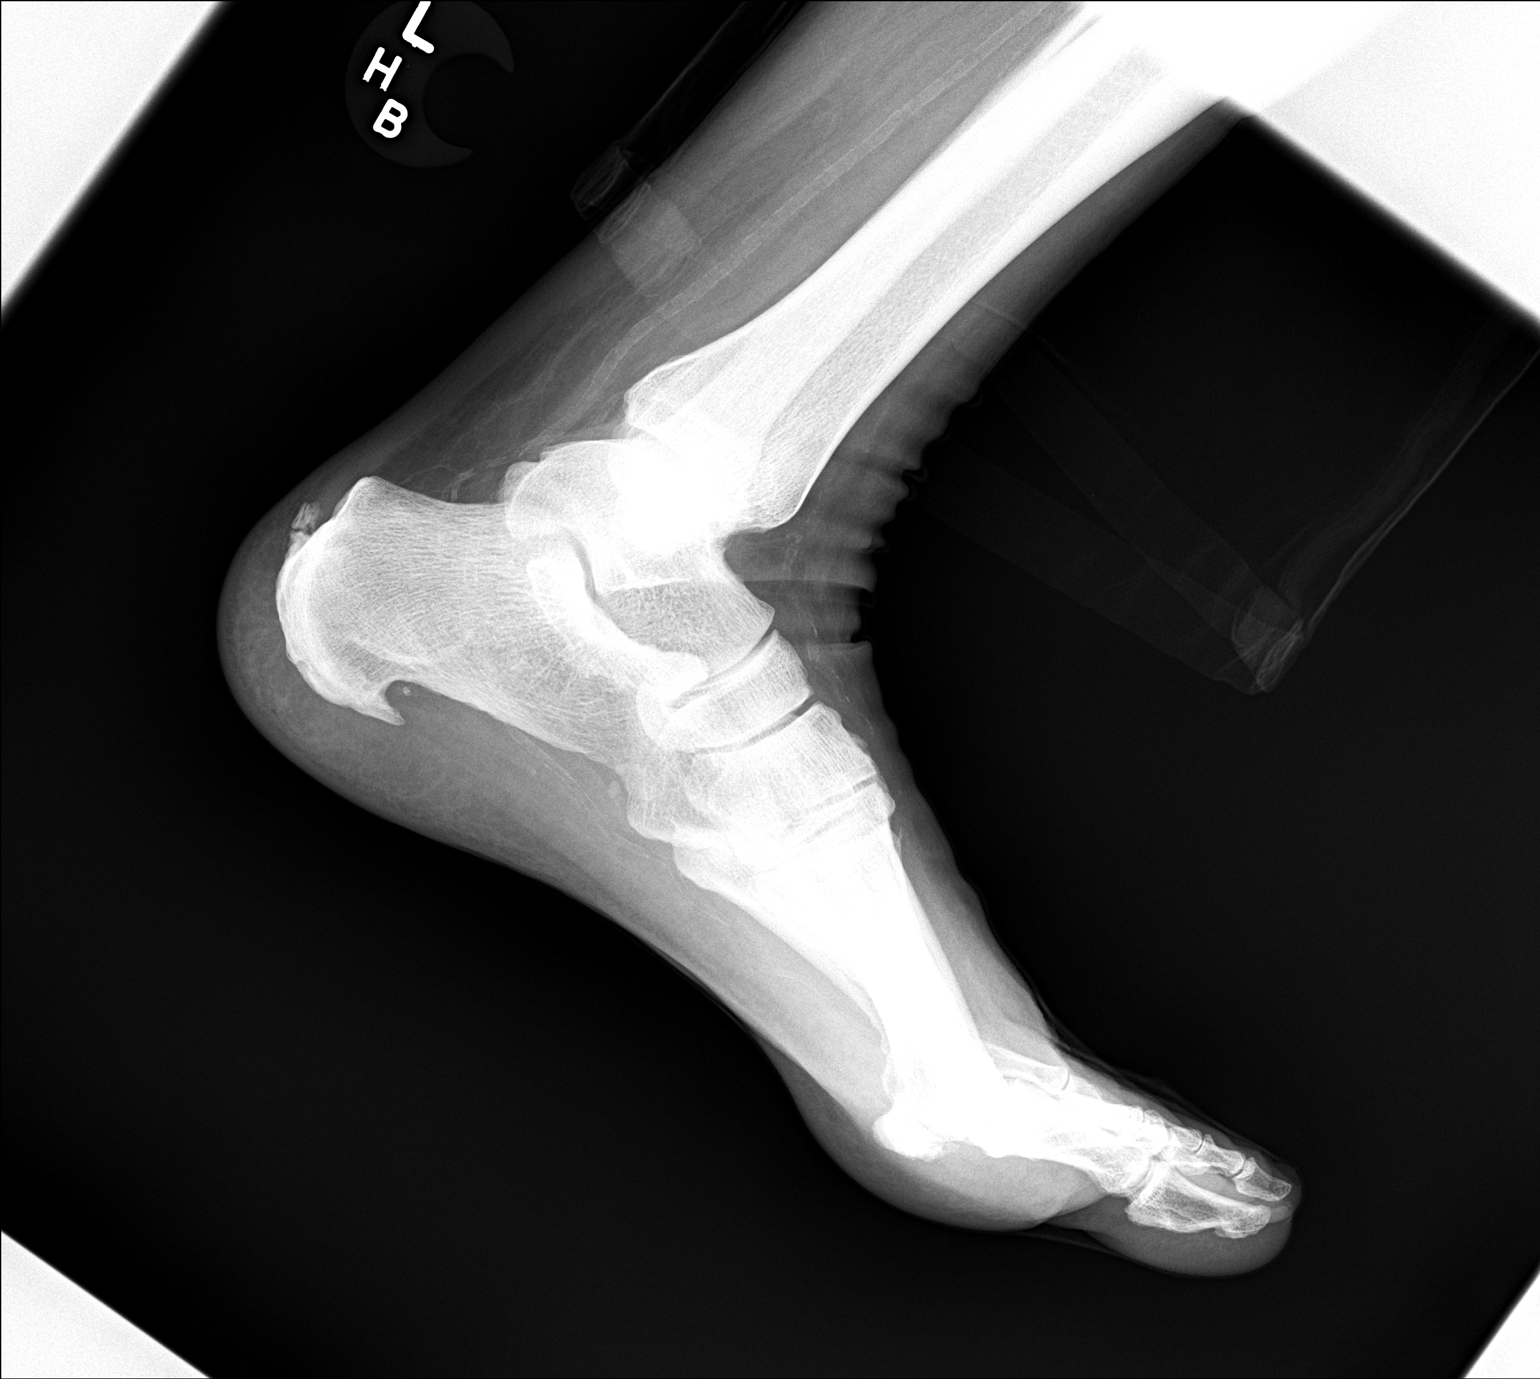

[obl]
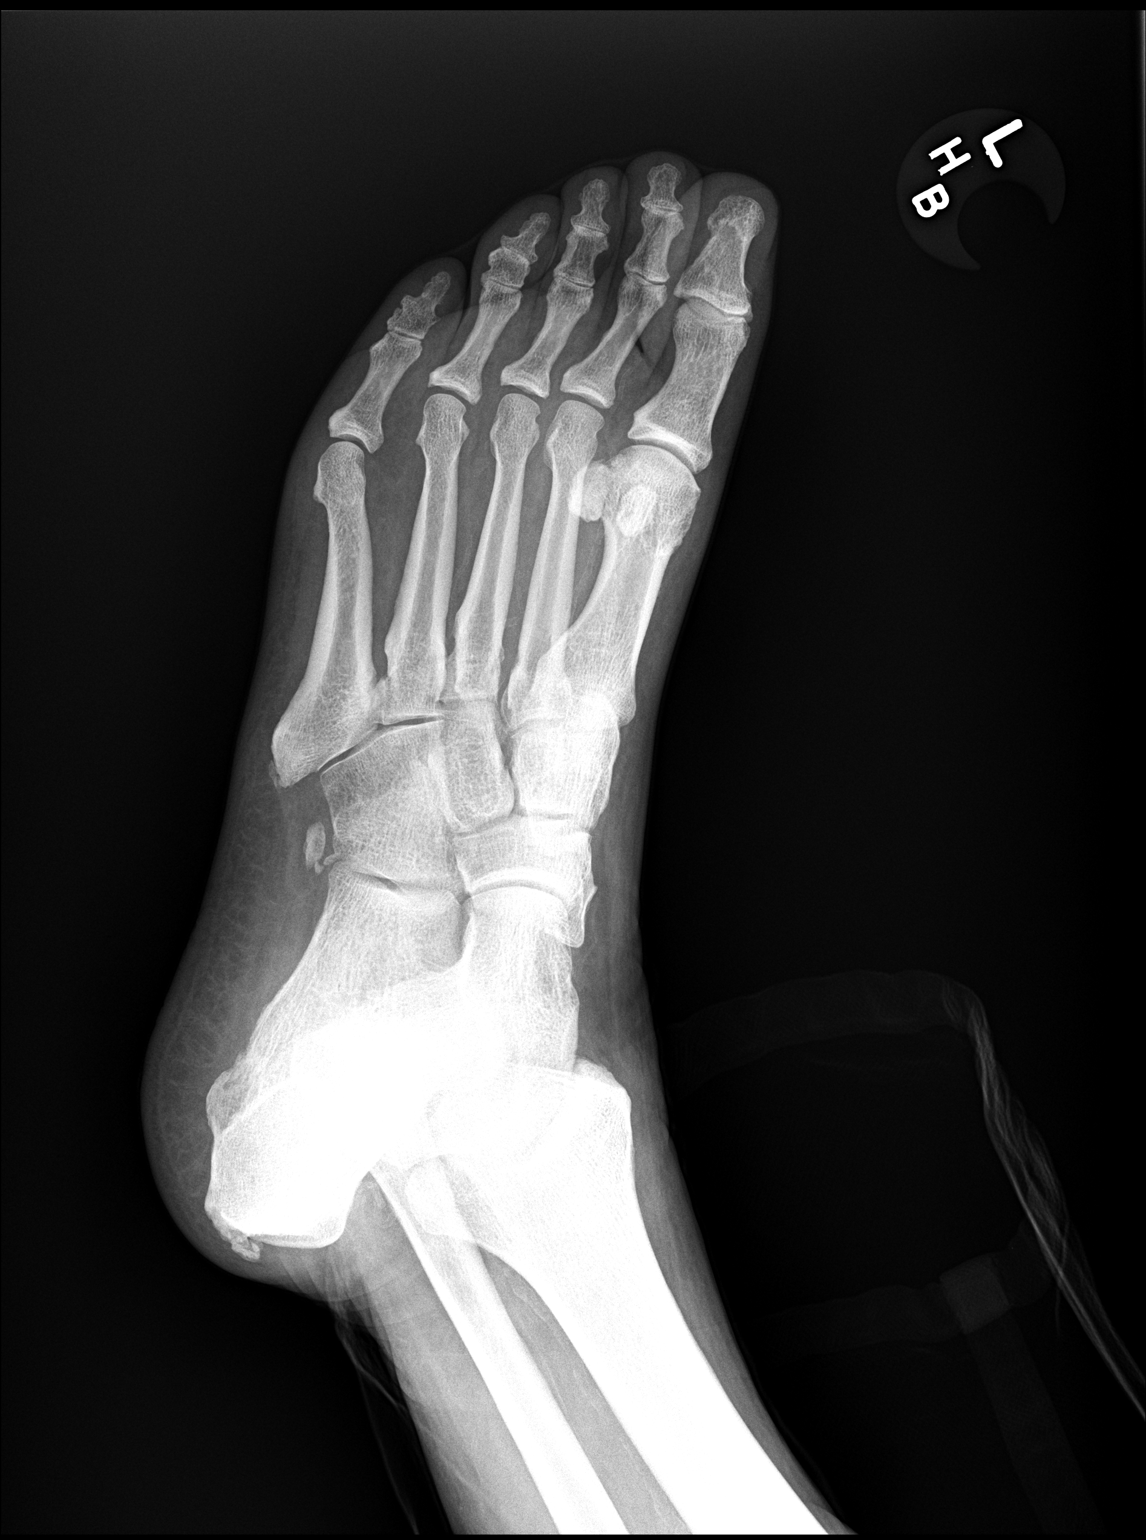

[3 of 3 positions shown; findings below may reference images not displayed]

FINDINGS: Joint spaces are preserved. A small erosion is seen in the periphery
of the right first metatarsal head. No other erosion is identified.
There is some joint space narrowing and osteophytosis about
scattered interphalangeal joints, most notable at the IP joint of
the right great toe. Bilateral plantar calcaneal spurring is
present. Soft tissues demonstrate atherosclerosis.
IMPRESSION: Small erosion in the periphery of the right first metatarsal head
has an appearance most compatible with changes related to gout.

Scattered mild appearing interphalangeal joint osteoarthritis.

Bilateral calcaneal spurring.

Atherosclerosis.
